# Patient Record
Sex: Female | Born: 1950
Health system: Southern US, Community
[De-identification: ages and names within clinical notes are randomized; demographics above are authoritative.]

## PROBLEM LIST (undated history)

## (undated) DIAGNOSIS — H269 Unspecified cataract: Secondary | ICD-10-CM

## (undated) DIAGNOSIS — I10 Essential (primary) hypertension: Secondary | ICD-10-CM

## (undated) DIAGNOSIS — K52839 Microscopic colitis, unspecified: Secondary | ICD-10-CM

## (undated) DIAGNOSIS — E785 Hyperlipidemia, unspecified: Secondary | ICD-10-CM

## (undated) DIAGNOSIS — C649 Malignant neoplasm of unspecified kidney, except renal pelvis: Secondary | ICD-10-CM

## (undated) DIAGNOSIS — K219 Gastro-esophageal reflux disease without esophagitis: Secondary | ICD-10-CM

## (undated) DIAGNOSIS — M7730 Calcaneal spur, unspecified foot: Secondary | ICD-10-CM

## (undated) DIAGNOSIS — M1990 Unspecified osteoarthritis, unspecified site: Secondary | ICD-10-CM

## (undated) DIAGNOSIS — M109 Gout, unspecified: Secondary | ICD-10-CM

## (undated) HISTORY — DX: Essential (primary) hypertension: I10

## (undated) HISTORY — DX: Hyperlipidemia, unspecified: E78.5

## (undated) HISTORY — PX: JOINT REPLACEMENT: SHX530

## (undated) HISTORY — PX: HERNIA REPAIR: SHX51

## (undated) HISTORY — PX: COSMETIC SURGERY: SHX468

## (undated) HISTORY — PX: OTHER SURGICAL HISTORY: SHX169

## (undated) HISTORY — DX: Unspecified osteoarthritis, unspecified site: M19.90

## (undated) HISTORY — DX: Malignant neoplasm of unspecified kidney, except renal pelvis: C64.9

## (undated) HISTORY — DX: Unspecified cataract: H26.9

## (undated) HISTORY — DX: Gastro-esophageal reflux disease without esophagitis: K21.9

## (undated) HISTORY — PX: BREAST SURGERY: SHX581

## (undated) HISTORY — DX: Microscopic colitis, unspecified: K52.839

## (undated) HISTORY — PX: APPENDECTOMY: SHX54

## (undated) HISTORY — PX: NEPHRECTOMY: SHX65

## (undated) HISTORY — DX: Gout, unspecified: M10.9

## (undated) HISTORY — PX: COLONOSCOPY: SHX174

## (undated) HISTORY — PX: UPPER GASTROINTESTINAL ENDOSCOPY: SHX188

## (undated) HISTORY — PX: EYE SURGERY: SHX253

## (undated) HISTORY — DX: Calcaneal spur, unspecified foot: M77.30

## (undated) HISTORY — PX: TUBAL LIGATION: SHX77

---
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
## 2001-07-07 ENCOUNTER — Inpatient Hospital Stay (HOSPITAL_COMMUNITY): Admission: AD | Admit: 2001-07-07 | Discharge: 2001-07-08 | Payer: Self-pay | Admitting: *Deleted

## 2008-09-14 DIAGNOSIS — C649 Malignant neoplasm of unspecified kidney, except renal pelvis: Secondary | ICD-10-CM

## 2008-09-14 HISTORY — DX: Malignant neoplasm of unspecified kidney, except renal pelvis: C64.9

## 2009-06-19 ENCOUNTER — Inpatient Hospital Stay (HOSPITAL_COMMUNITY): Admission: RE | Admit: 2009-06-19 | Discharge: 2009-06-21 | Payer: Self-pay | Admitting: Urology

## 2009-06-19 ENCOUNTER — Encounter: Payer: Self-pay | Admitting: Urology

## 2009-09-18 ENCOUNTER — Ambulatory Visit (HOSPITAL_COMMUNITY): Admission: RE | Admit: 2009-09-18 | Discharge: 2009-09-18 | Payer: Self-pay | Admitting: Urology

## 2009-12-18 ENCOUNTER — Ambulatory Visit (HOSPITAL_COMMUNITY): Admission: RE | Admit: 2009-12-18 | Discharge: 2009-12-18 | Payer: Self-pay | Admitting: Urology

## 2010-07-10 ENCOUNTER — Ambulatory Visit (HOSPITAL_COMMUNITY): Admission: RE | Admit: 2010-07-10 | Discharge: 2010-07-10 | Payer: Self-pay | Admitting: General Surgery

## 2010-11-26 LAB — DIFFERENTIAL
Basophils Absolute: 0.1 10*3/uL (ref 0.0–0.1)
Eosinophils Absolute: 0.1 10*3/uL (ref 0.0–0.7)
Eosinophils Relative: 2 % (ref 0–5)
Monocytes Absolute: 0.5 10*3/uL (ref 0.1–1.0)

## 2010-11-26 LAB — COMPREHENSIVE METABOLIC PANEL
ALT: 56 U/L — ABNORMAL HIGH (ref 0–35)
AST: 43 U/L — ABNORMAL HIGH (ref 0–37)
Albumin: 3.8 g/dL (ref 3.5–5.2)
Alkaline Phosphatase: 58 U/L (ref 39–117)
CO2: 29 mEq/L (ref 19–32)
Chloride: 104 mEq/L (ref 96–112)
GFR calc Af Amer: 54 mL/min — ABNORMAL LOW (ref >60)
GFR calc non Af Amer: 45 mL/min — ABNORMAL LOW (ref >60)
Potassium: 3.2 mEq/L — ABNORMAL LOW (ref 3.5–5.1)
Sodium: 138 mEq/L (ref 135–145)
Total Bilirubin: 0.9 mg/dL (ref 0.3–1.2)

## 2010-11-26 LAB — SURGICAL PCR SCREEN: Staphylococcus aureus: NEGATIVE

## 2010-11-26 LAB — CBC
Hemoglobin: 13.4 g/dL (ref 12.0–15.0)
MCH: 32.1 pg (ref 26.0–34.0)
Platelets: 259 10*3/uL (ref 150–400)
RBC: 4.17 MIL/uL (ref 3.87–5.11)
WBC: 6.3 10*3/uL (ref 4.0–10.5)

## 2010-12-18 LAB — BASIC METABOLIC PANEL
BUN: 10 mg/dL (ref 6–23)
Chloride: 104 mEq/L (ref 96–112)
Creatinine, Ser: 1.24 mg/dL — ABNORMAL HIGH (ref 0.4–1.2)
GFR calc Af Amer: 54 mL/min — ABNORMAL LOW (ref >60)
GFR calc non Af Amer: 44 mL/min — ABNORMAL LOW (ref >60)
Potassium: 4.1 mEq/L (ref 3.5–5.1)

## 2010-12-18 LAB — HEMOGLOBIN AND HEMATOCRIT, BLOOD
HCT: 33.5 % — ABNORMAL LOW (ref 36.0–46.0)
HCT: 35.3 % — ABNORMAL LOW (ref 36.0–46.0)
Hemoglobin: 11.7 g/dL — ABNORMAL LOW (ref 12.0–15.0)
Hemoglobin: 12.1 g/dL (ref 12.0–15.0)

## 2010-12-18 LAB — TYPE AND SCREEN: Antibody Screen: NEGATIVE

## 2010-12-19 LAB — CBC
Platelets: 251 10*3/uL (ref 150–400)
RDW: 13.9 % (ref 11.5–15.5)
WBC: 6 10*3/uL (ref 4.0–10.5)

## 2010-12-19 LAB — BASIC METABOLIC PANEL
BUN: 13 mg/dL (ref 6–23)
Calcium: 9.9 mg/dL (ref 8.4–10.5)
GFR calc non Af Amer: >60 mL/min (ref >60)
Glucose, Bld: 116 mg/dL — ABNORMAL HIGH (ref 70–99)
Potassium: 3.4 mEq/L — ABNORMAL LOW (ref 3.5–5.1)

## 2011-01-30 NOTE — Discharge Summary (Signed)
Kristie Lewis. [facility]  Patient:    Kristie Lewis, Kristie Lewis Visit Number: [id number] MRN: [id number]          Service Type: MED Location: [phone] Attending Physician:  Veneda Melter Dictated by:   Brita Romp, P.A. Admit Date:  07/07/2001 Discharge Date: 07/08/2001   CC:         M. Linna Darner, M.D., [place], [place]  [facility]   Discharge Summary  DISCHARGE DIAGNOSES: 1. Chest pain, status post cardiac catheterization. 2. Hyperlipidemia. 3. Labile hypertension. 4. History of trigeminy, treated with atenolol.  HISTORY OF PRESENT ILLNESS:  Kristie Lewis is a 60 year old female who was initially admitted by Jonelle Sidle, M.D., on July 06, 2001.  She subs had a stress test which was abnormal and she was referred to [facility]. [facility] for catheterization.  HOSPITAL COURSE:  On July 07, 2001, the patient was seen by Veneda Melter, M.D.  She had no chest pain or shortness of breath, as well as no nausea or vomiting.  In addition to the scheduled catheterization, he planned to recheck the patients lipids and replete her hypokalemia of 3.3.  The next day, the patient was taken to the catheterization laboratory by Veneda Melter, M.D.  Catheterization results:  1. Left main coronary artery:  Mild disease.  2. Left anterior descending:  Bifurcating D sub 1, small D sub 2, 10-20% lesion in the proximal to mid vessel.  3. Left circumflex:  Two x OM, 20% in the AV circumflex.  4. Right coronary artery:  Dominant; luminal irregularities.  5. Left ventricle:  Ejection fraction greater than 65%, no wall motion abnormalities or mitral regurgitation and lift.  Veneda Melter, M.D., felt the patients chest pain was noncoronary in origin and recommended further investigation for other causes of chest pain.  He noted that morning that her potassium was 3.1 and ordered additional repletion.  Following repletion, he felt that she was stable for discharge with  close follow-up by her primary care physician.  DISCHARGE MEDICATIONS: 1. Zocor 20 mg q.h.s. 2. Enteric-coated aspirin 325 mg q.d. 3. Atenolol as previously taken.  ACTIVITY:  The patient is to avoid driving, heavy lifting, or tub baths for 48 hours.  DIET:  She is to follow a low-fat, low-salt, low-cholesterol diet.  WOUND CARE:  She is to watch the catheterization site for any pain, bleeding, or swelling and to call the [place] office for any of these problems.  FOLLOW-UP:  She is to have a BMET drawn on Monday, July 11, 2001, with results going to M. Linna Darner, M.D.  She is to follow up with Dr. Linna Darner as needed or as scheduled.  LABORATORY VALUES:  Sodium 138, potassium pre repletion 3.1, chloride 102, CO2 28, BUN 11, creatinine 1.0, glucose 102. Dictated by:   Brita Romp, P.A. Attending Physician:  Veneda Melter DD:  07/08/01 TD:  07/11/01 Job: [id number]

## 2011-01-30 NOTE — Discharge Summary (Signed)
[place]. [facility]  Patient:    Kristie Lewis, Kristie Lewis Visit Number: [id number] MRN: [id number]          Service Type: MED Location: [phone] Attending Physician:  Veneda Melter Dictated by:   Brita Romp, P.A.C. Admit Date:  07/07/2001 Discharge Date: 07/08/2001                             Discharge Summary  ADDENDUM TO LAB RESULTS ON DISCHARGE SUMMARY #[id number]:  Fasting lipid panel was pending at time of discharge.  This will need to be retrieved from the [facility] system on or after July 09, 2001. Dictated by:   Brita Romp, P.A.C. Attending Physician:  Veneda Melter DD:  07/08/01 TD:  07/11/01 Job: [id number]

## 2011-01-30 NOTE — Cardiovascular Report (Signed)
[place]. [facility]  Patient:    Kristie Lewis, Kristie Lewis Visit Number: [id number] MRN: [id number]          Service Type: MED Location: [phone] Attending Physician:  Veneda Melter Dictated by:   Veneda Melter, M.D. Proc. Date: 07/08/01 Admit Date:  07/07/2001 Discharge Date: 07/08/2001   CC:         Lia Hopping, M.D., [facility]   Cardiac Catheterization  PROCEDURE: 1. Left heart catheterization 2. Left ventriculogram. 3. Selective coronary angiography. 4. Abdominal aortogram. 5. Perclose of right femoral artery.  DIAGNOSES: 1. Trivial coronary artery disease by angiogram. 2. Normal left ventricular systolic function.  CARDIOLOGIST:  Veneda Melter, M.D.  HISTORY:  Kristie Lewis is a 60 year old white female who presents with substernal chest discomfort.  The patient underwent stress test suggesting ischemia.  The patient had pain at rest and was admitted to the hospital.  She subsequently ruled out for acute myocardial infarction.  She was transferred from [facility] to [facility] for further cardiac assessment.  TECHNIQUE:  After informed consent was obtained, the patient was brought to the catheterization lab.  A 6-French sheath was placed in the right femoral artery.  Left heart catheterization and selective coronary angiography was then performed in the usual fashion using preformed 6-French Judkins catheters.  Left ventricular and abdominal aortogram were performed using power injection of contrast with pigtail catheter. At the termination of the case, a Perclose suture closure device was employed to the right femoral artery until adequate hemostasis was achieved.  The patient tolerated the procedure well and was transferred to the floor in stable condition.  FINDINGS: 1. Left main trunk: A medium caliber vessel with mild irregularities. 2. LAD.  This is a medium caliber vessel that provides a large    bifurcating first diagonal branch in the proximal  segment, a second    diagonal branch in the mid section.  There were mild irregularities of    10 to 20% in the proximal and mid LAD. 3. Left circumflex artery: This is a medium caliber vessel that provides a    first marginal branch at the proximal segment and a small second marginal    branch in the mid section.  There is mild disease of 20% in the A-V    circumflex. 4. Right coronary artery is dominant.  This is a medium caliber vessel that    provides a posterior descending artery and two posterior ventricular    branches in its terminal segment.   There are luminal irregularities in    the right coronary artery system.  LEFT VENTRICULOGRAPHY:  Normal end-systolic and end-diastolic dimensions. Overall left ventricular function well preserved, ejection fraction greater than 65%.  No mitral regurgitation.  LV pressure 100/0, aortic 100/60.  LV EDP equals 4.  ABDOMINAL AORTOGRAPHY: Abdominal aorta of normal caliber without significant atheromatous buildup.  The renal arteries are single and widely patent bilaterally.  The iliac arteries have no obstructive disease.  ASSESSMENT/PLAN:  Kristie Lewis is a 60 year old white female with noncritical coronary artery disease and well preserved left ventricular function. Continued medical therapy and aggressive risk factor modification will be pursued and other causes of chest pain investigated. Dictated by:   Veneda Melter, M.D. Attending Physician:  Veneda Melter DD:  07/08/01 TD:  07/10/01 Job: [id number]

## 2012-12-08 ENCOUNTER — Other Ambulatory Visit: Payer: Self-pay

## 2012-12-08 MED ORDER — POTASSIUM CHLORIDE CRYS ER 20 MEQ PO TBCR
20.0000 meq | EXTENDED_RELEASE_TABLET | Freq: Every day | ORAL | Status: DC
Start: 2012-12-08 — End: 2013-01-23

## 2012-12-09 ENCOUNTER — Telehealth: Payer: Self-pay | Admitting: Nurse Practitioner

## 2012-12-09 NOTE — Telephone Encounter (Signed)
[facility] was e-scribed to [facility] on 12/08/12. Pt aware

## 2013-01-02 ENCOUNTER — Other Ambulatory Visit: Payer: Self-pay

## 2013-01-02 MED ORDER — FUROSEMIDE 20 MG PO TABS: 20.0000 mg | ORAL_TABLET | Freq: Every day | ORAL | Status: DC

## 2013-01-23 ENCOUNTER — Ambulatory Visit (INDEPENDENT_AMBULATORY_CARE_PROVIDER_SITE_OTHER): Payer: BC Managed Care – PPO | Admitting: Nurse Practitioner

## 2013-01-23 ENCOUNTER — Encounter: Payer: Self-pay | Admitting: Nurse Practitioner

## 2013-01-23 VITALS — BP 116/60 | HR 56 | Temp 97.4°F | Ht 64.0 in | Wt 208.0 lb

## 2013-01-23 DIAGNOSIS — E785 Hyperlipidemia, unspecified: Secondary | ICD-10-CM | POA: Insufficient documentation

## 2013-01-23 DIAGNOSIS — E876 Hypokalemia: Secondary | ICD-10-CM | POA: Insufficient documentation

## 2013-01-23 DIAGNOSIS — K219 Gastro-esophageal reflux disease without esophagitis: Secondary | ICD-10-CM | POA: Insufficient documentation

## 2013-01-23 DIAGNOSIS — I10 Essential (primary) hypertension: Secondary | ICD-10-CM

## 2013-01-23 DIAGNOSIS — E1159 Type 2 diabetes mellitus with other circulatory complications: Secondary | ICD-10-CM | POA: Insufficient documentation

## 2013-01-23 MED ORDER — ATENOLOL 50 MG PO TABS
50.0000 mg | ORAL_TABLET | Freq: Every day | ORAL | Status: DC
Start: 2013-01-23 — End: 2013-07-28

## 2013-01-23 MED ORDER — POTASSIUM CHLORIDE CRYS ER 20 MEQ PO TBCR
20.0000 meq | EXTENDED_RELEASE_TABLET | Freq: Every day | ORAL | Status: DC
Start: 2013-01-23 — End: 2013-07-28

## 2013-01-23 MED ORDER — FUROSEMIDE 20 MG PO TABS: 20.0000 mg | ORAL_TABLET | Freq: Every day | ORAL | Status: DC

## 2013-01-23 MED ORDER — PANTOPRAZOLE SODIUM 40 MG PO TBEC: 40.0000 mg | DELAYED_RELEASE_TABLET | Freq: Every day | ORAL | Status: DC

## 2013-01-23 MED ORDER — PITAVASTATIN CALCIUM 2 MG PO TABS: 2.0000 mg | ORAL_TABLET | Freq: Every day | ORAL | Status: DC

## 2013-01-23 NOTE — Progress Notes (Signed)
Subjective:    Patient ID: Kristie Lewis, female    DOB: Mar 09, 1951, 62 y.o.   MRN: [id number]  Hypertension This is a chronic problem. The current episode started more than 1 year ago. The problem is unchanged. The problem is controlled. Pertinent negatives include no anxiety (has resolved , patient has retired.). There are no associated agents to hypertension. Risk factors for coronary artery disease include dyslipidemia, post-menopausal state and obesity. Past treatments include beta blockers and diuretics. The current treatment provides significant improvement. Compliance problems include diet and exercise.   Hyperlipidemia This is a chronic problem. The current episode started more than 1 year ago. The problem is controlled. Recent lipid tests were reviewed and are normal. Exacerbating diseases include obesity. Factors aggravating her hyperlipidemia include beta blockers. Pertinent negatives include no focal sensory loss, focal weakness, leg pain or myalgias. The current treatment provides significant improvement of lipids. There are no compliance problems.  Risk factors for coronary artery disease include post-menopausal, obesity and hypertension.  Gastrophageal Reflux She reports no choking, no coughing, no dysphagia, no heartburn or no tooth decay. This is a chronic problem. The current episode started more than 1 year ago. The problem occurs rarely. The problem has been waxing and waning. Nothing aggravates the symptoms. Pertinent negatives include no fatigue, muscle weakness or orthopnea. Risk factors include obesity. She has tried a PPI for the symptoms. The treatment provided no relief.  Hypokalemia Potassium daily- No c/o lower ext cramping Peripheral edema Lasix works well too keep swelling under control    Review of Systems  Constitutional: Negative for fatigue.  Respiratory: Negative for cough and choking.   Gastrointestinal: Negative for heartburn and dysphagia.   Musculoskeletal: Negative for myalgias and muscle weakness.  Neurological: Negative for focal weakness.  All other systems reviewed and are negative.       Objective:   Physical Exam  Constitutional: She is oriented to person, place, and time. She appears well-developed and well-nourished.  HENT:  Nose: Nose normal.  Mouth/Throat: Oropharynx is clear and moist.  Eyes: EOM are normal.  Neck: Trachea normal, normal range of motion and full passive range of motion without pain. Neck supple. No JVD present. Carotid bruit is not present. No thyromegaly present.  Cardiovascular: Normal rate, regular rhythm, normal heart sounds and intact distal pulses.  Exam reveals no gallop and no friction rub.   No murmur heard. Pulmonary/Chest: Effort normal and breath sounds normal.  Abdominal: Soft. Bowel sounds are normal. She exhibits no distension and no mass. There is no tenderness.  Musculoskeletal: Normal range of motion.  Lymphadenopathy:    She has no cervical adenopathy.  Neurological: She is alert and oriented to person, place, and time. She has normal reflexes.  Skin: Skin is warm and dry.  Psychiatric: She has a normal mood and affect. Her behavior is normal. Judgment and thought content normal.   BP 116/60  Pulse 56  Temp(Src) 97.4 F (36.3 C) (Oral)  Ht 5\' 4"  (1.626 m)  Wt 208 lb (94.348 kg)  BMI 35.69 kg/m2        Assessment & Plan:  1. Hypertension Low NA+ diet - furosemide (LASIX) 20 MG tablet; Take 1 tablet (20 mg total) by mouth daily.  Dispense: 90 tablet; Refill: 3 - atenolol (TENORMIN) 50 MG tablet; Take 1 tablet (50 mg total) by mouth daily.  Dispense: 90 tablet; Refill: 3  2. Hyperlipidemia Low fat diet an dexercise - Pitavastatin Calcium (LIVALO) 2 MG TABS; Take 1 tablet (  2 mg total) by mouth daily. Takes 1/2qd  Dispense: 90 tablet; Refill: 3  3. GERD (gastroesophageal reflux disease) Avoid spicy and fatty foods - pantoprazole (PROTONIX) 40 MG tablet; Take  1 tablet (40 mg total) by mouth daily.  Dispense: 90 tablet; Refill: 3  4. Hypokalemia  - potassium chloride SA (K-DUR,KLOR-CON) 20 MEQ tablet; Take 1 tablet (20 mEq total) by mouth daily.  Dispense: 90 tablet; Refill: 3  Labs reviewed at appointment  Mary-Margaret Daphine Deutscher, FNP

## 2013-01-23 NOTE — Patient Instructions (Signed)

## 2013-07-28 ENCOUNTER — Encounter: Payer: Self-pay | Admitting: Nurse Practitioner

## 2013-07-28 ENCOUNTER — Ambulatory Visit (INDEPENDENT_AMBULATORY_CARE_PROVIDER_SITE_OTHER): Payer: BC Managed Care – PPO | Admitting: Nurse Practitioner

## 2013-07-28 VITALS — BP 106/53 | HR 59 | Temp 98.1°F | Ht 64.0 in | Wt 216.0 lb

## 2013-07-28 DIAGNOSIS — E876 Hypokalemia: Secondary | ICD-10-CM

## 2013-07-28 DIAGNOSIS — K219 Gastro-esophageal reflux disease without esophagitis: Secondary | ICD-10-CM

## 2013-07-28 DIAGNOSIS — E785 Hyperlipidemia, unspecified: Secondary | ICD-10-CM

## 2013-07-28 DIAGNOSIS — I10 Essential (primary) hypertension: Secondary | ICD-10-CM

## 2013-07-28 LAB — POCT CBC
Lymph, poc: 2.3 (ref 0.6–3.4)
MCH, POC: 29.9 pg (ref 27–31.2)
MCHC: 33.2 g/dL (ref 31.8–35.4)
MPV: 7.6 fL (ref 0–99.8)
POC Granulocyte: 3.6 (ref 2–6.9)
POC LYMPH PERCENT: 36.7 %L (ref 10–50)
Platelet Count, POC: 208 10*3/uL (ref 142–424)
RDW, POC: 14 %
WBC: 6.4 10*3/uL (ref 4.6–10.2)

## 2013-07-28 MED ORDER — POTASSIUM CHLORIDE CRYS ER 20 MEQ PO TBCR: 20.0000 meq | EXTENDED_RELEASE_TABLET | Freq: Every day | ORAL | Status: DC

## 2013-07-28 MED ORDER — ATENOLOL 50 MG PO TABS: 50.0000 mg | ORAL_TABLET | Freq: Every day | ORAL | Status: DC

## 2013-07-28 MED ORDER — PANTOPRAZOLE SODIUM 40 MG PO TBEC: 40.0000 mg | DELAYED_RELEASE_TABLET | Freq: Every day | ORAL | Status: DC

## 2013-07-28 MED ORDER — PITAVASTATIN CALCIUM 2 MG PO TABS: 2.0000 mg | ORAL_TABLET | Freq: Every day | ORAL | Status: DC

## 2013-07-28 MED ORDER — FUROSEMIDE 20 MG PO TABS: 20.0000 mg | ORAL_TABLET | Freq: Every day | ORAL | Status: DC

## 2013-07-28 NOTE — Progress Notes (Signed)
Subjective:    Patient ID: Kristie Lewis, female    DOB: Mar 04, 1951, 62 y.o.   MRN: [id number]  Hypertension This is a chronic problem. The current episode started more than 1 year ago. The problem is unchanged. The problem is controlled. Pertinent negatives include no anxiety (has resolved , patient has retired.). There are no associated agents to hypertension. Risk factors for coronary artery disease include dyslipidemia, post-menopausal state and obesity. Past treatments include beta blockers and diuretics. The current treatment provides significant improvement. Compliance problems include diet and exercise.   Hyperlipidemia This is a chronic problem. The current episode started more than 1 year ago. The problem is controlled. Recent lipid tests were reviewed and are normal. Exacerbating diseases include obesity. Factors aggravating her hyperlipidemia include beta blockers. Pertinent negatives include no focal sensory loss, focal weakness, leg pain or myalgias. The current treatment provides significant improvement of lipids. There are no compliance problems.  Risk factors for coronary artery disease include post-menopausal, obesity and hypertension.  Gastrophageal Reflux She reports no choking, no coughing, no dysphagia, no heartburn or no tooth decay. This is a chronic problem. The current episode started more than 1 year ago. The problem occurs rarely. The problem has been waxing and waning. Nothing aggravates the symptoms. Pertinent negatives include no fatigue, muscle weakness or orthopnea. Risk factors include obesity. She has tried a PPI for the symptoms. The treatment provided no relief.  Hypokalemia Potassium daily- No c/o lower ext cramping Peripheral edema Lasix works well too keep swelling under control    Review of Systems  Constitutional: Negative for fatigue.  Respiratory: Negative for cough and choking.   Gastrointestinal: Negative for heartburn and dysphagia.   Musculoskeletal: Negative for muscle weakness and myalgias.  Neurological: Negative for focal weakness.  All other systems reviewed and are negative.       Objective:   Physical Exam  Constitutional: She is oriented to person, place, and time. She appears well-developed and well-nourished.  HENT:  Nose: Nose normal.  Mouth/Throat: Oropharynx is clear and moist.  Eyes: EOM are normal.  Neck: Trachea normal, normal range of motion and full passive range of motion without pain. Neck supple. No JVD present. Carotid bruit is not present. No thyromegaly present.  Cardiovascular: Normal rate, regular rhythm, normal heart sounds and intact distal pulses.  Exam reveals no gallop and no friction rub.   No murmur heard. Pulmonary/Chest: Effort normal and breath sounds normal.  Abdominal: Soft. Bowel sounds are normal. She exhibits no distension and no mass. There is no tenderness.  Musculoskeletal: Normal range of motion.  Lymphadenopathy:    She has no cervical adenopathy.  Neurological: She is alert and oriented to person, place, and time. She has normal reflexes.  Skin: Skin is warm and dry.  Psychiatric: She has a normal mood and affect. Her behavior is normal. Judgment and thought content normal.   BP 106/53  Pulse 59  Temp(Src) 98.1 F (36.7 C) (Oral)  Ht 5\' 4"  (1.626 m)  Wt 216 lb (97.977 kg)  BMI 37.06 kg/m2        Assessment & Plan:   1. Hypertension   2. Hyperlipidemia   3. GERD (gastroesophageal reflux disease)   4. Hypokalemia    Orders Placed This Encounter  Procedures  . CMP14+EGFR  . NMR, lipoprofile  . POCT CBC   Meds ordered this encounter  Medications  . atenolol (TENORMIN) 50 MG tablet    Sig: Take 1 tablet (50 mg total) by mouth  daily.    Dispense:  90 tablet    Refill:  3    Order Specific Question:  Supervising Provider    Answer:  Ernestina Penna [[id number]]  . pantoprazole (PROTONIX) 40 MG tablet    Sig: Take 1 tablet (40 mg total) by mouth daily.     Dispense:  90 tablet    Refill:  3    Order Specific Question:  Supervising Provider    Answer:  Ernestina Penna [[id number]]  . furosemide (LASIX) 20 MG tablet    Sig: Take 1 tablet (20 mg total) by mouth daily.    Dispense:  90 tablet    Refill:  3    Order Specific Question:  Supervising Provider    Answer:  Ernestina Penna [[id number]]  . potassium chloride SA (K-DUR,KLOR-CON) 20 MEQ tablet    Sig: Take 1 tablet (20 mEq total) by mouth daily.    Dispense:  90 tablet    Refill:  3    Order Specific Question:  Supervising Provider    Answer:  Ernestina Penna [[id number]]  . Pitavastatin Calcium (LIVALO) 2 MG TABS    Sig: Take 1 tablet (2 mg total) by mouth daily. Takes 1/2qd    Dispense:  90 tablet    Refill:  3    Order Specific Question:  Supervising Provider    Answer:  Ernestina Penna [[id number]]    Continue all meds Labs pending Diet and exercise encouraged Health maintenance reviewed Follow up in 3 months hemocult cards given  Mary-Margaret Daphine Deutscher, FNP

## 2013-07-28 NOTE — Patient Instructions (Signed)

## 2013-07-30 LAB — CMP14+EGFR
ALT: 36 IU/L — ABNORMAL HIGH (ref 0–32)
AST: 25 IU/L (ref 0–40)
Albumin/Globulin Ratio: 1.7 (ref 1.1–2.5)
CO2: 24 mmol/L (ref 18–29)
Calcium: 9.9 mg/dL (ref 8.6–10.2)
Chloride: 104 mmol/L (ref 97–108)
GFR calc non Af Amer: 48 mL/min/{1.73_m2} — ABNORMAL LOW (ref >59)
Glucose: 126 mg/dL — ABNORMAL HIGH (ref 65–99)
Potassium: 4.9 mmol/L (ref 3.5–5.2)
Sodium: 143 mmol/L (ref 134–144)
Total Protein: 6.7 g/dL (ref 6.0–8.5)

## 2013-07-30 LAB — NMR, LIPOPROFILE
HDL Cholesterol by NMR: 44 mg/dL (ref >=40)
LDL Particle Number: 2388 nmol/L — ABNORMAL HIGH (ref <1000)
LDL Size: 20.5 nm — ABNORMAL LOW (ref >20.5)
Small LDL Particle Number: 1066 nmol/L — ABNORMAL HIGH (ref <=527)

## 2013-08-02 LAB — POCT GLYCOSYLATED HEMOGLOBIN (HGB A1C): Hemoglobin A1C: 5.6

## 2013-08-02 NOTE — Addendum Note (Signed)
Addended by: Lisbeth Ply C on: 08/02/2013 09:05 AM   Modules accepted: Orders

## 2013-10-17 ENCOUNTER — Other Ambulatory Visit: Payer: BC Managed Care – PPO

## 2013-10-17 DIAGNOSIS — Z1212 Encounter for screening for malignant neoplasm of rectum: Secondary | ICD-10-CM

## 2013-10-18 LAB — FECAL OCCULT BLOOD, IMMUNOCHEMICAL: Fecal Occult Bld: NEGATIVE

## 2013-12-26 ENCOUNTER — Ambulatory Visit (HOSPITAL_COMMUNITY)
Admission: RE | Admit: 2013-12-26 | Discharge: 2013-12-26 | Disposition: A | Discharge status: Home / Self Care | Payer: BC Managed Care – PPO | Source: Ambulatory Visit | Attending: Urology | Admitting: Urology

## 2013-12-26 ENCOUNTER — Other Ambulatory Visit: Payer: Self-pay | Admitting: Urology

## 2013-12-26 DIAGNOSIS — C649 Malignant neoplasm of unspecified kidney, except renal pelvis: Secondary | ICD-10-CM | POA: Insufficient documentation

## 2013-12-26 DIAGNOSIS — I10 Essential (primary) hypertension: Secondary | ICD-10-CM | POA: Insufficient documentation

## 2013-12-26 DIAGNOSIS — K449 Diaphragmatic hernia without obstruction or gangrene: Secondary | ICD-10-CM | POA: Insufficient documentation

## 2014-01-02 ENCOUNTER — Ambulatory Visit (INDEPENDENT_AMBULATORY_CARE_PROVIDER_SITE_OTHER): Payer: BC Managed Care – PPO | Admitting: Urology

## 2014-01-02 DIAGNOSIS — C649 Malignant neoplasm of unspecified kidney, except renal pelvis: Secondary | ICD-10-CM

## 2014-03-16 ENCOUNTER — Other Ambulatory Visit: Payer: Self-pay | Admitting: Nurse Practitioner

## 2014-03-19 NOTE — Telephone Encounter (Signed)
Last seen 07/28/14  MMM

## 2014-03-19 NOTE — Telephone Encounter (Signed)
Patient NTBS for follow up and lab work  

## 2014-05-24 ENCOUNTER — Encounter: Payer: Self-pay | Admitting: Nurse Practitioner

## 2014-05-24 ENCOUNTER — Ambulatory Visit (INDEPENDENT_AMBULATORY_CARE_PROVIDER_SITE_OTHER): Payer: BC Managed Care – PPO | Admitting: Nurse Practitioner

## 2014-05-24 VITALS — BP 109/65 | HR 58 | Temp 98.5°F | Ht 64.0 in | Wt 208.6 lb

## 2014-05-24 DIAGNOSIS — I10 Essential (primary) hypertension: Secondary | ICD-10-CM

## 2014-05-24 DIAGNOSIS — Z713 Dietary counseling and surveillance: Secondary | ICD-10-CM

## 2014-05-24 DIAGNOSIS — E876 Hypokalemia: Secondary | ICD-10-CM

## 2014-05-24 DIAGNOSIS — E785 Hyperlipidemia, unspecified: Secondary | ICD-10-CM

## 2014-05-24 DIAGNOSIS — K219 Gastro-esophageal reflux disease without esophagitis: Secondary | ICD-10-CM

## 2014-05-24 DIAGNOSIS — Z6835 Body mass index (BMI) 35.0-35.9, adult: Secondary | ICD-10-CM

## 2014-05-24 MED ORDER — POTASSIUM CHLORIDE CRYS ER 20 MEQ PO TBCR: 20.0000 meq | EXTENDED_RELEASE_TABLET | Freq: Every day | ORAL | Status: DC

## 2014-05-24 MED ORDER — ATENOLOL 50 MG PO TABS: 50.0000 mg | ORAL_TABLET | Freq: Every day | ORAL | Status: DC

## 2014-05-24 MED ORDER — FUROSEMIDE 20 MG PO TABS
20.0000 mg | ORAL_TABLET | Freq: Every day | ORAL | Status: DC
Start: 2014-05-24 — End: 2015-04-19

## 2014-05-24 MED ORDER — PANTOPRAZOLE SODIUM 40 MG PO TBEC: 40.0000 mg | DELAYED_RELEASE_TABLET | Freq: Every day | ORAL | Status: DC

## 2014-05-24 NOTE — Progress Notes (Signed)
Subjective:    Patient ID: Kristie Lewis, female    DOB: [birth year], 63 y.o.   MRN: [id number]   HPI Patient here for a follow up for chronic medical problems.  No issues at this current time.  No longer is taking Livalo.  Hypertension This is a chronic problem. The current episode started more than 1 year ago. The problem is unchanged. The problem is controlled. Pertinent negatives include no anxiety (has resolved , patient has retired.). There are no associated agents to hypertension. Risk factors for coronary artery disease include dyslipidemia, post-menopausal state and obesity. Past treatments include beta blockers and diuretics. The current treatment provides significant improvement. Compliance problems include diet and exercise.   Hyperlipidemia This is a chronic problem. The current episode started more than 1 year ago. The problem is controlled. Recent lipid tests were reviewed and are normal. Exacerbating diseases include obesity. Factors aggravating her hyperlipidemia include beta blockers. Pertinent negatives include no focal sensory loss, focal weakness, leg pain or myalgias. The current treatment provides significant improvement of lipids. There are no compliance problems.  Risk factors for coronary artery disease include post-menopausal, obesity and hypertension.  Gastrophageal Reflux She reports no choking, no coughing, no dysphagia, no heartburn or no tooth decay. This is a chronic problem. The current episode started more than 1 year ago. The problem occurs rarely. The problem has been waxing and waning. Nothing aggravates the symptoms. Pertinent negatives include no fatigue, muscle weakness or orthopnea. Risk factors include obesity. She has tried a PPI for the symptoms. The treatment provided no relief.  Hypokalemia Potassium daily- No c/o lower ext cramping Peripheral edema Lasix works well too keep swelling under control    Review of Systems  Constitutional: Negative for  fatigue.  Respiratory: Negative for cough and choking.   Gastrointestinal: Negative for heartburn and dysphagia.  Musculoskeletal: Negative for muscle weakness and myalgias.  Skin: Negative.   Neurological: Negative for focal weakness.  Psychiatric/Behavioral: Negative.   All other systems reviewed and are negative.      Objective:   Physical Exam  Constitutional: She is oriented to person, place, and time. She appears well-developed and well-nourished.  HENT:  Nose: Nose normal.  Mouth/Throat: Oropharynx is clear and moist.  Eyes: EOM are normal.  Neck: Trachea normal, normal range of motion and full passive range of motion without pain. Neck supple. No JVD present. Carotid bruit is not present. No thyromegaly present.  Cardiovascular: Normal rate, regular rhythm, normal heart sounds and intact distal pulses.  Exam reveals no gallop and no friction rub.   No murmur heard. Pulmonary/Chest: Effort normal and breath sounds normal.  Abdominal: Soft. Bowel sounds are normal. She exhibits no distension and no mass. There is no tenderness.  Musculoskeletal: Normal range of motion. She exhibits edema (1+ pitting bil lower ext).  Lymphadenopathy:    She has no cervical adenopathy.  Neurological: She is alert and oriented to person, place, and time. She has normal reflexes.  Skin: Skin is warm and dry.  Psychiatric: She has a normal mood and affect. Her behavior is normal. Judgment and thought content normal.   BP 109/65  Pulse 58  Temp(Src) 98.5 F (36.9 C) (Oral)  Ht '5\' 4"'  (1.626 m)  Wt 208 lb 9.6 oz (94.62 kg)  BMI 35.79 kg/m2        Assessment & Plan:  . 1. Hypokalemia   2. Essential hypertension   3. Hyperlipidemia   4. Gastroesophageal reflux disease without esophagitis  5. BMI 35.0-35.9,adult   6. Weight loss counseling, encounter for    Orders Placed This Encounter  Procedures  . CMP14+EGFR  . NMR, lipoprofile   Meds ordered this encounter  Medications  .  atenolol (TENORMIN) 50 MG tablet    Sig: Take 1 tablet (50 mg total) by mouth daily.    Dispense:  90 tablet    Refill:  3    Order Specific Question:  Supervising Provider    Answer:  Chipper Herb [[id number]]  . furosemide (LASIX) 20 MG tablet    Sig: Take 1 tablet (20 mg total) by mouth daily.    Dispense:  90 tablet    Refill:  3    Order Specific Question:  Supervising Provider    Answer:  Chipper Herb [[id number]]  . pantoprazole (PROTONIX) 40 MG tablet    Sig: Take 1 tablet (40 mg total) by mouth daily.    Dispense:  90 tablet    Refill:  3    Order Specific Question:  Supervising Provider    Answer:  Chipper Herb [[id number]]  . potassium chloride SA (K-DUR,KLOR-CON) 20 MEQ tablet    Sig: Take 1 tablet (20 mEq total) by mouth daily.    Dispense:  90 tablet    Refill:  3    Order Specific Question:  Supervising Provider    Answer:  Chipper Herb [[id number]]   Patient refuses to take a STATIN of any kind Discussed weight management for patient with BMI> 25 Labs pending Health maintenance reviewed Diet and exercise encouraged Continue all meds Follow up  In 3 months   [place], FNP

## 2014-05-24 NOTE — Patient Instructions (Signed)

## 2014-05-25 LAB — CMP14+EGFR
ALBUMIN: 4.3 g/dL (ref 3.6–4.8)
ALK PHOS: 78 IU/L (ref 39–117)
ALT: 45 IU/L — ABNORMAL HIGH (ref 0–32)
AST: 36 IU/L (ref 0–40)
Albumin/Globulin Ratio: 1.6 (ref 1.1–2.5)
BILIRUBIN TOTAL: 0.8 mg/dL (ref 0.0–1.2)
BUN / CREAT RATIO: 14 (ref 11–26)
BUN: 17 mg/dL (ref 8–27)
CO2: 24 mmol/L (ref 18–29)
Calcium: 10.1 mg/dL (ref 8.7–10.3)
Chloride: 99 mmol/L (ref 97–108)
Creatinine, Ser: 1.22 mg/dL — ABNORMAL HIGH (ref 0.57–1.00)
GFR, EST AFRICAN AMERICAN: 54 mL/min/{1.73_m2} — AB (ref >59)
GFR, EST NON AFRICAN AMERICAN: 47 mL/min/{1.73_m2} — AB (ref >59)
Globulin, Total: 2.7 g/dL (ref 1.5–4.5)
Glucose: 116 mg/dL — ABNORMAL HIGH (ref 65–99)
Potassium: 4.8 mmol/L (ref 3.5–5.2)
Sodium: 138 mmol/L (ref 134–144)
TOTAL PROTEIN: 7 g/dL (ref 6.0–8.5)

## 2014-05-25 LAB — NMR, LIPOPROFILE
CHOLESTEROL: 339 mg/dL — AB (ref 100–199)
HDL Cholesterol by NMR: 42 mg/dL (ref >39)
HDL Particle Number: 28.1 umol/L — ABNORMAL LOW (ref >=30.5)
LDL Particle Number: 3139 nmol/L — ABNORMAL HIGH (ref <1000)
LDL SIZE: 20.2 nm (ref >20.5)
LDLC SERPL CALC-MCNC: 262 mg/dL — ABNORMAL HIGH (ref 0–99)
LP-IR Score: 74 — ABNORMAL HIGH (ref <=45)
SMALL LDL PARTICLE NUMBER: 1785 nmol/L — AB (ref <=527)
Triglycerides by NMR: 177 mg/dL — ABNORMAL HIGH (ref 0–149)

## 2014-08-06 ENCOUNTER — Other Ambulatory Visit: Payer: BC Managed Care – PPO

## 2014-08-06 NOTE — Progress Notes (Signed)
Lab only 

## 2014-08-13 LAB — FECAL OCCULT BLOOD, IMMUNOCHEMICAL: Fecal Occult Bld: NEGATIVE

## 2015-01-02 ENCOUNTER — Ambulatory Visit (HOSPITAL_COMMUNITY)
Admission: RE | Admit: 2015-01-02 | Discharge: 2015-01-02 | Disposition: A | Discharge status: Home / Self Care | Payer: BLUE CROSS/BLUE SHIELD | Source: Ambulatory Visit | Attending: Urology | Admitting: Urology

## 2015-01-02 ENCOUNTER — Other Ambulatory Visit: Payer: Self-pay | Admitting: Urology

## 2015-01-02 DIAGNOSIS — I10 Essential (primary) hypertension: Secondary | ICD-10-CM | POA: Insufficient documentation

## 2015-01-02 DIAGNOSIS — Z85528 Personal history of other malignant neoplasm of kidney: Secondary | ICD-10-CM | POA: Insufficient documentation

## 2015-01-02 DIAGNOSIS — C649 Malignant neoplasm of unspecified kidney, except renal pelvis: Secondary | ICD-10-CM

## 2015-01-08 ENCOUNTER — Ambulatory Visit (INDEPENDENT_AMBULATORY_CARE_PROVIDER_SITE_OTHER): Payer: BLUE CROSS/BLUE SHIELD | Admitting: Urology

## 2015-01-08 DIAGNOSIS — C649 Malignant neoplasm of unspecified kidney, except renal pelvis: Secondary | ICD-10-CM

## 2015-02-06 ENCOUNTER — Encounter: Payer: Self-pay | Admitting: Nurse Practitioner

## 2015-04-19 ENCOUNTER — Ambulatory Visit (INDEPENDENT_AMBULATORY_CARE_PROVIDER_SITE_OTHER): Payer: BLUE CROSS/BLUE SHIELD | Admitting: Family Medicine

## 2015-04-19 ENCOUNTER — Encounter: Payer: Self-pay | Admitting: Family Medicine

## 2015-04-19 VITALS — BP 108/66 | HR 54 | Temp 97.0°F | Ht 64.0 in | Wt 205.4 lb

## 2015-04-19 DIAGNOSIS — E785 Hyperlipidemia, unspecified: Secondary | ICD-10-CM

## 2015-04-19 DIAGNOSIS — E876 Hypokalemia: Secondary | ICD-10-CM | POA: Diagnosis not present

## 2015-04-19 DIAGNOSIS — K219 Gastro-esophageal reflux disease without esophagitis: Secondary | ICD-10-CM

## 2015-04-19 DIAGNOSIS — I10 Essential (primary) hypertension: Secondary | ICD-10-CM | POA: Diagnosis not present

## 2015-04-19 LAB — POCT CBC
Granulocyte percent: 51.9 %G (ref 37–80)
HCT, POC: 43.6 % (ref 37.7–47.9)
Hemoglobin: 13.8 g/dL (ref 12.2–16.2)
Lymph, poc: 2.1 (ref 0.6–3.4)
MCH, POC: 28.7 pg (ref 27–31.2)
MCHC: 31.6 g/dL — AB (ref 31.8–35.4)
MCV: 90.6 fL (ref 80–97)
MPV: 7 fL (ref 0–99.8)
POC Granulocyte: 2.9 (ref 2–6.9)
POC LYMPH PERCENT: 38.7 %L (ref 10–50)
Platelet Count, POC: 242 10*3/uL (ref 142–424)
RBC: 4.81 M/uL (ref 4.04–5.48)
RDW, POC: 13.6 %
WBC: 5.5 10*3/uL (ref 4.6–10.2)

## 2015-04-19 MED ORDER — PANTOPRAZOLE SODIUM 40 MG PO TBEC: 40.0000 mg | DELAYED_RELEASE_TABLET | Freq: Every day | ORAL | Status: DC

## 2015-04-19 MED ORDER — FUROSEMIDE 20 MG PO TABS: 20.0000 mg | ORAL_TABLET | Freq: Every day | ORAL | Status: DC

## 2015-04-19 MED ORDER — ATENOLOL 25 MG PO TABS: 25.0000 mg | ORAL_TABLET | Freq: Every day | ORAL | Status: DC

## 2015-04-19 MED ORDER — POTASSIUM CHLORIDE CRYS ER 20 MEQ PO TBCR: 20.0000 meq | EXTENDED_RELEASE_TABLET | Freq: Every day | ORAL | Status: DC

## 2015-04-19 MED ORDER — ATENOLOL 25 MG PO TABS: 50.0000 mg | ORAL_TABLET | Freq: Every day | ORAL | Status: DC

## 2015-04-19 MED ORDER — ATENOLOL 50 MG PO TABS: 50.0000 mg | ORAL_TABLET | Freq: Every day | ORAL | Status: DC

## 2015-04-19 NOTE — Assessment & Plan Note (Signed)
Does not watch diet Does not want to take any statins, she has tried and failed Crestor, Lipitor, simvastatin, and pravastatin. She states that she has myalgias. I also offered your therapies which she declines, she does have family members which use these.

## 2015-04-19 NOTE — Progress Notes (Signed)
Patient ID: Kristie Lewis, female   DOB: 10/03/1950, 64 y.o.   MRN: [id number]   HPI  Patient presents today for follow-up chronic medical conditions  Hypertension Checks blood pressure at home occasionally she states that it has reached the 160s She states occasionally she gets into the diastolic 81X and feels weak She takes noted everyday She does not watch her diet or exercise  Hyperlipidemia Not watching her diet, has tried several statins in the past and refuses to try more. She states that she's tried and had myalgias from Crestor, Lipitor, simvastatin, and pravastatin GERD-well-controlled on PPI, good compliance  Swelling-has been on Lasix for quite some time for this, we discussed the risk of nephrotoxicity or dehydration considering that she's had a nephrectomy due to renal cell carcinoma previously. I believe she previously had hypokalemia due to HCTZ and she would like to continue Lasix.  PMH: Smoking status noted ROS: Per HPI  Objective: BP 108/66 mmHg  Pulse 54  Temp(Src) 97 F (36.1 C) (Oral)  Ht _0  (1.626 m)  Wt 205 lb 6.4 oz (93.169 kg)  BMI 35.24 kg/m2 Gen: NAD, alert, cooperative with exam HEENT: NCAT,TMs WNL BL, nares clear, oropharynx clear  CV: RRR, good S1/S2, no murmur Resp: CTABL, no wheezes, non-labored Abd: SNTND, BS present, no guarding or organomegaly, Umbilical scar well healed  Ext: No edema, warm Neuro: Alert and oriented, No gross deficits, Normal gait Skin: No rash, umbilical scar as above  Assessment and plan:  Hypokalemia Labs today, likely resolved as last value was 4.8  Hypertension Well-controlled, decrease atenolol to 25 mg with some symptomatic hypotension and pulse of 54 today Continue Lasix, did discuss risk of nephrotoxicity with dehydration and her single kidney.  Hyperlipidemia Does not watch diet Does not want to take any statins, she has tried and failed Crestor, Lipitor, simvastatin, and pravastatin. She states that  she has myalgias. I also offered your therapies which she declines, she does have family members which use these.  GERD (gastroesophageal reflux disease) Well-controlled on PPI-  continue   Orders Placed This Encounter  Procedures  . Lipid Panel  . CMP14+EGFR  . TSH  . POCT CBC    Meds ordered this encounter  Medications  . atenolol (TENORMIN) 50 MG tablet    Sig: Take 1 tablet (50 mg total) by mouth daily.    Dispense:  90 tablet    Refill:  3  . furosemide (LASIX) 20 MG tablet    Sig: Take 1 tablet (20 mg total) by mouth daily.    Dispense:  90 tablet    Refill:  3  . pantoprazole (PROTONIX) 40 MG tablet    Sig: Take 1 tablet (40 mg total) by mouth daily.    Dispense:  90 tablet    Refill:  3  . potassium chloride SA (K-DUR,KLOR-CON) 20 MEQ tablet    Sig: Take 1 tablet (20 mEq total) by mouth daily.    Dispense:  90 tablet    Refill:  3

## 2015-04-19 NOTE — Assessment & Plan Note (Signed)
Well-controlled on PPI-  continue

## 2015-04-19 NOTE — Assessment & Plan Note (Signed)
Well-controlled, decrease atenolol to 25 mg with some symptomatic hypotension and pulse of 54 today Continue Lasix, did discuss risk of nephrotoxicity with dehydration and her single kidney.

## 2015-04-19 NOTE — Assessment & Plan Note (Signed)
Labs today, likely resolved as last value was 4.8

## 2015-04-19 NOTE — Patient Instructions (Signed)
Great to meet you!  Come back in 6 months (to recheck your blood pressure)

## 2015-04-20 LAB — CMP14+EGFR
ALT: 44 IU/L — ABNORMAL HIGH (ref 0–32)
AST: 32 IU/L (ref 0–40)
Albumin/Globulin Ratio: 1.4 (ref 1.1–2.5)
Albumin: 4.2 g/dL (ref 3.6–4.8)
Alkaline Phosphatase: 67 IU/L (ref 39–117)
BUN/Creatinine Ratio: 15 (ref 11–26)
BUN: 17 mg/dL (ref 8–27)
Bilirubin Total: 0.8 mg/dL (ref 0.0–1.2)
CO2: 20 mmol/L (ref 18–29)
Calcium: 10 mg/dL (ref 8.7–10.3)
Chloride: 102 mmol/L (ref 97–108)
Creatinine, Ser: 1.17 mg/dL — ABNORMAL HIGH (ref 0.57–1.00)
GFR calc Af Amer: 57 mL/min/1.73 — ABNORMAL LOW
GFR calc non Af Amer: 49 mL/min/1.73 — ABNORMAL LOW
Globulin, Total: 3 g/dL (ref 1.5–4.5)
Glucose: 124 mg/dL — ABNORMAL HIGH (ref 65–99)
Potassium: 4.8 mmol/L (ref 3.5–5.2)
Sodium: 142 mmol/L (ref 134–144)
Total Protein: 7.2 g/dL (ref 6.0–8.5)

## 2015-04-20 LAB — LIPID PANEL
CHOL/HDL RATIO: 9.1 ratio — AB (ref 0.0–4.4)
Cholesterol, Total: 365 mg/dL — ABNORMAL HIGH (ref 100–199)
HDL: 40 mg/dL (ref >39)
LDL Calculated: 278 mg/dL — ABNORMAL HIGH (ref 0–99)
Triglycerides: 237 mg/dL — ABNORMAL HIGH (ref 0–149)
VLDL CHOLESTEROL CAL: 47 mg/dL — AB (ref 5–40)

## 2015-04-20 LAB — TSH: TSH: 2.55 u[IU]/mL (ref 0.450–4.500)

## 2015-05-01 ENCOUNTER — Encounter: Payer: Self-pay | Admitting: *Deleted

## 2015-05-27 ENCOUNTER — Ambulatory Visit: Payer: BC Managed Care – PPO | Admitting: Nurse Practitioner

## 2015-08-12 ENCOUNTER — Encounter: Payer: Self-pay | Admitting: Family Medicine

## 2015-08-12 ENCOUNTER — Encounter (INDEPENDENT_AMBULATORY_CARE_PROVIDER_SITE_OTHER): Payer: Self-pay

## 2015-08-12 ENCOUNTER — Ambulatory Visit (INDEPENDENT_AMBULATORY_CARE_PROVIDER_SITE_OTHER): Payer: BLUE CROSS/BLUE SHIELD | Admitting: Family Medicine

## 2015-08-12 VITALS — BP 108/72 | HR 58 | Temp 97.0°F | Ht 64.0 in | Wt 208.8 lb

## 2015-08-12 DIAGNOSIS — S76011A Strain of muscle, fascia and tendon of right hip, initial encounter: Secondary | ICD-10-CM

## 2015-08-12 DIAGNOSIS — S76019A Strain of muscle, fascia and tendon of unspecified hip, initial encounter: Secondary | ICD-10-CM | POA: Insufficient documentation

## 2015-08-12 NOTE — Progress Notes (Signed)
   HPI  Patient presents today here for right groin pain.  Patient explains that 3 weeks ago after she started doing yoga. She describes her yoga exercise as yoga from a chair and not very intense. She's been more active since her husband had a heart attack. She describes right groin pain with walking and severe right groin pain with trying to move her right leg during sleep. She's been using ibuprofen sparingly with some improvement. She has sharp right groin pain radiating to her right low back. She has no erythema, bulge, or warmth at the area.  PMH: Smoking status noted ROS: Per HPI  Objective: BP 108/72 mmHg  Pulse 58  Temp(Src) 97 F (36.1 C) (Oral)  Ht 5\' 4"  (1.626 m)  Wt 208 lb 12.8 oz (94.711 kg)  BMI 35.82 kg/m2 Gen: NAD, alert, cooperative with exam HEENT: NCAT Ext: No edema, warm Neuro: Alert and oriented, No gross deficits Msk: Tenderness to palpation over the right sided abductor insertion on the pelvis, my nurse was present for this exam. She also has tenderness with abduction of the hip, straight leg raise Normal gait  Assessment and plan:  # Groin strain, right-sided hip abductor strain Discussed stretching and cervical therapy Encouraged her to keep moving to avoid exercises that hurt her right groin for a few weeks. Tylenol for pain given previous reduced GFR.    Laroy Apple, MD [facility] 08/12/2015, 10:52 AM

## 2015-08-12 NOTE — Patient Instructions (Signed)
Great to see you!  Look through the handout I gave you and do try the stretches as soon as you can tolerate them, respect the pain and don't push too hard if you are hurting.

## 2015-09-05 IMAGING — DX DG CHEST 2V
2 series · 2 of 2 positions shown · non-contrast
Comparison: 12/26/2013.

CLINICAL DATA: History of renal cell carcinoma. Hypertension.
Annual evaluation.

EXAM:
CHEST  2 VIEW

[chest pa]
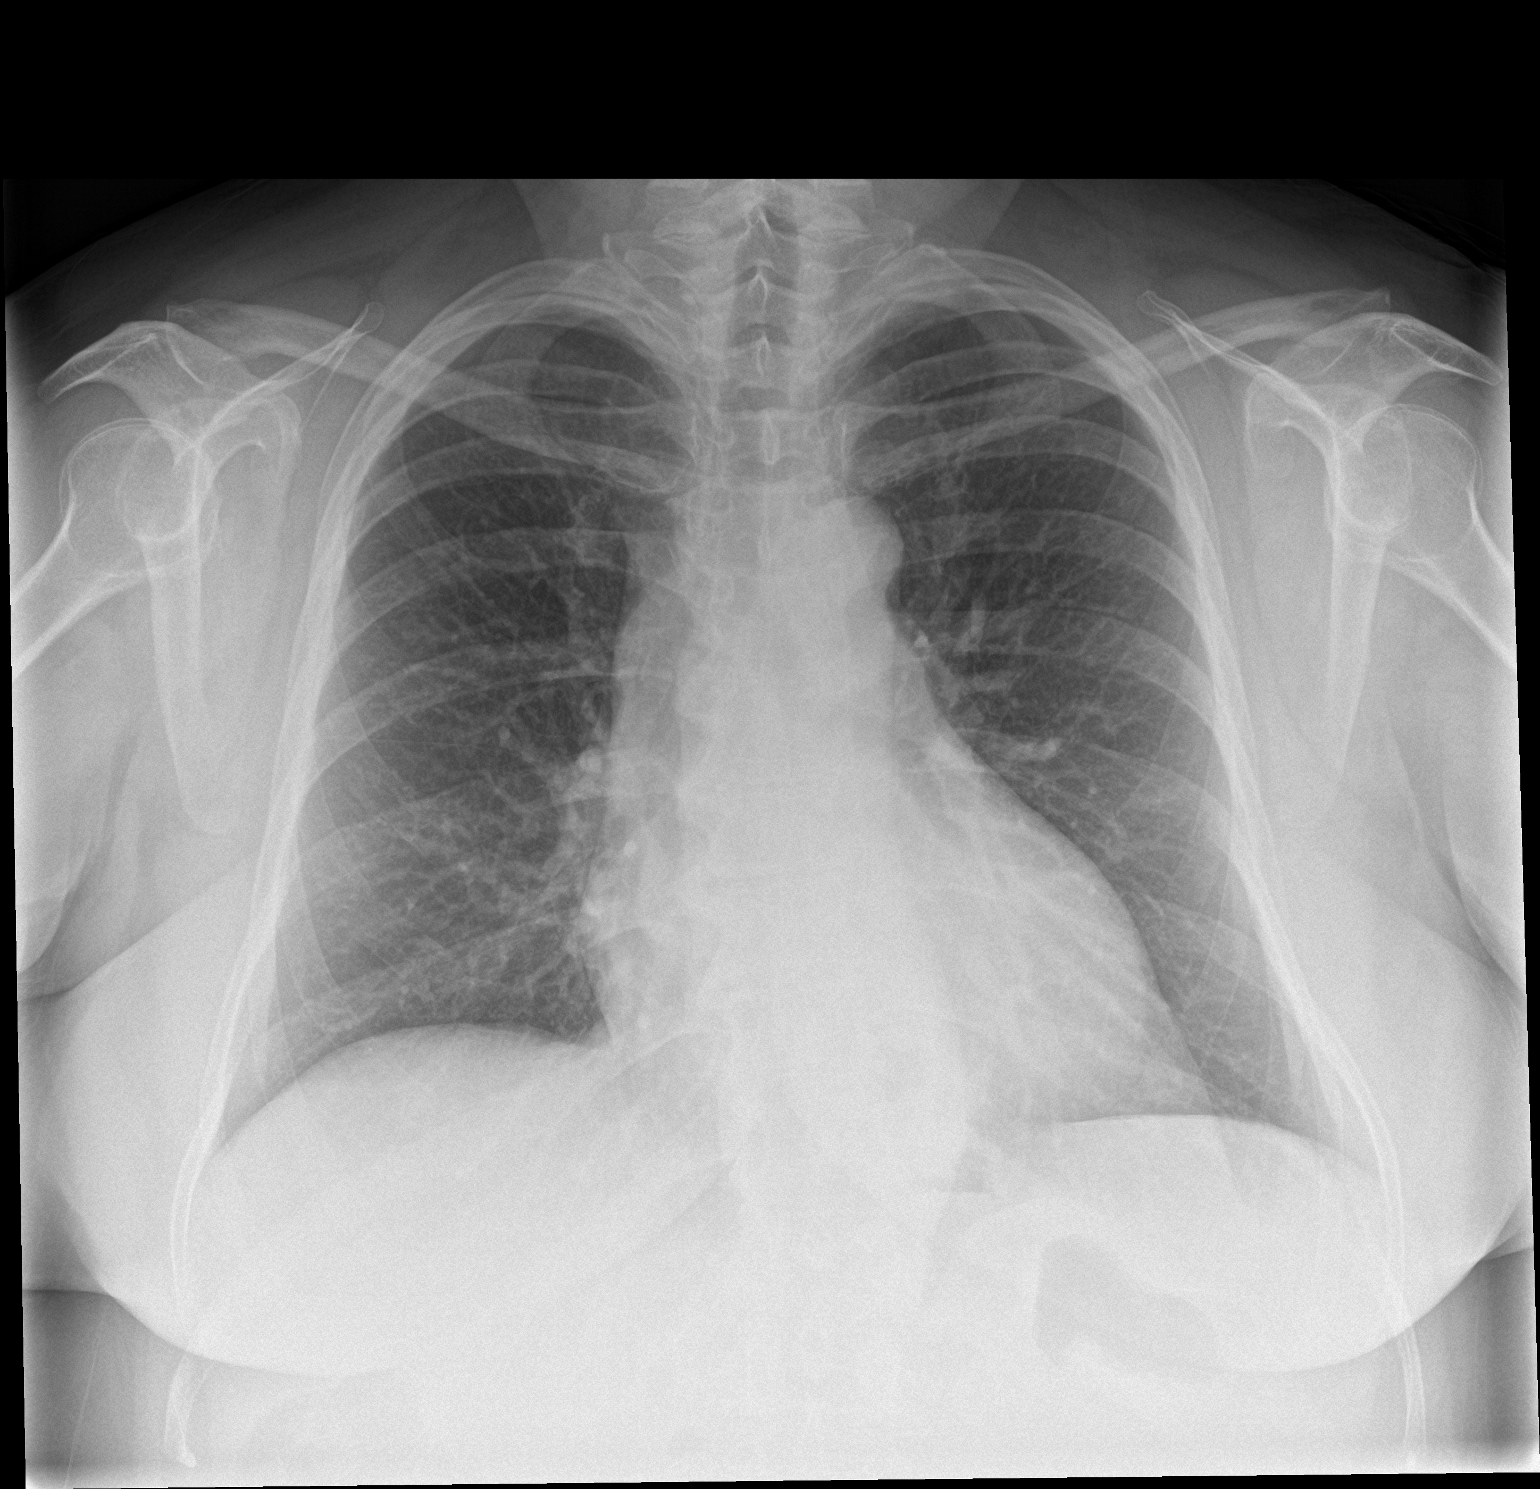

[chest lat]
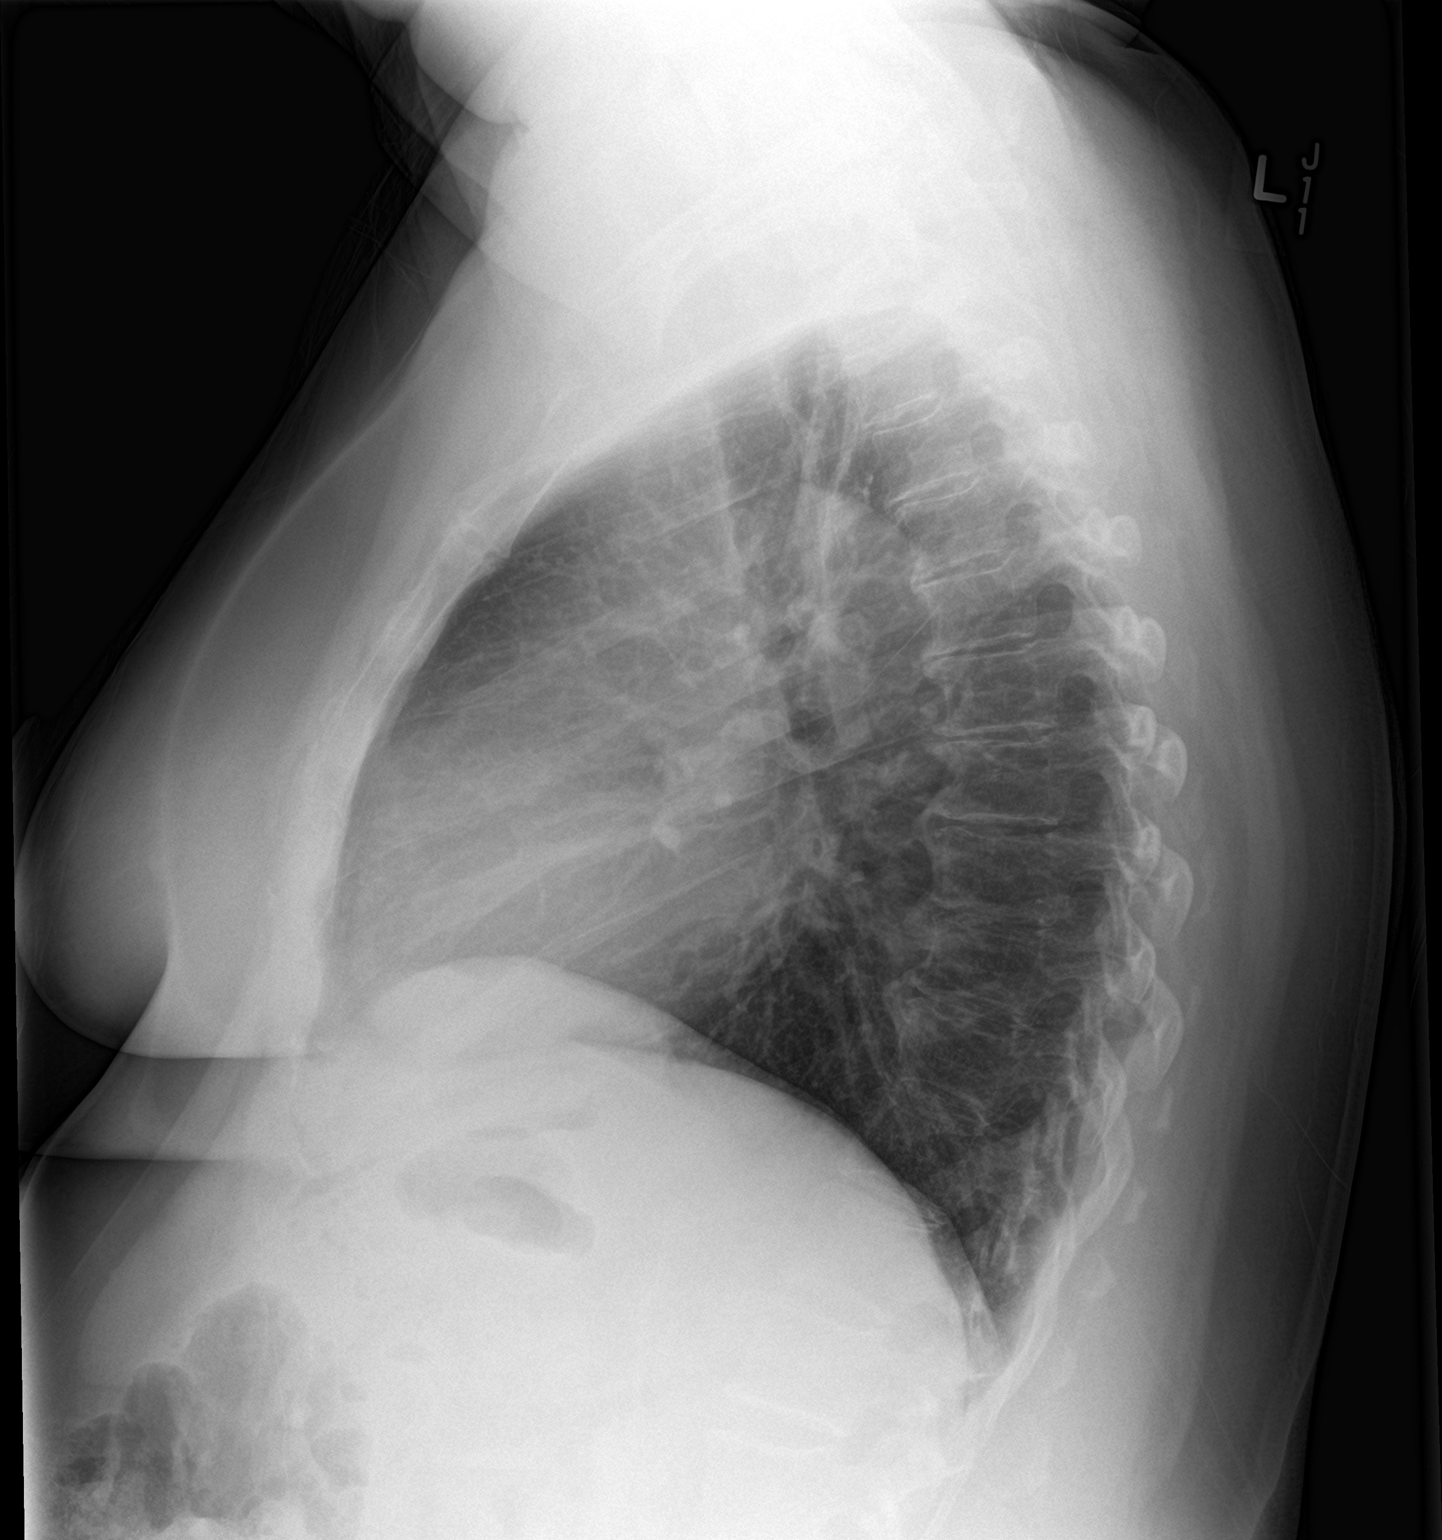

[2 of 2 positions shown; findings below may reference images not displayed]

FINDINGS: Mediastinum and hilar structures normal. Cardiomegaly with normal
pulmonary vascularity. No focal infiltrate. No pleural effusion or
pneumothorax. Diffuse thoracic spine osteopenia degenerative change.
No acute bony abnormality.
IMPRESSION: 1. Stable cardiomegaly.  No CHF.
2. No acute abnormality identified. No focal pulmonary infiltrate or
pulmonary lesions identified.

## 2015-10-21 ENCOUNTER — Ambulatory Visit (INDEPENDENT_AMBULATORY_CARE_PROVIDER_SITE_OTHER): Payer: BLUE CROSS/BLUE SHIELD | Admitting: Family Medicine

## 2015-10-21 ENCOUNTER — Encounter: Payer: Self-pay | Admitting: Family Medicine

## 2015-10-21 VITALS — BP 115/65 | HR 84 | Temp 97.2°F | Ht 64.0 in | Wt 206.8 lb

## 2015-10-21 DIAGNOSIS — M791 Myalgia: Secondary | ICD-10-CM | POA: Diagnosis not present

## 2015-10-21 DIAGNOSIS — I10 Essential (primary) hypertension: Secondary | ICD-10-CM | POA: Diagnosis not present

## 2015-10-21 DIAGNOSIS — G8929 Other chronic pain: Secondary | ICD-10-CM | POA: Insufficient documentation

## 2015-10-21 DIAGNOSIS — M7918 Myalgia, other site: Secondary | ICD-10-CM

## 2015-10-21 DIAGNOSIS — B0229 Other postherpetic nervous system involvement: Secondary | ICD-10-CM | POA: Diagnosis not present

## 2015-10-21 DIAGNOSIS — E785 Hyperlipidemia, unspecified: Secondary | ICD-10-CM | POA: Diagnosis not present

## 2015-10-21 MED ORDER — PREGABALIN 75 MG PO CAPS: 75.0000 mg | ORAL_CAPSULE | Freq: Two times a day (BID) | ORAL | Status: DC

## 2015-10-21 NOTE — Patient Instructions (Signed)
Great to see you!  Try lyrica, Start with 1 pill at night for 2-3 nights then 1 pill twice daily  Come back in 1 month  Try to walk 20 minutes 4 times a week Consider zetia

## 2015-10-21 NOTE — Progress Notes (Signed)
   HPI  Patient presents today for six-month follow-up and to discuss pain.  Pain Patient explains that she has chronic musculoskeletal pain. She complains of bilateral leg pain, shoulder pain, arm pain, and low back pain. She is concerned that she has fibromyalgia. She's hesitant to take any pain medications. She does not have much improvement with Tylenol. She has several tender spots which are tender to palpation- for example on her bilateral shoulders.  Hypertension No chest pain, dyspnea, palpitations, leg edema. Not checking blood pressure. Good medication compliance.  Hyperlipidemia His tried several statins with myalgias She's also been on severe previously stopped this due to ineffectiveness. She has no history of CVA or MI, or PAD.  She denies any depression, anxiety, or suicidal thoughts.    PMH: Smoking status noted ROS: Per HPI  Objective: BP 115/65 mmHg  Pulse 84  Temp(Src) 97.2 F (36.2 C) (Oral)  Ht 5\' 4"  (1.626 m)  Wt 206 lb 12.8 oz (93.804 kg)  BMI 35.48 kg/m2 Gen: NAD, alert, cooperative with exam HEENT: NCAT CV: RRR, good S1/S2, no murmur Resp: CTABL, no wheezes, non-labored Ext: No edema, warm Neuro: Alert and oriented, No gross deficits  Assessment and plan:  # Chronic musculoskeletal pain, likely fibromyalgia Treat with Lyrica. Also has postherpetic neuralgia which I think she will benefit from Lyrica with. Considered Cymbalta initially, however her insurance prefers Lyrica. Follow-up one month.  # Hypertension Well-controlled No change in medications. Labs at Visit in one month  # Hyperlipidemia Severe range like hyperlipidemia, statin intolerant, no ASCVD to indicate need for PCSK-9 orders. Trial of Zetia, discussed with her, she will consider   Meds ordered this encounter  Medications  . famotidine (PEPCID) 20 MG tablet    Sig: Take 20 mg by mouth 2 (two) times daily.  . pregabalin (LYRICA) 75 MG capsule    Sig: Take 1  capsule (75 mg total) by mouth 2 (two) times daily.    Dispense:  60 capsule    Refill:  [place], MD [facility] 10/21/2015, 1:07 PM

## 2015-11-01 ENCOUNTER — Telehealth: Payer: Self-pay | Admitting: Family Medicine

## 2015-11-01 ENCOUNTER — Telehealth: Payer: Self-pay

## 2015-11-01 MED ORDER — DULOXETINE HCL 30 MG PO CPEP: ORAL_CAPSULE | ORAL | Status: DC

## 2015-11-01 NOTE — Addendum Note (Signed)
Addended by: Timmothy Euler on: 11/01/2015 02:02 PM   Modules accepted: Orders, Medications

## 2015-11-01 NOTE — Telephone Encounter (Signed)
Pt is going to start the Cymbalta and understands the side effects. Just fyi

## 2015-11-01 NOTE — Telephone Encounter (Signed)
Cymbalta with the other medication that we discussed in the visit.  She can discontinue Lyrica, trial of Cymbalta There is an antidepressant that has been shown to help with chronic pain,we start one half dose to help tolerability. The most common side effect is GI upset, but is unlikely and usually very well tolerated. However research has shown some increase in suicidal thoughts that these occur the patient should discontinue immediately and seek medical help.  Laroy Apple, MD [facility] 11/01/2015, 1:53 PM

## 2015-11-01 NOTE — Telephone Encounter (Signed)
Returned patient's phone call. Patient states that she has had more swelling since starting Lyrica.  Her face and both legs are now swelling.  Patient would like to stop Lyrica and try something different.  [facility] in [place]

## 2015-11-01 NOTE — Telephone Encounter (Signed)
Spoke with pt and she is aware. Pt states that you had told her previous at her apt that her insurance would not pay for the Cymbalta that's why yall went with the lyrica. Please advise.

## 2015-11-19 ENCOUNTER — Ambulatory Visit: Payer: BLUE CROSS/BLUE SHIELD | Admitting: Family Medicine

## 2015-12-03 ENCOUNTER — Ambulatory Visit (INDEPENDENT_AMBULATORY_CARE_PROVIDER_SITE_OTHER): Payer: BLUE CROSS/BLUE SHIELD | Admitting: Family Medicine

## 2015-12-03 ENCOUNTER — Encounter: Payer: Self-pay | Admitting: Family Medicine

## 2015-12-03 VITALS — BP 121/67 | HR 73 | Temp 97.1°F | Ht 64.0 in | Wt 202.8 lb

## 2015-12-03 DIAGNOSIS — E785 Hyperlipidemia, unspecified: Secondary | ICD-10-CM | POA: Diagnosis not present

## 2015-12-03 DIAGNOSIS — M7918 Myalgia, other site: Principal | ICD-10-CM

## 2015-12-03 DIAGNOSIS — M791 Myalgia: Secondary | ICD-10-CM | POA: Diagnosis not present

## 2015-12-03 DIAGNOSIS — G8929 Other chronic pain: Secondary | ICD-10-CM

## 2015-12-03 MED ORDER — DULOXETINE HCL 60 MG PO CPEP: 60.0000 mg | ORAL_CAPSULE | Freq: Every day | ORAL | Status: DC

## 2015-12-03 NOTE — Patient Instructions (Signed)
Great to see you!  Lets see you back in 3 months and we can discuss cholesterol  I am glad the medicine is helping

## 2015-12-03 NOTE — Progress Notes (Signed)
   HPI  Patient presents today for chronic pain follow-up.  Patient states that she feels much better on the Cymbalta. She denies any anxiety, depression, or psychotic thoughts. She has slight GI upset but states that benefited medicine is much more than GI upset. She had a bad reaction to Lyrica with flushing  Hyperlipidemia Patient has tried several statins including livalo, crestor, and lipitor She has tried welchol and zetia Had bad reactions or intolerance to all of them No Hx of CAD  PMH: Smoking status noted ROS: Per HPI  Objective: BP 121/67 mmHg  Pulse 73  Temp(Src) 97.1 F (36.2 C) (Oral)  Ht 5\' 4"  (1.626 m)  Wt 202 lb 12.8 oz (91.989 kg)  BMI 34.79 kg/m2 Gen: NAD, alert, cooperative with exam HEENT: NCAT CV: RRR, good S1/S2, no murmur Resp: CTABL, no wheezes, non-labored Ext: No edema, warm Neuro: Alert and oriented, No gross deficits  Assessment and plan:  # Musculoskeletal pain, likely fibromyalgia Responding well to Cymbalta, she's tolerating 60 mg a day Refilled Follow-up in 3-4 months to discuss cholesterol  # Hyperlipidemia Very elevated, not tolerating any hyperlipidemia medications Will discuss with clinical pharmacist to see if she could qualify for PCSK-9 inhibitors, repatha    Meds ordered this encounter  Medications  . DULoxetine (CYMBALTA) 60 MG capsule    Sig: Take 1 capsule (60 mg total) by mouth daily.    Dispense:  90 capsule    Refill:  [place], MD [facility] 12/03/2015, 9:11 AM

## 2015-12-05 ENCOUNTER — Telehealth: Payer: Self-pay | Admitting: *Deleted

## 2015-12-05 NOTE — Telephone Encounter (Signed)
-----   Message from Timmothy Euler, MD sent at 12/05/2015 10:47 AM EDT ----- Nursing pool - Can we call her and ask her to make an appointment with Tammy to discuss ideas for her cholesterol treatment?  Thanks! Sam  Thanks for the help Tammy! ----- Message -----    From: Cherre Robins, PHARMD    Sent: 12/03/2015   9:28 AM      To: Timmothy Euler, MD  Her LDL is high enough to do an evalution with the [place] Lipid Criteria to see if she has high likelihood of familial hypercholesterolemia.  You can find criteria on line or refer her and I can discuss PSK9 options and see if she qualifies.  There is a pretty involved PA process.   ----- Message -----    From: Timmothy Euler, MD    Sent: 12/03/2015   9:16 AM      To: Cherre Robins, PHARMD  This pt is statin intolerant, including livalo, and has not had any ASCVD she knows about, any chance she could qualify for repatha? She is transitioning to medicare in 1-2 months Thanks! Sam,

## 2015-12-05 NOTE — Telephone Encounter (Signed)
Pt aware of recommendations, not at a place at the moment to be able to schedule appt, will call back to do so

## 2016-01-06 ENCOUNTER — Telehealth: Payer: Self-pay | Admitting: Family Medicine

## 2016-01-07 ENCOUNTER — Telehealth: Payer: Self-pay | Admitting: Family Medicine

## 2016-01-07 NOTE — Telephone Encounter (Signed)
Patient feels that after Cymbalta has settled in she has decreased far vision with blurred vision far sight  She thinks that it might be that Cymbalta is helping her, however if she has decreased vision because of symptoms Cymbalta she would like to stop. She states that she would rather her not be able to see.  I  understand, there is 1-3% chance of blurred vision with Cymbalta.  Call with any concerns  Laroy Apple, MD [facility] 01/07/2016, 5:35 PM

## 2016-01-07 NOTE — Telephone Encounter (Signed)
Call to discuss, no answer. Left voicemail that I will try again or she can call me.  Laroy Apple, MD [facility] 01/07/2016, 12:27 PM

## 2016-01-27 DIAGNOSIS — M7541 Impingement syndrome of right shoulder: Secondary | ICD-10-CM | POA: Diagnosis not present

## 2016-01-27 DIAGNOSIS — M5432 Sciatica, left side: Secondary | ICD-10-CM | POA: Diagnosis not present

## 2016-01-27 DIAGNOSIS — M542 Cervicalgia: Secondary | ICD-10-CM | POA: Diagnosis not present

## 2016-01-27 DIAGNOSIS — M25562 Pain in left knee: Secondary | ICD-10-CM | POA: Diagnosis not present

## 2016-02-06 ENCOUNTER — Other Ambulatory Visit: Payer: Self-pay | Admitting: Family Medicine

## 2016-03-05 ENCOUNTER — Encounter: Payer: Self-pay | Admitting: Family Medicine

## 2016-03-05 ENCOUNTER — Ambulatory Visit (INDEPENDENT_AMBULATORY_CARE_PROVIDER_SITE_OTHER): Payer: Medicare Other | Admitting: Family Medicine

## 2016-03-05 VITALS — BP 100/62 | HR 82 | Temp 97.8°F | Ht 64.0 in | Wt 203.4 lb

## 2016-03-05 DIAGNOSIS — E2839 Other primary ovarian failure: Secondary | ICD-10-CM

## 2016-03-05 DIAGNOSIS — G8929 Other chronic pain: Secondary | ICD-10-CM | POA: Diagnosis not present

## 2016-03-05 DIAGNOSIS — H538 Other visual disturbances: Secondary | ICD-10-CM | POA: Diagnosis not present

## 2016-03-05 DIAGNOSIS — M791 Myalgia: Secondary | ICD-10-CM | POA: Diagnosis not present

## 2016-03-05 DIAGNOSIS — M7918 Myalgia, other site: Secondary | ICD-10-CM

## 2016-03-05 DIAGNOSIS — E785 Hyperlipidemia, unspecified: Secondary | ICD-10-CM

## 2016-03-05 LAB — BAYER DCA HB A1C WAIVED: HB A1C: 6.1 % (ref <7.0)

## 2016-03-05 MED ORDER — DULOXETINE HCL 30 MG PO CPEP: 30.0000 mg | ORAL_CAPSULE | Freq: Every day | ORAL | Status: DC

## 2016-03-05 MED ORDER — DULOXETINE HCL 60 MG PO CPEP: 60.0000 mg | ORAL_CAPSULE | Freq: Every day | ORAL | Status: DC

## 2016-03-05 NOTE — Progress Notes (Signed)
Patient aware.

## 2016-03-05 NOTE — Patient Instructions (Signed)
Great to see you!  We will call with the A1C within 1 week  Try Red rice yeast once daily  Come back in September fasting  Start cymbalta 30 mg daily for 2 weeks then 60 mg daily.

## 2016-03-05 NOTE — Progress Notes (Signed)
   HPI  Patient presents today here for follow-up.  Patient has history of chronic musculoskeletal joint pain in several joints including knees, low back. She previously had very good improvement with Cymbalta, however she developed blurred vision and was suspicious of Cymbalta causing this. She's now been off of this for about 2 months with continued blurred vision. She states that she would like to restart Cymbalta because a very good effect. She denies any depression or suicidal thoughts.  Blurred vision Intermittent blurred vision, she's a retired Therapist, sports, she's concerned about diabetes  Hyperlipidemia She has a long history of hyperlipidemia, no history of heart attack or stroke She's tried several statins including simvastatin, pravastatin, Crestor, Lipitor She's also tried [employer] and Zetia, she has not tolerated any of these. She's not really watching her diet   PMH: Smoking status noted ROS: Per HPI  Objective: BP 100/62 mmHg  Pulse 82  Temp(Src) 97.8 F (36.6 C) (Oral)  Ht 5\' 4"  (1.626 m)  Wt 203 lb 6.4 oz (92.262 kg)  BMI 34.90 kg/m2 Gen: NAD, alert, cooperative with exam HEENT: NCAT CV: RRR, good S1/S2, no murmur Resp: CTABL, no wheezes, non-labored Ext: No edema, warm Neuro: Alert and oriented, No gross deficits  Assessment and plan:  # Chronic musculoskeletal pain Restart Cymbalta, 30 mg 2 weeks, then increase to 60 mg Tolerated well previously except for concern about blurred vision  # Blurred vision I agree she should be evaluated for diabetes, A1c pending  # Hyperlipidemia Very elevated She's tried several medications which she did not tolerate. Recommended red rice yeast She is not candidate forPCSK-9 inhibitors b/c she has no Hx of ASCVD Repeat fasting in 3 months    Orders Placed This Encounter  Procedures  . Bayer DCA Hb A1c Waived    Meds ordered this encounter  Medications  . DISCONTD: DULoxetine (CYMBALTA) 30 MG capsule    Sig: Take 1  capsule (30 mg total) by mouth daily.    Dispense:  14 capsule    Refill:  0  . DULoxetine (CYMBALTA) 60 MG capsule    Sig: Take 1 capsule (60 mg total) by mouth daily.    Dispense:  90 capsule    Refill:  3    Please fill after 2 week induction  With 30 mg, pt has some 60 mg pills left so will fill later    Laroy Apple, MD [facility] 03/05/2016, 8:45 AM

## 2016-04-22 ENCOUNTER — Ambulatory Visit (INDEPENDENT_AMBULATORY_CARE_PROVIDER_SITE_OTHER): Payer: Medicare Other

## 2016-04-22 DIAGNOSIS — E2839 Other primary ovarian failure: Secondary | ICD-10-CM

## 2016-04-22 DIAGNOSIS — Z780 Asymptomatic menopausal state: Secondary | ICD-10-CM | POA: Diagnosis not present

## 2016-04-28 ENCOUNTER — Ambulatory Visit (INDEPENDENT_AMBULATORY_CARE_PROVIDER_SITE_OTHER): Payer: Medicare Other | Admitting: Family Medicine

## 2016-04-28 ENCOUNTER — Encounter: Payer: Self-pay | Admitting: Family Medicine

## 2016-04-28 ENCOUNTER — Encounter: Payer: Self-pay | Admitting: Gastroenterology

## 2016-04-28 VITALS — BP 109/62 | HR 56 | Temp 97.5°F | Ht 64.0 in | Wt 200.4 lb

## 2016-04-28 DIAGNOSIS — R197 Diarrhea, unspecified: Secondary | ICD-10-CM

## 2016-04-28 DIAGNOSIS — R1011 Right upper quadrant pain: Secondary | ICD-10-CM

## 2016-04-28 DIAGNOSIS — G8929 Other chronic pain: Secondary | ICD-10-CM | POA: Diagnosis not present

## 2016-04-28 DIAGNOSIS — M791 Myalgia: Secondary | ICD-10-CM | POA: Diagnosis not present

## 2016-04-28 DIAGNOSIS — Z0000 Encounter for general adult medical examination without abnormal findings: Secondary | ICD-10-CM | POA: Insufficient documentation

## 2016-04-28 DIAGNOSIS — M7918 Myalgia, other site: Secondary | ICD-10-CM

## 2016-04-28 NOTE — Progress Notes (Signed)
   HPI  Patient presents today here for right upper quadrant abdominal pain and diarrhea.  Patient has had very mild pain in predominant diarrhea.  The diarrhea is been persistent 7-8 episodes daily for one month. Over the last week or so her stools have changed to small caliber and soft, these of reduced in number down to about 5-6 episodes per day. She has mild association air in frequently with eating. She has normal food and fluid tolerance. She denies any blood in her stool.  She's been more than 10 years since her last colonoscopy.  She started Cymbalta about 6 weeks ago, she previously tolerated this very well.  She has good improvement in pain with Cymbalta. She has no nausea, fever, chills, or recent antibiotic use PMH: Smoking status noted ROS: Per HPI  Objective: BP 109/62   Pulse (!) 56   Temp 97.5 F (36.4 C) (Oral)   Ht '5\' 4"'$  (1.626 m)   Wt 200 lb 6 oz (90.9 kg)   BMI 34.39 kg/m  Gen: NAD, alert, cooperative with exam HEENT: NCAT CV: RRR, good S1/S2, no murmur Resp: CTABL, no wheezes, non-labored Abd: Soft, right upper quadrant tenderness to palpation as well as less severe right lower quadrant tenderness to palpation, positive bowel sounds, no rebound, positive Murphy sign Ext: No edema, warm Neuro: Alert and oriented, No gross deficits  Assessment and plan:  # Diarrhea, change in stool caliber 4-6 week duration, now changing to smaller but very soft stools rather than pure diarrhea. Abdominal exam significant for right upper quadrant tenderness Ultrasound, labs Refer to GI, she is due for colonoscopy anyway. No clear signs of cholelithiasis  # Healthcare maintenance Referring to GI, reviewed recent DEXA scan which was normal.  Chronic musculoskeletal pain Doing better with Cymbalta, she's been on Cymbalta now about 6 weeks, this is similar temporal relation to her diarrhea. Previously she was treated with Cymbalta for a few months and tolerated  easily without any GI symptoms. We discussed cutting back on dose versus stopping the medicine to see if she has improvement, however she gets enough benefit that she would like to continue.    Orders Placed This Encounter  Procedures  . US Abdomen Complete    Standing Status:   Future    Standing Expiration Date:   06/28/2017    Order Specific Question:   Reason for Exam (SYMPTOM  OR DIAGNOSIS REQUIRED)    Answer:   RUQ pain, + murphy sign    Order Specific Question:   Preferred imaging location?    Answer:   [facility]  . Lipase  . CMP14+EGFR  . CBC with Differential    No orders of the defined types were placed in this encounter.   Laroy Apple, MD [facility] 04/28/2016, 9:15 AM

## 2016-04-28 NOTE — Patient Instructions (Signed)
Great to see you!  We will call with labs within 1 week  We will work on a referral and an ultrasound and let you know when and how they are scheduled.

## 2016-04-29 ENCOUNTER — Ambulatory Visit (HOSPITAL_COMMUNITY)
Admission: RE | Admit: 2016-04-29 | Discharge: 2016-04-29 | Disposition: A | Discharge status: Home / Self Care | Payer: Medicare Other | Source: Ambulatory Visit | Attending: Family Medicine | Admitting: Family Medicine

## 2016-04-29 ENCOUNTER — Telehealth: Payer: Self-pay | Admitting: Family Medicine

## 2016-04-29 DIAGNOSIS — K760 Fatty (change of) liver, not elsewhere classified: Secondary | ICD-10-CM | POA: Insufficient documentation

## 2016-04-29 DIAGNOSIS — Z905 Acquired absence of kidney: Secondary | ICD-10-CM | POA: Diagnosis not present

## 2016-04-29 DIAGNOSIS — K838 Other specified diseases of biliary tract: Secondary | ICD-10-CM | POA: Insufficient documentation

## 2016-04-29 DIAGNOSIS — R1011 Right upper quadrant pain: Secondary | ICD-10-CM | POA: Insufficient documentation

## 2016-04-29 LAB — LIPASE: LIPASE: 38 U/L (ref 0–59)

## 2016-04-29 LAB — CBC WITH DIFFERENTIAL/PLATELET
BASOS ABS: 0 10*3/uL (ref 0.0–0.2)
Basos: 1 %
EOS (ABSOLUTE): 0.2 10*3/uL (ref 0.0–0.4)
Eos: 3 %
Hematocrit: 41.9 % (ref 34.0–46.6)
Hemoglobin: 13.4 g/dL (ref 11.1–15.9)
IMMATURE GRANS (ABS): 0 10*3/uL (ref 0.0–0.1)
IMMATURE GRANULOCYTES: 0 %
LYMPHS: 39 %
Lymphocytes Absolute: 2.5 10*3/uL (ref 0.7–3.1)
MCH: 30.6 pg (ref 26.6–33.0)
MCHC: 32 g/dL (ref 31.5–35.7)
MCV: 96 fL (ref 79–97)
Monocytes Absolute: 0.5 10*3/uL (ref 0.1–0.9)
Monocytes: 7 %
NEUTROS PCT: 50 %
Neutrophils Absolute: 3.2 10*3/uL (ref 1.4–7.0)
PLATELETS: 219 10*3/uL (ref 150–379)
RBC: 4.38 x10E6/uL (ref 3.77–5.28)
RDW: 14 % (ref 12.3–15.4)
WBC: 6.3 10*3/uL (ref 3.4–10.8)

## 2016-04-29 LAB — CMP14+EGFR
A/G RATIO: 1.6 (ref 1.2–2.2)
ALT: 36 IU/L — AB (ref 0–32)
AST: 27 IU/L (ref 0–40)
Albumin: 4.2 g/dL (ref 3.6–4.8)
Alkaline Phosphatase: 75 IU/L (ref 39–117)
BILIRUBIN TOTAL: 0.4 mg/dL (ref 0.0–1.2)
BUN/Creatinine Ratio: 9 — ABNORMAL LOW (ref 12–28)
BUN: 9 mg/dL (ref 8–27)
CHLORIDE: 100 mmol/L (ref 96–106)
CO2: 25 mmol/L (ref 18–29)
Calcium: 9.1 mg/dL (ref 8.7–10.3)
Creatinine, Ser: 0.99 mg/dL (ref 0.57–1.00)
GFR calc Af Amer: 69 mL/min/{1.73_m2} (ref >59)
GFR calc non Af Amer: 60 mL/min/{1.73_m2} (ref >59)
GLUCOSE: 111 mg/dL — AB (ref 65–99)
Globulin, Total: 2.7 g/dL (ref 1.5–4.5)
POTASSIUM: 4.1 mmol/L (ref 3.5–5.2)
Sodium: 141 mmol/L (ref 134–144)
Total Protein: 6.9 g/dL (ref 6.0–8.5)

## 2016-04-29 NOTE — Telephone Encounter (Signed)
I have called and discussed her labs and ultrasound findings.  She does not have overt abdominal pain after eating. She does have pain with palpation. We will investigate further using [place], MD [facility] 04/29/2016, 5:25 PM

## 2016-04-29 NOTE — Telephone Encounter (Signed)
Called and left VM.   Labs are ok, need to discuss [place] with biliary sludge. Consider HIDA scan.   Will attempt again.   Laroy Apple, MD [facility] 04/29/2016, 1:21 PM

## 2016-05-04 ENCOUNTER — Encounter: Payer: Self-pay | Admitting: Family Medicine

## 2016-05-06 ENCOUNTER — Encounter (HOSPITAL_COMMUNITY): Payer: Self-pay

## 2016-05-06 ENCOUNTER — Encounter (HOSPITAL_COMMUNITY)
Admission: RE | Admit: 2016-05-06 | Discharge: 2016-05-06 | Disposition: A | Discharge status: Home / Self Care | Payer: Medicare Other | Source: Ambulatory Visit | Attending: Family Medicine | Admitting: Family Medicine

## 2016-05-06 DIAGNOSIS — R109 Unspecified abdominal pain: Secondary | ICD-10-CM | POA: Diagnosis not present

## 2016-05-06 DIAGNOSIS — K838 Other specified diseases of biliary tract: Secondary | ICD-10-CM | POA: Diagnosis not present

## 2016-05-06 MED ORDER — TECHNETIUM TC 99M MEBROFENIN IV KIT
5.0000 | PACK | Freq: Once | INTRAVENOUS | Status: AC | PRN
  Administered 2016-05-06: 5 via INTRAVENOUS

## 2016-05-14 ENCOUNTER — Other Ambulatory Visit: Payer: Self-pay | Admitting: Family Medicine

## 2016-06-03 ENCOUNTER — Encounter: Payer: Self-pay | Admitting: Gastroenterology

## 2016-06-03 ENCOUNTER — Ambulatory Visit (INDEPENDENT_AMBULATORY_CARE_PROVIDER_SITE_OTHER): Payer: Medicare Other | Admitting: Gastroenterology

## 2016-06-03 VITALS — BP 120/72 | HR 68 | Ht 64.0 in | Wt 201.2 lb

## 2016-06-03 DIAGNOSIS — R109 Unspecified abdominal pain: Secondary | ICD-10-CM

## 2016-06-03 DIAGNOSIS — G8929 Other chronic pain: Secondary | ICD-10-CM | POA: Diagnosis not present

## 2016-06-03 DIAGNOSIS — R1314 Dysphagia, pharyngoesophageal phase: Secondary | ICD-10-CM | POA: Diagnosis not present

## 2016-06-03 DIAGNOSIS — R1011 Right upper quadrant pain: Secondary | ICD-10-CM | POA: Diagnosis not present

## 2016-06-03 DIAGNOSIS — R197 Diarrhea, unspecified: Secondary | ICD-10-CM | POA: Diagnosis not present

## 2016-06-03 MED ORDER — NA SULFATE-K SULFATE-MG SULF 17.5-3.13-1.6 GM/177ML PO SOLN: 1.0000 | Freq: Once | ORAL | 0 refills | Status: AC

## 2016-06-03 NOTE — Progress Notes (Addendum)
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 She is accompanied by her husband. She relates the onset of diarrhea in July with 6-7 loose, watery, urgent bowel movements per day. Over the past 2 months her diarrhea has gradually improved to 2-3 semi-formed bowel movements per day without any specific therapy. She has chronic right flank pain and underwent an imaging evaluation as listed below. Blood work in August revealed a normal CBC and lipase. CMP was unremarkable except for minimally elevated ALT at 36 and minimally elevated glucose at 111. She previously underwent colonoscopy by Dr. Rowe Pavy at [facility] about 10 years ago and does not recall any findings. She relates a prior endoscopy by Dr. Rowe Pavy about 10 years ago with dilation of an esopahgeal stricture. She states she has had a few episodes of solid food dysphagia over the past 2 months. Facial episodes of heartburn taking pantoprazole daily when necessary. She notes about a 5 pound weight loss. Denies abdominal pain, constipation, change in stool caliber, melena, hematochezia, nausea, vomiting, chest pain.   Abd US IMPRESSION: Status post right nephrectomy. Fatty liver. Gallbladder sludge. No acute abnormality noted.  HIDA with EF IMPRESSION: Normal exam. Calculated gallbladder ejection fraction is 59%. (Normal gallbladder ejection fraction with Ensure is greater than 33%.)   Review of Systems: Pertinent positive and negative review of systems were noted in the above HPI section. All other review of systems were otherwise negative.  Current Medications, Allergies, Past Medical History, Past Surgical History, Family History and Social History were reviewed in [employer] record.  Physical Exam: General: Well developed, well nourished, no acute distress Head: Normocephalic and  atraumatic Eyes:  sclerae anicteric, EOMI Ears: Normal auditory acuity Mouth: No deformity or lesions Neck: Supple, no masses or thyromegaly Lungs: Clear throughout to auscultation Chest: Markedly tenderness over lower lateral right ribs Heart: Regular rate and rhythm; no murmurs, rubs or bruits Abdomen: Soft, non tender and non distended. No masses, hepatosplenomegaly or hernias noted. Normal Bowel sounds Rectal: Deferred to colonoscopy Musculoskeletal: Symmetrical with no gross deformities  Skin: No lesions on visible extremities Pulses:  Normal pulses noted Extremities: No clubbing, cyanosis, edema or deformities noted Neurological: Alert oriented x 4, grossly nonfocal Cervical Nodes:  No significant cervical adenopathy Inguinal Nodes: No significant inguinal adenopathy Psychological:  Alert and cooperative. Normal mood and affect  Assessment and Recommendations:  1. Diarrhea and weight loss, gradually improved without a clear etiology. Rule out infectious process, microscopic colitis. Obtain GI pathogen panel. Schedule colonoscopy. The risks (including bleeding, perforation, infection, missed lesions, medication reactions and possible hospitalization or surgery if complications occur), benefits, and alternatives to colonoscopy with possible biopsy and possible polypectomy were discussed with the patient and they consent to proceed.   2. Dysphagia and GERD. Resume Protonix 40 mg daily and follow standard antireflux measures. Schedule EGD with possible dilation. The risks (including bleeding, perforation, infection, missed lesions, medication reactions and possible hospitalization or surgery if complications occur), benefits, and alternatives to endoscopy with possible biopsy and possible dilation were discussed with the patient and they consent to proceed.   3. Hepatic steatosis likely cause of minimally elevated ALT. Long-term low fat, carb modified diet with a weight loss program  supervised by her PCP. Recommended that her PCP monitors LFTs every 6-12 months.  4. Chronic right flank pain related to lower right lateral ribs.  Follow up with PCP.      cc: Timmothy Euler, MD [address] [place], [place] [address]

## 2016-06-03 NOTE — Patient Instructions (Signed)
You have been scheduled for an endoscopy and colonoscopy. Please follow the written instructions given to you at your visit today. Please pick up your prep supplies at the pharmacy within the next 1-3 days. If you use inhalers (even only as needed), please bring them with you on the day of your procedure. Your physician has requested that you go to www.[email or web address].[email or web address] and enter the access code given to you at your visit today. This web site gives a general overview about your procedure. However, you should still follow specific instructions given to you by our office regarding your preparation for the procedure.  Your physician has requested that you go to the basement for lab work before leaving today. 

## 2016-06-05 ENCOUNTER — Ambulatory Visit (AMBULATORY_SURGERY_CENTER): Payer: Medicare Other | Admitting: Gastroenterology

## 2016-06-05 ENCOUNTER — Encounter: Payer: Self-pay | Admitting: Gastroenterology

## 2016-06-05 VITALS — BP 125/65 | HR 77 | Temp 98.6°F | Resp 19 | Ht 64.0 in | Wt 201.0 lb

## 2016-06-05 DIAGNOSIS — R1319 Other dysphagia: Secondary | ICD-10-CM

## 2016-06-05 DIAGNOSIS — R197 Diarrhea, unspecified: Secondary | ICD-10-CM | POA: Diagnosis not present

## 2016-06-05 DIAGNOSIS — R1310 Dysphagia, unspecified: Secondary | ICD-10-CM

## 2016-06-05 DIAGNOSIS — R1314 Dysphagia, pharyngoesophageal phase: Secondary | ICD-10-CM

## 2016-06-05 MED ORDER — SODIUM CHLORIDE 0.9 % IV SOLN: 500.0000 mL | INTRAVENOUS | Status: DC

## 2016-06-05 NOTE — Progress Notes (Signed)
Dental advisory given to patient 

## 2016-06-05 NOTE — Progress Notes (Signed)
No problems noted in the recovery room. maw 

## 2016-06-05 NOTE — Op Note (Signed)
[place] Patient Name: Kristie Lewis Procedure Date: 06/05/2016 10:42 AM MRN: [id number] Endoscopist: Ladene Artist , MD Age: 65 Referring MD:  Date of Birth: [birth year] Gender: Female Account #: [id number] Procedure:                Colonoscopy Indications:              Clinically significant diarrhea of unexplained                            origin Medicines:                Monitored Anesthesia Care Procedure:                Pre-Anesthesia Assessment:                           - Prior to the procedure, a History and Physical                            was performed, and patient medications and                            allergies were reviewed. The patient's tolerance of                            previous anesthesia was also reviewed. The risks                            and benefits of the procedure and the sedation                            options and risks were discussed with the patient.                            All questions were answered, and informed consent                            was obtained. Prior Anticoagulants: The patient has                            taken no previous anticoagulant or antiplatelet                            agents. ASA Grade Assessment: II - A patient with                            mild systemic disease. After reviewing the risks                            and benefits, the patient was deemed in                            satisfactory condition to undergo the procedure.  After obtaining informed consent, the colonoscope                            was passed under direct vision. Throughout the                            procedure, the patient's blood pressure, pulse, and                            oxygen saturations were monitored continuously. The                            Model PCF-H190L [id number]) scope was introduced                            through the anus and advanced to the the cecum,                        identified by appendiceal orifice and ileocecal                            valve. The ileocecal valve, appendiceal orifice,                            and rectum were photographed. The quality of the                            bowel preparation was good. The colonoscopy was                            performed without difficulty. The patient tolerated                            the procedure well. Scope In: 11:07:28 AM Scope Out: 11:21:41 AM Scope Withdrawal Time: 0 hours 10 minutes 37 seconds  Total Procedure Duration: 0 hours 14 minutes 13 seconds  Findings:                 Many small-mouthed diverticula were found in the                            sigmoid colon. There was no evidence of                            diverticular bleeding.                           The exam was otherwise without abnormality on                            direct and retroflexion views. Random biopsies                            taken throughout the colon. Several attempts were  made to enter the TI and they were not successful. Complications:            No immediate complications. Estimated blood loss:                            None. Estimated Blood Loss:     Estimated blood loss: none. Impression:               - Moderate diverticulosis in the sigmoid colon.                            There was no evidence of diverticular bleeding.                           - The examination was otherwise normal on direct                            and retroflexion views.                           - No specimens collected. Recommendation:           - Repeat colonoscopy in 10 years for screening                            purposes.                           - Patient has a contact number available for                            emergencies. The signs and symptoms of potential                            delayed complications were discussed with the                             patient. Return to normal activities tomorrow.                            Written discharge instructions were provided to the                            patient.                           - Resume previous diet.                           - Continue present medications.                           - Await pathology results. Ladene Artist, MD 06/05/2016 11:25:44 AM This report has been signed electronically.

## 2016-06-05 NOTE — Op Note (Signed)
Kristie Lewis: Kristie Lewis Procedure Date: 06/05/2016 11:23 AM MRN: [id number] Endoscopist: Ladene Artist , MD Age: 65 Referring MD:  Date of Birth: 1951/07/31 Gender: Female Account #: [id number] Procedure:                Upper GI endoscopy Indications:              Dysphagia Medicines:                Monitored Anesthesia Care Procedure:                Pre-Anesthesia Assessment:                           - Prior to the procedure, a History and Physical                            was performed, and patient medications and                            allergies were reviewed. The patient's tolerance of                            previous anesthesia was also reviewed. The risks                            and benefits of the procedure and the sedation                            options and risks were discussed with the patient.                            All questions were answered, and informed consent                            was obtained. Prior Anticoagulants: The patient has                            taken no previous anticoagulant or antiplatelet                            agents. ASA Grade Assessment: II - A patient with                            mild systemic disease. After reviewing the risks                            and benefits, the patient was deemed in                            satisfactory condition to undergo the procedure.                           After obtaining informed consent, the endoscope was  passed under direct vision. Throughout the                            procedure, the patient's blood pressure, pulse, and                            oxygen saturations were monitored continuously. The                            Model GIF-HQ190 [id number]) scope was introduced                            through the mouth, and advanced to the second part                            of duodenum. The upper GI endoscopy was                            accomplished without difficulty. The patient                            tolerated the procedure well. Scope In: Scope Out: Findings:                 One moderate benign-appearing, intrinsic stenosis                            was found 39 cm from the incisors. This measured                            1.1 cm (inner diameter) and was traversed. A                            guidewire was placed and the scope was withdrawn.                            Dilation was performed with a Savary dilator with                            mild resistance at 12 mm, 79mm and 14 mm. Estimated                            blood loss: none.                           The exam of the esophagus was otherwise normal.                           A medium-sized hiatal hernia was present.                           The exam of the stomach was otherwise normal.                           The  duodenal bulb and second portion of the                            duodenum were normal. Complications:            No immediate complications. Estimated Blood Loss:     Estimated blood loss: none. Impression:               - Benign-appearing esophageal stenosis. Dilated.                           - Medium-sized hiatal hernia.                           - Normal duodenal bulb and second portion of the                            duodenum.                           - No specimens collected. Recommendation:           - Patient has a contact number available for                            emergencies. The signs and symptoms of potential                            delayed complications were discussed with the                            patient. Return to normal activities tomorrow.                            Written discharge instructions were provided to the                            patient.                           - Clear liquid diet for 2 hours, then advance as                            tolerated to soft diet  today and resume prior diet                            tomorrow.                           - Continue present medications including Protonix                            40 mg qam long term.                           - Return to my office in 1 month. Ladene Artist, MD 06/05/2016 11:36:47 AM This report has been signed electronically.

## 2016-06-05 NOTE — Progress Notes (Signed)
Called to room to assist during endoscopic procedure.  Patient ID and intended procedure confirmed with present staff. Received instructions for my participation in the procedure from the performing physician.  

## 2016-06-05 NOTE — Progress Notes (Signed)
A/ox3 pleased with MAC, report to Annette RN 

## 2016-06-05 NOTE — Patient Instructions (Addendum)
Handouts were given to your care partner on diverticulosis, a high fiber diet with liberal fluid intake, hiatal hernia and Esophageal stricture with a dilatation diet to follow the rest of the day. You may resume your current medications today.  Continue taking Protonix 40 mg daily each am long term. Await biopsy results. Return for office visit with Dr. Fuller Plan in one month. Please call if any questions or concerns.   YOU HAD AN ENDOSCOPIC PROCEDURE TODAY AT [facility]:   Refer to the procedure report that was given to you for any specific questions about what was found during the examination.  If the procedure report does not answer your questions, please call your gastroenterologist to clarify.  If you requested that your care partner not be given the details of your procedure findings, then the procedure report has been included in a sealed envelope for you to review at your convenience later.  YOU SHOULD EXPECT: Some feelings of bloating in the abdomen. Passage of more gas than usual.  Walking can help get rid of the air that was put into your GI tract during the procedure and reduce the bloating. If you had a lower endoscopy (such as a colonoscopy or flexible sigmoidoscopy) you may notice spotting of blood in your stool or on the toilet paper. If you underwent a bowel prep for your procedure, you may not have a normal bowel movement for a few days.  Please Note:  You might notice some irritation and congestion in your nose or some drainage.  This is from the oxygen used during your procedure.  There is no need for concern and it should clear up in a day or so.  SYMPTOMS TO REPORT IMMEDIATELY:   Following lower endoscopy (colonoscopy or flexible sigmoidoscopy):  Excessive amounts of blood in the stool  Significant tenderness or worsening of abdominal pains  Swelling of the abdomen that is new, acute  Fever of 100F or higher   Following upper endoscopy (EGD)  Vomiting of  blood or coffee ground material  New chest pain or pain under the shoulder blades  Painful or persistently difficult swallowing  New shortness of breath  Fever of 100F or higher  Black, tarry-looking stools  For urgent or emergent issues, a gastroenterologist can be reached at any hour by calling [phone].   DIET:  Please follow the dilatation diet the rest of today.  Handout was provided. Drink plenty of fluids but you should avoid alcoholic beverages for 24 hours.  ACTIVITY:  You should plan to take it easy for the rest of today and you should NOT DRIVE or use heavy machinery until tomorrow (because of the sedation medicines used during the test).    FOLLOW UP: Our staff will call the number listed on your records the next business day following your procedure to check on you and address any questions or concerns that you may have regarding the information given to you following your procedure. If we do not reach you, we will leave a message.  However, if you are feeling well and you are not experiencing any problems, there is no need to return our call.  We will assume that you have returned to your regular daily activities without incident.  If any biopsies were taken you will be contacted by phone or by letter within the next 1-3 weeks.  Please call us at [phone] if you have not heard about the biopsies in 3 weeks.    SIGNATURES/CONFIDENTIALITY:  You and/or your care partner have signed paperwork which will be entered into your electronic medical record.  These signatures attest to the fact that that the information above on your After Visit Summary has been reviewed and is understood.  Full responsibility of the confidentiality of this discharge information lies with you and/or your care-partner.

## 2016-06-08 ENCOUNTER — Ambulatory Visit: Payer: Medicare Other | Admitting: Family Medicine

## 2016-06-08 ENCOUNTER — Telehealth: Payer: Self-pay | Admitting: *Deleted

## 2016-06-08 NOTE — Telephone Encounter (Signed)
  Follow up Call-  Call back number 06/05/2016  Post procedure Call Back phone  # [phone]  Permission to leave phone message Yes  Some recent data might be hidden     Patient questions:  Do you have a fever, pain , or abdominal swelling? No. Pain Score  0 *  Have you tolerated food without any problems? Yes.    Have you been able to return to your normal activities? Yes.    Do you have any questions about your discharge instructions: Diet   No. Medications  No. Follow up visit  No.  Do you have questions or concerns about your Care? No.  Actions: * If pain score is 4 or above: No action needed, pain <4.

## 2016-06-09 ENCOUNTER — Telehealth: Payer: Self-pay | Admitting: Family Medicine

## 2016-06-09 MED ORDER — PANTOPRAZOLE SODIUM 40 MG PO TBEC: 40.0000 mg | DELAYED_RELEASE_TABLET | Freq: Every day | ORAL | 5 refills | Status: DC

## 2016-06-09 NOTE — Telephone Encounter (Signed)
Rx Sent  Laroy Apple, MD [facility] 06/09/2016, 10:11 AM

## 2016-06-16 ENCOUNTER — Other Ambulatory Visit: Payer: Self-pay

## 2016-06-16 MED ORDER — BUDESONIDE 3 MG PO CPEP: 9.0000 mg | ORAL_CAPSULE | Freq: Every day | ORAL | 3 refills | Status: DC

## 2016-06-22 ENCOUNTER — Telehealth: Payer: Self-pay | Admitting: Gastroenterology

## 2016-06-23 ENCOUNTER — Telehealth: Payer: Self-pay | Admitting: Gastroenterology

## 2016-06-23 NOTE — Telephone Encounter (Signed)
Patient states Entocort is too expensive and wants to know if there is something else we can send in it's place. Please advise.

## 2016-06-23 NOTE — Telephone Encounter (Signed)
Informed patient that another option is for [place] to send budesonide 10 mg compound to [facility] that is 100 dollars for a month supply. Patient states she really wants to try to get the budesonide 9 mg that is 300 dollars. I informed patient to call me back if she changes her mind. Patient verbalized understanding.

## 2016-06-23 NOTE — Telephone Encounter (Signed)
Informed patient that it's not what we prefer to patient's on for microscopic colitis. Prednisone does have more side effects than budesonide. Offered to send to Dr. Fuller Plan to ask if she can be switched to prednisone instead. Patient states, no thanks. Patient states she will pick up the budesonide 9mg  so she does not have to drive to [facility] for the compounded budesonide either.

## 2016-06-23 NOTE — Telephone Encounter (Signed)
Budesonide 10 mg qd compounded. Check with [facility] on where to order

## 2016-06-29 ENCOUNTER — Telehealth: Payer: Self-pay | Admitting: Gastroenterology

## 2016-06-30 NOTE — Telephone Encounter (Signed)
Patient reports that she has been on entocort for about 2/1/2 weeks now.  She is no longer having diarrhea at all.  Is experiencing some constipation.  She is asking if she can decrease her dose?

## 2016-06-30 NOTE — Telephone Encounter (Signed)
Left message for patient to call back  

## 2016-06-30 NOTE — Telephone Encounter (Signed)
Patient notified of the recomendations

## 2016-06-30 NOTE — Telephone Encounter (Signed)
To Dr. Stark 

## 2016-06-30 NOTE — Telephone Encounter (Signed)
Decrease budesonide to 6 mg daily

## 2016-07-09 ENCOUNTER — Ambulatory Visit: Payer: Medicare Other | Admitting: Gastroenterology

## 2016-08-12 ENCOUNTER — Ambulatory Visit (INDEPENDENT_AMBULATORY_CARE_PROVIDER_SITE_OTHER): Payer: Medicare Other | Admitting: Family Medicine

## 2016-08-12 VITALS — BP 109/65 | HR 61 | Temp 97.1°F | Ht 64.0 in | Wt 206.0 lb

## 2016-08-12 DIAGNOSIS — I10 Essential (primary) hypertension: Secondary | ICD-10-CM | POA: Diagnosis not present

## 2016-08-12 DIAGNOSIS — M10071 Idiopathic gout, right ankle and foot: Secondary | ICD-10-CM | POA: Diagnosis not present

## 2016-08-12 MED ORDER — COLCHICINE 0.6 MG PO TABS
ORAL_TABLET | ORAL | 1 refills | Status: DC
Start: 2016-08-12 — End: 2017-07-02

## 2016-08-12 NOTE — Progress Notes (Signed)
Subjective:  Patient ID: Kristie Lewis, female    DOB: April 23, 1951  Age: 65 y.o. MRN: [id number]  CC: Toe on right foot red and swollen   HPI Kristie Lewis presents for 6/10 pain. 10/10 at onset 5 days ago.Hx of borderline hyperuricemia Patient states that she ate a little bit of [place] and chicken but not very much on Thanksgiving. It has been difficult to walk since 2 days after. It's a bit better over the last 2 days . Pain has been been less severe. There has been no injury. No trauma. Onset was sudden. She has a somewhat elevated creatinine because of only having 1 kidney.  History Kristie Lewis has a past medical history of Arthritis; Cataract; GERD (gastroesophageal reflux disease); Hyperlipidemia; Hypertension; Hypokalemia; Microscopic colitis; and Renal cancer ([place]) (2010).   She has a past surgical history that includes Cesarean section; Breast surgery; Tubal ligation; right nephrectomy; Hernia repair; Colonoscopy; and Upper gastrointestinal endoscopy.   Her family history includes Diabetes in her father; Heart disease in her father; Hyperlipidemia in her mother; Hypertension in her father; Stroke in her father and mother.She reports that she has never smoked. She has never used smokeless tobacco. She reports that she does not drink alcohol or use drugs.    ROS Review of Systems  Constitutional: Negative for activity change, appetite change and fever.  HENT: Negative for congestion, rhinorrhea and sore throat.   Eyes: Negative for visual disturbance.  Respiratory: Negative for cough and shortness of breath.   Cardiovascular: Negative for chest pain and palpitations.  Gastrointestinal: Negative for abdominal pain, diarrhea and nausea.  Genitourinary: Negative for dysuria.  Musculoskeletal: Negative for arthralgias and myalgias.    Objective:  BP 109/65   Pulse 61   Temp 97.1 F (36.2 C) (Oral)   Ht '5\' 4"'  (1.626 m)   Wt 206 lb (93.4 kg)   BMI 35.36 kg/m   BP Readings from  Last 3 Encounters:  08/12/16 109/65  06/05/16 125/65  06/03/16 120/72    Wt Readings from Last 3 Encounters:  08/12/16 206 lb (93.4 kg)  06/05/16 201 lb (91.2 kg)  06/03/16 201 lb 3.2 oz (91.3 kg)     Physical Exam  Constitutional: She is oriented to person, place, and time. She appears well-developed and well-nourished. No distress.  HENT:  Head: Normocephalic and atraumatic.  Eyes: Conjunctivae are normal. Pupils are equal, round, and reactive to light.  Neck: Normal range of motion. Neck supple. No thyromegaly present.  Pulmonary/Chest: Effort normal. No respiratory distress.  Abdominal: Soft.  Musculoskeletal: Normal range of motion.  Lymphadenopathy:    She has no cervical adenopathy.  Neurological: She is alert and oriented to person, place, and time.  Skin: Skin is warm and dry.  Right second toe has well demarcated erythema from the PIP to the DIP region dorsally extending medially into the plantar surface as well. It is moderately edematous. It is very tender. No other lesions noted on either foot.  Psychiatric: She has a normal mood and affect. Her behavior is normal. Judgment and thought content normal.     Lab Results  Component Value Date   WBC 6.3 04/28/2016   HGB 13.8 04/19/2015   HCT 41.9 04/28/2016   PLT 219 04/28/2016   GLUCOSE 111 (H) 04/28/2016   CHOL 365 (H) 04/19/2015   TRIG 237 (H) 04/19/2015   HDL 40 04/19/2015   LDLCALC 278 (H) 04/19/2015   ALT 36 (H) 04/28/2016   AST 27 04/28/2016  NA 141 04/28/2016   K 4.1 04/28/2016   CL 100 04/28/2016   CREATININE 0.99 04/28/2016   BUN 9 04/28/2016   CO2 25 04/28/2016   TSH 2.550 04/19/2015   HGBA1C 5.6% 08/02/2013    Nm Hepato W/eject Fract  Result Date: 05/06/2016 CLINICAL DATA:  Pain. EXAM: NUCLEAR MEDICINE HEPATOBILIARY IMAGING WITH GALLBLADDER EF TECHNIQUE: Sequential images of the abdomen were obtained out to 60 minutes following intravenous administration of radiopharmaceutical. After oral  ingestion of Ensure, gallbladder ejection fraction was determined. At 60 min, normal ejection fraction is greater than 33%. RADIOPHARMACEUTICALS:  5.0 mCi Tc-69m Choletec IV COMPARISON:  Ultrasound 04/29/2016. FINDINGS: Prompt uptake and biliary excretion of activity by the liver is seen. Gallbladder activity is visualized, consistent with patency of cystic duct. Biliary activity passes into small bowel, consistent with patent common bile duct. Calculated gallbladder ejection fraction is 59%. (Normal gallbladder ejection fraction with Ensure is greater than 33%.) IMPRESSION: Normal exam. Electronically Signed   By: TMarcello Moores Register   On: 05/06/2016 10:55    Assessment & Plan:   JPattonwas seen today for toe on right foot red and swollen.  Diagnoses and all orders for this visit:  Essential hypertension  Acute idiopathic gout involving toe of right foot -     CMP14+EGFR -     Uric acid  Other orders -     colchicine 0.6 MG tablet; Take 2 at onset of attack. Take one every 2 hours, up to 6 total.then twice daily until symptoms clear    I discussed the use of allopurinol/ULoric. Due to her elevated creatinine Will defer that to her primary, Dr. BWendi Snipes She will see him in follow-up in 2 weeks.  I have discontinued [name]'s pantoprazole. I am also having her start on colchicine. Additionally, I am having her maintain her B Complex-C (SUPER B COMPLEX PO), aspirin EC, Misc Natural Products (OSTEO BI-FLEX ADV DOUBLE ST PO), Krill Oil, co-enzyme Q-10, L-Lysine, DULoxetine, furosemide, atenolol, potassium chloride SA, Red Yeast Rice, and budesonide. We will stop administering sodium chloride.  Meds ordered this encounter  Medications  . budesonide (ENTOCORT EC) 3 MG 24 hr capsule    Sig: Take by mouth daily.  . colchicine 0.6 MG tablet    Sig: Take 2 at onset of attack. Take one every 2 hours, up to 6 total.then twice daily until symptoms clear    Dispense:  30 tablet    Refill:  1      Follow-up: Return in about 2 weeks (around 08/26/2016) for gout - with Dr. BWendi Snipes  WClaretta Fraise M.D.

## 2016-08-13 ENCOUNTER — Other Ambulatory Visit: Payer: Self-pay | Admitting: Family Medicine

## 2016-08-13 LAB — CMP14+EGFR
ALBUMIN: 4.3 g/dL (ref 3.6–4.8)
ALT: 34 IU/L — ABNORMAL HIGH (ref 0–32)
AST: 24 IU/L (ref 0–40)
Albumin/Globulin Ratio: 1.4 (ref 1.2–2.2)
Alkaline Phosphatase: 77 IU/L (ref 39–117)
BUN/Creatinine Ratio: 14 (ref 12–28)
BUN: 16 mg/dL (ref 8–27)
Bilirubin Total: 0.5 mg/dL (ref 0.0–1.2)
CALCIUM: 9.6 mg/dL (ref 8.7–10.3)
CO2: 23 mmol/L (ref 18–29)
CREATININE: 1.13 mg/dL — AB (ref 0.57–1.00)
Chloride: 98 mmol/L (ref 96–106)
GFR calc Af Amer: 59 mL/min/{1.73_m2} — ABNORMAL LOW (ref >59)
GFR, EST NON AFRICAN AMERICAN: 51 mL/min/{1.73_m2} — AB (ref >59)
GLOBULIN, TOTAL: 3 g/dL (ref 1.5–4.5)
Glucose: 88 mg/dL (ref 65–99)
Potassium: 4.2 mmol/L (ref 3.5–5.2)
SODIUM: 141 mmol/L (ref 134–144)
Total Protein: 7.3 g/dL (ref 6.0–8.5)

## 2016-08-13 LAB — URIC ACID: Uric Acid: 8 mg/dL — ABNORMAL HIGH (ref 2.5–7.1)

## 2016-08-24 ENCOUNTER — Encounter: Payer: Self-pay | Admitting: Gastroenterology

## 2016-08-24 ENCOUNTER — Ambulatory Visit (INDEPENDENT_AMBULATORY_CARE_PROVIDER_SITE_OTHER): Payer: Medicare Other | Admitting: Gastroenterology

## 2016-08-24 VITALS — BP 118/72 | HR 72 | Ht 62.5 in | Wt 202.0 lb

## 2016-08-24 DIAGNOSIS — K52839 Microscopic colitis, unspecified: Secondary | ICD-10-CM

## 2016-08-24 NOTE — Patient Instructions (Signed)
Continue your budesonide every other day x 1 week then discontinue.   Call back if you have recurrent symptoms.   Thank you for choosing me and [facility].  Pricilla Riffle. Dagoberto Ligas., MD., Marval Regal

## 2016-08-24 NOTE — Progress Notes (Signed)
    History of Present Illness: This is a 65 year old female returning for follow-up of microscopic colitis. She is accompanied by her husband. She states within a week after beginning budesonide 9 mg a day her diarrhea resolved and she has been having 1-2 formed bowel movements per day. She decreased her budesonide dose to 3 mg a day on her on about 6-7 weeks ago.  Current Medications, Allergies, Past Medical History, Past Surgical History, Family History and Social History were reviewed in [employer] record.  Physical Exam: General: Well developed, well nourished, no acute distress Head: Normocephalic and atraumatic Eyes:  sclerae anicteric, EOMI Ears: Normal auditory acuity Mouth: No deformity or lesions Lungs: Clear throughout to auscultation Heart: Regular rate and rhythm; no murmurs, rubs or bruits Abdomen: Soft, non tender and non distended. No masses, hepatosplenomegaly or hernias noted. Normal Bowel sounds Musculoskeletal: Symmetrical with no gross deformities  Pulses:  Normal pulses noted Extremities: No clubbing, cyanosis, edema or deformities noted Neurological: Alert oriented x 4, grossly nonfocal Psychological:  Alert and cooperative. Normal mood and affect  Assessment and Recommendations:  1. Microscopic colitis. Change budesonide 3 mg every other day for 1 week and then discontinue. She is advised to contact us if her diarrhea returns to resume budesonide likely at 3 or 6 mg daily. GI follow-up as needed.

## 2016-08-25 ENCOUNTER — Other Ambulatory Visit: Payer: Self-pay | Admitting: Family Medicine

## 2016-08-27 ENCOUNTER — Encounter: Payer: Self-pay | Admitting: Family Medicine

## 2016-08-27 ENCOUNTER — Ambulatory Visit (INDEPENDENT_AMBULATORY_CARE_PROVIDER_SITE_OTHER): Payer: Medicare Other | Admitting: Family Medicine

## 2016-08-27 VITALS — BP 124/75 | HR 83 | Temp 96.8°F | Ht 62.5 in | Wt 204.4 lb

## 2016-08-27 DIAGNOSIS — M109 Gout, unspecified: Secondary | ICD-10-CM | POA: Diagnosis not present

## 2016-08-27 NOTE — Patient Instructions (Signed)
Great to see you!  Consider stopping lasix and wearing compression stockings as needed  Use colchicine if needed  Consider uloric, you would need daily colchicine for 4-8 weeks while starting.

## 2016-08-27 NOTE — Progress Notes (Signed)
   HPI  Patient presents today here to follow-up for gout.  Patient states that her symptoms have improved almost completely. She has a small white spot developing over the lump on her right second toe. She states colchicine kicked in quite quickly.  She takes Lasix daily for leg swelling, she will consider stopping this and using compression stockings as needed.  Patient states that she has reviewed the gout diet and is considering some changes.  She does not want to start preventative medications yet, she did have one episode over the summer which she mistakenly thought was a bug bite.  PMH: Smoking status noted ROS: Per HPI  Objective: BP 124/75   Pulse 83   Temp (!) 96.8 F (36 C) (Oral)   Ht 5' 2.5" (1.588 m)   Wt 204 lb 6.4 oz (92.7 kg)   BMI 36.79 kg/m  Gen: NAD, alert, cooperative with exam HEENT: NCAT CV: RRR, good S1/S2, no murmur Resp: CTABL, no wheezes, non-labored Ext: No edema, warm Neuro: Alert and oriented, No gross deficits  MSK Tophus on R second toe, small white lesion, approx 1 mm in diameter  Assessment and plan:  # Gout, acute gout Flare resolved Discussed with patient that Lasix likely exacerbates the symptoms, recommended stopping lasix and trying compression stockings Offered starting Uloric today, recommended taking daily colchicine for 4-8 weeks while starting Patient will consider this as well,.  For now she will keep colchicine on hand. Discussed possibilty of erosive arthropathy with recurrent flares.  White lesion may represent uric acid, pt to monitor closely  # HCM Discussed PNA vaccines and she declines.    Laroy Apple, MD [facility] 08/27/2016, 2:16 PM

## 2016-11-06 ENCOUNTER — Telehealth: Payer: Self-pay | Admitting: Gastroenterology

## 2016-11-06 NOTE — Telephone Encounter (Signed)
Patient reports that diarrhea has resumed.  She wanted you to know that she started back on her budesonide 1 every other day and this is helping.

## 2016-11-08 NOTE — Telephone Encounter (Signed)
OK 

## 2016-11-10 DIAGNOSIS — Z1231 Encounter for screening mammogram for malignant neoplasm of breast: Secondary | ICD-10-CM | POA: Diagnosis not present

## 2016-12-28 ENCOUNTER — Other Ambulatory Visit: Payer: Self-pay | Admitting: Family Medicine

## 2017-01-04 ENCOUNTER — Encounter: Payer: Self-pay | Admitting: Family Medicine

## 2017-01-04 ENCOUNTER — Ambulatory Visit (INDEPENDENT_AMBULATORY_CARE_PROVIDER_SITE_OTHER): Payer: Medicare Other

## 2017-01-04 ENCOUNTER — Ambulatory Visit (INDEPENDENT_AMBULATORY_CARE_PROVIDER_SITE_OTHER): Payer: Medicare Other | Admitting: Family Medicine

## 2017-01-04 VITALS — BP 117/75 | HR 56 | Temp 98.2°F | Ht 62.5 in | Wt 205.4 lb

## 2017-01-04 DIAGNOSIS — R05 Cough: Secondary | ICD-10-CM

## 2017-01-04 DIAGNOSIS — G8929 Other chronic pain: Secondary | ICD-10-CM | POA: Diagnosis not present

## 2017-01-04 DIAGNOSIS — I10 Essential (primary) hypertension: Secondary | ICD-10-CM

## 2017-01-04 DIAGNOSIS — R059 Cough, unspecified: Secondary | ICD-10-CM

## 2017-01-04 DIAGNOSIS — E785 Hyperlipidemia, unspecified: Secondary | ICD-10-CM | POA: Diagnosis not present

## 2017-01-04 DIAGNOSIS — M791 Myalgia: Secondary | ICD-10-CM | POA: Diagnosis not present

## 2017-01-04 DIAGNOSIS — M7918 Myalgia, other site: Secondary | ICD-10-CM

## 2017-01-04 MED ORDER — GABAPENTIN 300 MG PO CAPS: 300.0000 mg | ORAL_CAPSULE | Freq: Every day | ORAL | 2 refills | Status: DC

## 2017-01-04 NOTE — Patient Instructions (Signed)
Great to see yoU!  We will call with your labs within 1 week.   Start gabapentin 1 pill once daily at night  Come back in 2 months to see how the medicine is working.

## 2017-01-04 NOTE — Progress Notes (Signed)
   HPI  Patient presents today here to discuss chronic medical conditions.  Cough One week of cough with frequent throat clearing. No fever, chills, sweats, or shortness of breath.  Hypertension Good medication compliance.  Chronic musculoskeletal pain, likely fibromyalgia underlying. Patient also with some irritability lately. Cymbalta seem to help quite a bit in the beginning, it's more questionable now. Patient having nighttime pain and some difficulty sleeping, however her tiredness most of the time.   PMH: Smoking status noted ROS: Per HPI  Objective: BP 117/75   Pulse (!) 56   Temp 98.2 F (36.8 C) (Oral)   Ht 5' 2.5" (1.588 m)   Wt 205 lb 6.4 oz (93.2 kg)   BMI 36.97 kg/m  Gen: NAD, alert, cooperative with exam HEENT: NCAT, oropharynx clear, nares clear, TMs normal bilaterally CV: RRR, good S1/S2, no murmur Resp: CTABL, no wheezes, non-labored Abdomen: Soft, nontender, nondistended, positive bowel sounds Ext: No edema, warm Neuro: Alert and oriented, No gross deficits  Assessment and plan:  # Cough Most likely postnasal drip, patient requesting chest x-ray Plain film pending. Recommended plain Claritin once daily  # Hypertension Well-controlled on current medications, no changes. Continue atenolol, Lasix  # Chronic musculoskeletal pain With nighttime pain and some difficulty sleeping, add gabapentin, continue Cymbalta  # Hyperlipidemia Extremely high LDL previously, patient states that she has tried "all statins" No ASCVD. Patient states that she has also tried Lopid, cholestyramine, and ezetimibe Encouraged lifestyle modification    Orders Placed This Encounter  Procedures  . DG Chest 2 View    Standing Status:   Future    Number of Occurrences:   1    Standing Expiration Date:   03/06/2018    Order Specific Question:   Reason for Exam (SYMPTOM  OR DIAGNOSIS REQUIRED)    Answer:   cough    Order Specific Question:   Preferred imaging  location?    Answer:   Internal    Order Specific Question:   Radiology Contrast Protocol - do NOT remove file path    Answer:   \\[email or web address].pdf  . CMP14+EGFR  . CBC with Differential/Platelet  . Lipid panel  . TSH    Meds ordered this encounter  Medications  . Famotidine (PEPCID PO)    Sig: Take by mouth.  . UNABLE TO FIND    Sig: Always eye drops  . gabapentin (NEURONTIN) 300 MG capsule    Sig: Take 1 capsule (300 mg total) by mouth at bedtime.    Dispense:  30 capsule    Refill:  2    Sam Bradshaw, MD [facility] 01/04/2017, 11:50 AM     

## 2017-01-05 LAB — TSH: TSH: 2.23 u[IU]/mL (ref 0.450–4.500)

## 2017-01-05 LAB — LIPID PANEL
CHOL/HDL RATIO: 7.9 ratio — AB (ref 0.0–4.4)
Cholesterol, Total: 307 mg/dL — ABNORMAL HIGH (ref 100–199)
HDL: 39 mg/dL — AB (ref >39)
LDL CALC: 220 mg/dL — AB (ref 0–99)
Triglycerides: 240 mg/dL — ABNORMAL HIGH (ref 0–149)
VLDL CHOLESTEROL CAL: 48 mg/dL — AB (ref 5–40)

## 2017-01-05 LAB — CMP14+EGFR
A/G RATIO: 1.2 (ref 1.2–2.2)
ALBUMIN: 4.1 g/dL (ref 3.6–4.8)
ALT: 45 IU/L — AB (ref 0–32)
AST: 37 IU/L (ref 0–40)
Alkaline Phosphatase: 73 IU/L (ref 39–117)
BILIRUBIN TOTAL: 0.5 mg/dL (ref 0.0–1.2)
BUN / CREAT RATIO: 12 (ref 12–28)
BUN: 13 mg/dL (ref 8–27)
CO2: 24 mmol/L (ref 18–29)
CREATININE: 1.12 mg/dL — AB (ref 0.57–1.00)
Calcium: 10 mg/dL (ref 8.7–10.3)
Chloride: 101 mmol/L (ref 96–106)
GFR calc Af Amer: 59 mL/min/{1.73_m2} — ABNORMAL LOW (ref >59)
GFR, EST NON AFRICAN AMERICAN: 51 mL/min/{1.73_m2} — AB (ref >59)
GLOBULIN, TOTAL: 3.3 g/dL (ref 1.5–4.5)
Glucose: 114 mg/dL — ABNORMAL HIGH (ref 65–99)
Potassium: 5.1 mmol/L (ref 3.5–5.2)
SODIUM: 140 mmol/L (ref 134–144)
TOTAL PROTEIN: 7.4 g/dL (ref 6.0–8.5)

## 2017-01-05 LAB — CBC WITH DIFFERENTIAL/PLATELET
BASOS: 1 %
Basophils Absolute: 0.1 10*3/uL (ref 0.0–0.2)
EOS (ABSOLUTE): 0.1 10*3/uL (ref 0.0–0.4)
EOS: 2 %
HEMATOCRIT: 40.3 % (ref 34.0–46.6)
HEMOGLOBIN: 13 g/dL (ref 11.1–15.9)
Immature Grans (Abs): 0 10*3/uL (ref 0.0–0.1)
Immature Granulocytes: 0 %
Lymphocytes Absolute: 2.5 10*3/uL (ref 0.7–3.1)
Lymphs: 39 %
MCH: 29.8 pg (ref 26.6–33.0)
MCHC: 32.3 g/dL (ref 31.5–35.7)
MCV: 92 fL (ref 79–97)
MONOCYTES: 8 %
MONOS ABS: 0.5 10*3/uL (ref 0.1–0.9)
NEUTROS PCT: 50 %
Neutrophils Absolute: 3.3 10*3/uL (ref 1.4–7.0)
Platelets: 278 10*3/uL (ref 150–379)
RBC: 4.36 x10E6/uL (ref 3.77–5.28)
RDW: 14.2 % (ref 12.3–15.4)
WBC: 6.4 10*3/uL (ref 3.4–10.8)

## 2017-01-21 ENCOUNTER — Other Ambulatory Visit: Payer: Self-pay | Admitting: Family Medicine

## 2017-02-24 ENCOUNTER — Other Ambulatory Visit: Payer: Self-pay | Admitting: Family Medicine

## 2017-03-09 ENCOUNTER — Telehealth: Payer: Self-pay

## 2017-03-09 ENCOUNTER — Ambulatory Visit (INDEPENDENT_AMBULATORY_CARE_PROVIDER_SITE_OTHER): Payer: Medicare Other | Admitting: Family Medicine

## 2017-03-09 ENCOUNTER — Encounter: Payer: Self-pay | Admitting: Family Medicine

## 2017-03-09 VITALS — BP 119/82 | HR 74 | Temp 97.1°F | Ht 62.5 in | Wt 203.0 lb

## 2017-03-09 DIAGNOSIS — M7918 Myalgia, other site: Secondary | ICD-10-CM

## 2017-03-09 DIAGNOSIS — I10 Essential (primary) hypertension: Secondary | ICD-10-CM | POA: Diagnosis not present

## 2017-03-09 DIAGNOSIS — G8929 Other chronic pain: Secondary | ICD-10-CM | POA: Diagnosis not present

## 2017-03-09 DIAGNOSIS — M25562 Pain in left knee: Secondary | ICD-10-CM | POA: Diagnosis not present

## 2017-03-09 DIAGNOSIS — M791 Myalgia: Secondary | ICD-10-CM

## 2017-03-09 DIAGNOSIS — M25552 Pain in left hip: Secondary | ICD-10-CM

## 2017-03-09 DIAGNOSIS — E781 Pure hyperglyceridemia: Secondary | ICD-10-CM | POA: Diagnosis not present

## 2017-03-09 DIAGNOSIS — M25561 Pain in right knee: Secondary | ICD-10-CM

## 2017-03-09 MED ORDER — OMEGA-3-ACID ETHYL ESTERS 1 G PO CAPS: 2.0000 g | ORAL_CAPSULE | Freq: Two times a day (BID) | ORAL | 3 refills | Status: DC

## 2017-03-09 MED ORDER — FUROSEMIDE 20 MG PO TABS: ORAL_TABLET | ORAL | 1 refills | Status: DC

## 2017-03-09 MED ORDER — ICOSAPENT ETHYL 1 G PO CAPS: 2.0000 g | ORAL_CAPSULE | Freq: Two times a day (BID) | ORAL | 3 refills | Status: DC

## 2017-03-09 MED ORDER — ATENOLOL 25 MG PO TABS: ORAL_TABLET | ORAL | 1 refills | Status: DC

## 2017-03-09 MED ORDER — POTASSIUM CHLORIDE CRYS ER 20 MEQ PO TBCR: EXTENDED_RELEASE_TABLET | ORAL | 3 refills | Status: DC

## 2017-03-09 MED ORDER — DULOXETINE HCL 60 MG PO CPEP: ORAL_CAPSULE | ORAL | 3 refills | Status: DC

## 2017-03-09 NOTE — Telephone Encounter (Signed)
Pharmacy called and stated that the vascepa was $400. Wanted to know if we could switch patient to Lovaza 1g 2 cap BID #360- it is cheaper. Please advise

## 2017-03-09 NOTE — Telephone Encounter (Signed)
Patient aware.

## 2017-03-09 NOTE — Telephone Encounter (Signed)
Absolutely can change. The formulary in EMR indicates the opposite coverage.   Laroy Apple, MD [facility] 03/09/2017, 12:14 PM

## 2017-03-09 NOTE — Progress Notes (Signed)
   HPI  Patient presents today here for follow-up chronic medical conditions and to discuss knee and hip pain.  Hypertension Good medication compliance, no chest pain. No headaches, dyspnea Leg swelling unchanged.  Knee pain Would like to go physical therapy, states that she has lateral left knee pain in medial left knee pain with locking and popping symptoms. 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 Patient states she also has left hip pain, she has started swinging her left leg out in order to walk normally.  Hyperlipidemia Intolerability to many medications She will try vascepa  PMH: Smoking status noted ROS: Per HPI  Objective: BP 119/82   Pulse 74   Temp 97.1 F (36.2 C) (Oral)   Ht 5' 2.5" (1.588 m)   Wt 203 lb (92.1 kg)   BMI 36.54 kg/m  Gen: NAD, alert, cooperative with exam HEENT: NCAT CV: RRR, good S1/S2, no murmur Resp: CTABL, no wheezes, non-labored Ext: No edema, warm Neuro: Alert and oriented, No gross deficits  MSK: L knee without erythema, effusion, bruising, or gross deformity  + medial jjoint line tenderness.  ligamentously intact to Lachman's and with varus and valgus stress.  Negative McMurray's test   Assessment and plan:  # Hypertension Well-controlled No changes to atenolol plus Lasix  # Knee pain, bilateral, chronic Left greater than right knee pain, likely meniscal injury which was discussed. Recommended orthopedic referral, she is not interested at this time. Physical therapy,  # Hip pain Bilateral hip pain, left greater than right Patient does have left groin pain with internal rotation and adduction of the left hip Physical therapy, offered x-ray, she declines  # Hypertriglyceridemia Starting vascepa  Chronic musculoskeletal pain Patient using gabapentin as needed for nighttime pain, it is helping.    Orders Placed This Encounter  Procedures  . Ambulatory referral to Physical Therapy    Referral  Priority:   Routine    Referral Type:   Physical Medicine    Referral Reason:   Specialty Services Required    Requested Specialty:   Physical Therapy    Number of Visits Requested:   1    Meds ordered this encounter  Medications  . aspirin 325 MG tablet    Sig: Take 325 mg by mouth daily.  Marland Kitchen atenolol (TENORMIN) 25 MG tablet    Sig: TAKE ONE (1) TABLET EACH DAY    Dispense:  90 tablet    Refill:  1  . DULoxetine (CYMBALTA) 60 MG capsule    Sig: TAKE ONE (1) CAPSULE EACH DAY    Dispense:  90 capsule    Refill:  3  . furosemide (LASIX) 20 MG tablet    Sig: TAKE ONE (1) TABLET EACH DAY    Dispense:  90 tablet    Refill:  1  . potassium chloride SA (K-DUR,KLOR-CON) 20 MEQ tablet    Sig: TAKE ONE (1) TABLET EACH DAY    Dispense:  90 tablet    Refill:  3  . Icosapent Ethyl (VASCEPA) 1 g CAPS    Sig: Take 2 g by mouth 2 (two) times daily.    Dispense:  360 capsule    Refill:  Coral Gables, MD [place] 03/09/2017, 9:16 AM

## 2017-03-16 ENCOUNTER — Ambulatory Visit: Payer: Medicare Other | Attending: Family Medicine | Admitting: Physical Therapy

## 2017-03-16 DIAGNOSIS — M25562 Pain in left knee: Secondary | ICD-10-CM | POA: Insufficient documentation

## 2017-03-16 DIAGNOSIS — M25652 Stiffness of left hip, not elsewhere classified: Secondary | ICD-10-CM | POA: Diagnosis not present

## 2017-03-16 DIAGNOSIS — M25662 Stiffness of left knee, not elsewhere classified: Secondary | ICD-10-CM | POA: Diagnosis not present

## 2017-03-16 DIAGNOSIS — G8929 Other chronic pain: Secondary | ICD-10-CM | POA: Insufficient documentation

## 2017-03-16 DIAGNOSIS — M25661 Stiffness of right knee, not elsewhere classified: Secondary | ICD-10-CM | POA: Insufficient documentation

## 2017-03-16 DIAGNOSIS — M25561 Pain in right knee: Secondary | ICD-10-CM | POA: Insufficient documentation

## 2017-03-16 NOTE — Therapy (Signed)
[facility] [place], [place], [address] Phone: [phone]   Fax:  [phone]  Physical Therapy Evaluation  Patient Details  Name: Kristie Lewis MRN: [id number] Date of Birth: Aug 19, 1951 Referring Provider: Kenn File MD  Encounter Date: 03/16/2017      PT End of Session - 03/16/17 1130    Visit Number 1   Number of Visits 16   Date for PT Re-Evaluation 05/15/17   PT Start Time 0900   PT Stop Time 0952   PT Time Calculation (min) 52 min   Activity Tolerance Patient tolerated treatment well   Behavior During Therapy [facility] for tasks assessed/performed      Past Medical History:  Diagnosis Date  . Arthritis   . Cataract   . GERD (gastroesophageal reflux disease)   . Gout   . Hyperlipidemia   . Hypertension   . Hypokalemia   . Microscopic colitis   . Renal cancer ([place]) 2010    Past Surgical History:  Procedure Laterality Date  . BREAST SURGERY     right breast bx  . CESAREAN SECTION     3 c-sections  . COLONOSCOPY    . HERNIA REPAIR     umbilical   . right nephrectomy    . TUBAL LIGATION    . UPPER GASTROINTESTINAL ENDOSCOPY      There were no vitals filed for this visit.       Subjective Assessment - 03/16/17 1006    Subjective The patient presents to OPPT with c/o bilateral knee pain. Her right knee has bothered her for 8-10 years and the left for 3-4 years.  She has had injuries to both knees.  her resting pain-level is 4/10 today but can increase to high levels with walking.  She performs chair Yoga and states that it does not increase her pain.  Rest decreases her pain.   Pertinent History Bilateral knee injuries and left hip pain.   Limitations Walking   How long can you walk comfortably? Short community distances.   Patient Stated Goals Get out of pain an avoid surgery.   Currently in Pain? Yes   Pain Score 4    Pain Location Knee   Pain Orientation Right;Left   Pain Descriptors /  Indicators Aching   Pain Type Chronic pain   Pain Onset More than a month ago   Pain Frequency Constant   Aggravating Factors  See above.   Pain Relieving Factors See above.            [facility] PT Assessment - 03/16/17 0001      Assessment   Medical Diagnosis Chronic bilateral knee pain.   Referring Provider Kenn File MD   Onset Date/Surgical Date --  Many years.     Precautions   Precautions --  PAIN-FREE BIL QUAD EXERCISES.     Restrictions   Weight Bearing Restrictions No     Balance Screen   Has the patient fallen in the past 6 months No   Has the patient had a decrease in activity level because of a fear of falling?  No   Is the patient reluctant to leave their home because of a fear of falling?  No     Home Environment   Living Environment Private residence     Prior Function   Level of Independence Independent     Observation/Other Assessments   Observations While seated right patella sits laterally and left superiorly laterally.  Posture/Postural Control   Posture Comments See "observation" section.     ROM / Strength   AROM / PROM / Strength AROM;Strength     AROM   Overall AROM Comments Right active knee extension to -15 degrees with flexion to 90 degree.  Left knee extension= -5 degrees with flexion to 70 degrees and left hip flexion limited to 90 degrees.     Strength   Overall Strength Comments Bilateral hip abduction= 4/5.  Less right quadriceps activiation compared to left.       Palpation   Patella mobility Moderate decrease in patellar mobility on right in all directions.   Palpation comment Patient c/o pain "in" knees.     Ambulation/Gait   Gait Pattern Antalgic            Objective measurements completed on examination: See above findings.          [facility] Adult PT Treatment/Exercise - 03/16/17 0001      Modalities   Modalities Electrical Stimulation;Moist Heat     Moist Heat Therapy   Number Minutes Moist Heat 20  Minutes   Moist Heat Location --  Right knee.     Acupuncturist Location --  Right knee.   Electrical Stimulation Action IFC   Electrical Stimulation Parameters 80-150 Hz x 20 minutes.   Electrical Stimulation Goals Pain                  PT Short Term Goals - 03/16/17 1140      PT SHORT TERM GOAL #1   Title Independent with an initial HEP.   Time 3   Period Weeks   Status New     PT SHORT TERM GOAL #2   Title Restore full active bilateral knee extension.   Time 3   Period Weeks   Status New           PT Long Term Goals - 03/16/17 1140      PT LONG TERM GOAL #1   Title Independent with an advanced HEP.   Time 8   Period Weeks   Status New     PT LONG TERM GOAL #2   Title Active bilateral knee flexion to 115 degrees+ so the patient can perform functional tasks and do so with pain not > 2-3/10.   Time 8   Period Weeks   Status New     PT LONG TERM GOAL #3   Title Bilateral hip strength to a solid 5/5 to increase stability for functional tasks.   Time 8   Period Weeks   Status New     PT LONG TERM GOAL #4   Title Walk a community distance with pain not > 3/10.   Time 8   Period Weeks   Status New     PT LONG TERM GOAL #5   Title increase left hip flexion to 110 degrees.   Time 8   Period Weeks   Status New                Plan - 03/16/17 1131    Clinical Impression Statement The patient presents with bilateral knee and left hip pain.  She has a significant loss of range of motion that limits her ability to walk for prolonged periods of time.  Her pain has been worseneing over many years and she states her right knee is worst than her right.  She hopes to avoid surgery.  Deficits are limited  her functional mobility.  She also has a loss of right patellar mobility.  Patient will benefit from skilled physical therapy to addres deficits.   History and Personal Factors relevant to plan of care: Long h/o  bilateral knee pain.   Clinical Presentation Evolving   Clinical Presentation due to: Worseneing multiple joint pain.   Clinical Decision Making Moderate   Rehab Potential Good   PT Frequency 2x / week   PT Duration 8 weeks   PT Treatment/Interventions ADLs/Self Care Home Management;Cryotherapy;Electrical Stimulation;Moist Heat;Ultrasound;Gait training;Stair training;Functional mobility training;Patient/family education;Therapeutic exercise;Neuromuscular re-education;Therapeutic activities;Manual techniques;Passive range of motion;Vasopneumatic Device;Dry needling   PT Next Visit Plan Right patellar mobility; Nustep for ROM; left SKTC; PROM bilateral knees.  Hip abduction and "clamshell" exercise.  PAIN-FREE bilateral quad strengthening.  Progress to stationary bike.  IFC.      Patient will benefit from skilled therapeutic intervention in order to improve the following deficits and impairments:  Pain, Decreased activity tolerance, Hypomobility, Decreased strength, Decreased range of motion, Abnormal gait  Visit Diagnosis: Chronic pain of right knee - Plan: PT plan of care cert/re-cert  Chronic pain of left knee - Plan: PT plan of care cert/re-cert  Stiffness of right knee, not elsewhere classified - Plan: PT plan of care cert/re-cert  Stiffness of left knee, not elsewhere classified - Plan: PT plan of care cert/re-cert  Stiffness of left hip, not elsewhere classified - Plan: PT plan of care cert/re-cert      G-Codes - 50/03/70 1013    Functional Assessment Tool Used (Outpatient Only) FOTO....67% limitation.   Functional Limitation Mobility: Walking and moving around   Mobility: Walking and Moving Around Current Status [phone]) At least 60 percent but less than 80 percent impaired, limited or restricted   Mobility: Walking and Moving Around Goal Status [phone]) At least 20 percent but less than 40 percent impaired, limited or restricted       Problem List Patient Active Problem List    Diagnosis Date Noted  . Biliary sludge 04/29/2016  . Healthcare maintenance 04/28/2016  . Estrogen deficiency 03/05/2016  . Neuralgia, post-herpetic 10/21/2015  . Chronic musculoskeletal pain 10/21/2015  . Hypertension 01/23/2013  . Hyperlipidemia 01/23/2013  . GERD (gastroesophageal reflux disease) 01/23/2013  . Hypokalemia 01/23/2013    Carlos Quackenbush, [place] MPT 03/16/2017, 12:09 PM  [facility] [address] [place], [place], [address] Phone: [phone]   Fax:  [phone]  Name: Kristie Lewis MRN: [id number] Date of Birth: 1950/12/22

## 2017-03-23 ENCOUNTER — Ambulatory Visit: Payer: Medicare Other | Admitting: Physical Therapy

## 2017-03-23 ENCOUNTER — Encounter: Payer: Self-pay | Admitting: Physical Therapy

## 2017-03-23 DIAGNOSIS — M25562 Pain in left knee: Secondary | ICD-10-CM

## 2017-03-23 DIAGNOSIS — M25561 Pain in right knee: Principal | ICD-10-CM

## 2017-03-23 DIAGNOSIS — M25662 Stiffness of left knee, not elsewhere classified: Secondary | ICD-10-CM

## 2017-03-23 DIAGNOSIS — G8929 Other chronic pain: Secondary | ICD-10-CM

## 2017-03-23 DIAGNOSIS — M25661 Stiffness of right knee, not elsewhere classified: Secondary | ICD-10-CM

## 2017-03-23 DIAGNOSIS — M25652 Stiffness of left hip, not elsewhere classified: Secondary | ICD-10-CM

## 2017-03-23 NOTE — Therapy (Signed)
[facility] [place], [place], [address] Phone: [phone]   Fax:  [phone]  Physical Therapy Treatment  Patient Details  Name: Kristie Lewis MRN: [id number] Date of Birth: Mar 12, 1951 Referring Provider: Kenn File MD  Encounter Date: 03/23/2017      PT End of Session - 03/23/17 0916    Visit Number 2   Number of Visits 16   Date for PT Re-Evaluation 05/15/17   PT Start Time 0900   PT Stop Time 0952   PT Time Calculation (min) 52 min   Activity Tolerance Patient tolerated treatment well   Behavior During Therapy [facility] for tasks assessed/performed      Past Medical History:  Diagnosis Date  . Arthritis   . Cataract   . GERD (gastroesophageal reflux disease)   . Gout   . Hyperlipidemia   . Hypertension   . Hypokalemia   . Microscopic colitis   . Renal cancer ([place]) 2010    Past Surgical History:  Procedure Laterality Date  . BREAST SURGERY     right breast bx  . CESAREAN SECTION     3 c-sections  . COLONOSCOPY    . HERNIA REPAIR     umbilical   . right nephrectomy    . TUBAL LIGATION    . UPPER GASTROINTESTINAL ENDOSCOPY      There were no vitals filed for this visit.      Subjective Assessment - 03/23/17 0908    Subjective Pt arriving to therapy today reporting 7/10 pain in left knee and 4/10 pain in right knee.    Pertinent History Bilateral knee injuries and left hip pain.   How long can you sit comfortably? Pt reporting getting up after sitting is hard   Currently in Pain? Yes   Pain Score 7    Pain Location Knee   Pain Orientation Left   Pain Descriptors / Indicators Aching   Pain Type Chronic pain   Pain Onset More than a month ago   Pain Frequency Constant   Aggravating Factors  bending, walking, stair climbing   Pain Relieving Factors sitting   Multiple Pain Sites Yes   Pain Score 4   Pain Location Knee   Pain Orientation Right   Pain Descriptors / Indicators Aching   Pain Type Chronic pain   Pain Onset More than a month ago   Pain Frequency Constant   Aggravating Factors  straighting   Pain Relieving Factors resting                         [facility] Adult PT Treatment/Exercise - 03/23/17 0001      Ambulation/Gait   Gait Pattern Antalgic     Exercises   Exercises Knee/Hip     Knee/Hip Exercises: Aerobic   Nustep L2 x 10 minutes     Knee/Hip Exercises: Supine   Quad Sets Strengthening;Both;10 reps   Short Arc Quad Sets AROM;Strengthening;10 reps   Bridges Both;10 reps;Limitations   Bridges Limitations holding 3 seconds each   Straight Leg Raises Strengthening;5 reps     Modalities   Modalities Electrical Stimulation;Moist Heat     Moist Heat Therapy   Number Minutes Moist Heat 15 Minutes   Moist Heat Location Knee  bilateral     Electrical Stimulation   Electrical Stimulation Location bilateral knees   Electrical Stimulation Action IFC   Electrical Stimulation Parameters 80-150 Hz x 15 minutes, intensity to pt's tolerance  Electrical Stimulation Goals Pain     Manual Therapy   Manual Therapy Joint mobilization;Soft tissue mobilization   Joint Mobilization patella mobilization bilateral knees   Soft tissue mobilization IT band, posterior hamstring tendons, medial and lateral knee musculature                PT Education - 03/23/17 0914    Education provided Yes   Education Details Discussed using pt's recembent bike at home for 10-15 minutes with no/low resistance.    Person(s) Educated Patient   Methods Explanation   Comprehension Verbalized understanding          PT Short Term Goals - 03/16/17 1140      PT SHORT TERM GOAL #1   Title Independent with an initial HEP.   Time 3   Period Weeks   Status New     PT SHORT TERM GOAL #2   Title Restore full active bilateral knee extension.   Time 3   Period Weeks   Status New           PT Long Term Goals - 03/23/17 1221      PT LONG TERM GOAL  #1   Title Independent with an advanced HEP.   Time 8   Period Weeks   Status On-going     PT LONG TERM GOAL #2   Title Active bilateral knee flexion to 115 degrees+ so the patient can perform functional tasks and do so with pain not > 2-3/10.   Time 8   Period Weeks   Status New     PT LONG TERM GOAL #3   Title Bilateral hip strength to a solid 5/5 to increase stability for functional tasks.   Time 8   Period Weeks   Status New     PT LONG TERM GOAL #4   Title Walk a community distance with pain not > 3/10.   Time 8   Period Weeks   Status New     PT LONG TERM GOAL #5   Title increase left hip flexion to 110 degrees.   Time 8   Period Weeks   Status New               Plan - 03/23/17 1217    Clinical Impression Statement Patient arriving to therapy reporting bilateral knee pain with difficulty walking. Pt tolerated exercises well. STW performed over posterior hamstring tendons and IT band as well as medial and lateral knee. Pt ended treatment with E-stim and ice. Pt reporting no pain at end of session.    Rehab Potential Good   PT Frequency 2x / week   PT Duration 8 weeks   PT Treatment/Interventions ADLs/Self Care Home Management;Cryotherapy;Electrical Stimulation;Moist Heat;Ultrasound;Gait training;Stair training;Functional mobility training;Patient/family education;Therapeutic exercise;Neuromuscular re-education;Therapeutic activities;Manual techniques;Passive range of motion;Vasopneumatic Device;Dry needling   PT Next Visit Plan Right patellar mobility; Nustep for ROM; left SKTC; PROM bilateral knees.  Hip abduction and "clamshell" exercise.  PAIN-FREE bilateral quad strengthening.  Progress to stationary bike.  IFC, IT band STW.   Consulted and Agree with Plan of Care Patient      Patient will benefit from skilled therapeutic intervention in order to improve the following deficits and impairments:  Pain, Decreased activity tolerance, Hypomobility, Decreased  strength, Decreased range of motion, Abnormal gait  Visit Diagnosis: Chronic pain of right knee  Chronic pain of left knee  Stiffness of right knee, not elsewhere classified  Stiffness of left knee, not elsewhere classified  Stiffness of  left hip, not elsewhere classified     Problem List Patient Active Problem List   Diagnosis Date Noted  . Biliary sludge 04/29/2016  . Healthcare maintenance 04/28/2016  . Estrogen deficiency 03/05/2016  . Neuralgia, post-herpetic 10/21/2015  . Chronic musculoskeletal pain 10/21/2015  . Hypertension 01/23/2013  . Hyperlipidemia 01/23/2013  . GERD (gastroesophageal reflux disease) 01/23/2013  . Hypokalemia 01/23/2013    Oretha Caprice, MPT 03/23/2017, 12:26 PM  [facility] [place], [place], [address] Phone: [phone]   Fax:  [phone]  Name: Kristie Lewis MRN: [id number] Date of Birth: 1951-07-18

## 2017-03-25 ENCOUNTER — Ambulatory Visit: Payer: Medicare Other | Admitting: Physical Therapy

## 2017-03-25 ENCOUNTER — Encounter: Payer: Self-pay | Admitting: Physical Therapy

## 2017-03-25 ENCOUNTER — Other Ambulatory Visit: Payer: Self-pay | Admitting: Family Medicine

## 2017-03-25 DIAGNOSIS — M25652 Stiffness of left hip, not elsewhere classified: Secondary | ICD-10-CM | POA: Diagnosis not present

## 2017-03-25 DIAGNOSIS — G8929 Other chronic pain: Secondary | ICD-10-CM | POA: Diagnosis not present

## 2017-03-25 DIAGNOSIS — M25662 Stiffness of left knee, not elsewhere classified: Secondary | ICD-10-CM | POA: Diagnosis not present

## 2017-03-25 DIAGNOSIS — M25562 Pain in left knee: Secondary | ICD-10-CM

## 2017-03-25 DIAGNOSIS — M25561 Pain in right knee: Principal | ICD-10-CM

## 2017-03-25 DIAGNOSIS — M25661 Stiffness of right knee, not elsewhere classified: Secondary | ICD-10-CM | POA: Diagnosis not present

## 2017-03-25 NOTE — Patient Instructions (Addendum)
  Knee Extension (Sitting)   Place __0-3__ pound weight on left/right ankle and straighten knee fully, lower slowly. Repeat _10___ times per set. Do __2-3__ sets per session. Do __2-3__ sessions per day.  Strengthening: Hip Abduction (Side-Lying)  Strengthening: Straight Leg Raise (Phase 1)  Repeat _10___ times per set. Do __2__ sets per session. Do __2__ sessions per day.  Pelvic Tilt: Posterior - Legs Bent (Supine)   Bridging   Slowly raise buttocks from floor, keeping stomach tight. Repeat _10___ times per set. Do __2__ sets per session. Do __2__ sessions per day.   Straight Leg Raise   Tighten stomach and slowly raise locked right leg __4__ inches from floor. Repeat __10-30__ times per set. Do __2__ sets per session. Do __2__ sessions per day.  stretch 30 sec x5-10 2-3 x daily    Strengthening: Terminal Knee Extension (Supine)    With right knee over bolster, straighten knee's by tightening muscles on top of thigh. Keep bottom of knee on bolster. Repeat __10__ times per set. Do _2___ sets per session. Do __2__ sessions per day.   Strengthening: Hip Abductor - Resisted    With band looped around both legs above knees, push thighs apart. Repeat __10__ times per set. Do __2-3__ sets per session. Do _2___ sessions per day.

## 2017-03-25 NOTE — Therapy (Signed)
[facility] [place], [place], [address] Phone: [phone]   Fax:  [phone]  Physical Therapy Treatment  Patient Details  Name: Kristie Lewis MRN: [id number] Date of Birth: 07-12-1951 Referring Provider: Kenn File MD  Encounter Date: 03/25/2017      PT End of Session - 03/25/17 0948    Visit Number 3   Number of Visits 16   Date for PT Re-Evaluation 05/15/17   PT Start Time 0900   PT Stop Time 0958   PT Time Calculation (min) 58 min   Activity Tolerance Patient tolerated treatment well   Behavior During Therapy [facility] for tasks assessed/performed      Past Medical History:  Diagnosis Date  . Arthritis   . Cataract   . GERD (gastroesophageal reflux disease)   . Gout   . Hyperlipidemia   . Hypertension   . Hypokalemia   . Microscopic colitis   . Renal cancer ([place]) 2010    Past Surgical History:  Procedure Laterality Date  . BREAST SURGERY     right breast bx  . CESAREAN SECTION     3 c-sections  . COLONOSCOPY    . HERNIA REPAIR     umbilical   . right nephrectomy    . TUBAL LIGATION    . UPPER GASTROINTESTINAL ENDOSCOPY      There were no vitals filed for this visit.      Subjective Assessment - 03/25/17 0901    Subjective Patient reported no pain after last treatment, had a slight pain yesterday evening in left knee medial side and felt it may "give way"   Pertinent History Bilateral knee injuries and left hip pain.   Limitations Walking   How long can you sit comfortably? Pt reporting getting up after sitting is hard   How long can you walk comfortably? Short community distances.   Patient Stated Goals Get out of pain an avoid surgery.   Currently in Pain? Yes   Pain Score --  right 4/10, left 6/10   Pain Location Knee   Pain Orientation Left;Right   Pain Descriptors / Indicators Aching   Pain Onset More than a month ago   Pain Frequency Constant   Aggravating Factors  increased  activity   Pain Relieving Factors rest                         [facility] Adult PT Treatment/Exercise - 03/25/17 0001      Knee/Hip Exercises: Aerobic   Nustep L4 x77mn     Knee/Hip Exercises: Seated   Long Arc Quad Strengthening;Both;3 sets;10 reps     Knee/Hip Exercises: Supine   Short Arc Quad Sets Strengthening;AROM;Both;3 sets;10 reps   Bridges Strengthening;Both;10 reps   Straight Leg Raises Strengthening;Both;1 set;10 reps   Other Supine Knee/Hip Exercises clam with red t-band x30     Knee/Hip Exercises: Sidelying   Hip ABduction Strengthening;Both;20 reps   Clams bil x20 each     Moist Heat Therapy   Number Minutes Moist Heat 15 Minutes   Moist Heat Location Knee  bil     Electrical Stimulation   Electrical Stimulation Location bilateral knees   Electrical Stimulation Action IFC   Electrical Stimulation Parameters 80-'150hz'  x150m   Electrical Stimulation Goals Pain     Manual Therapy   Manual Therapy Soft tissue mobilization;Passive ROM   Joint Mobilization patella mobilization bilateral knees   Passive ROM gentle STW and PROM to  bil knee, gentle flex/ext stretching with demo for self ROM                PT Education - 03/25/17 0915    Education provided Yes   Education Details HEP   Person(s) Educated Patient   Methods Explanation;Demonstration;Handout   Comprehension Verbalized understanding;Returned demonstration          PT Short Term Goals - 03/25/17 0923      PT SHORT TERM GOAL #1   Title Independent with an initial HEP.   Time 3   Period Weeks   Status Achieved     PT SHORT TERM GOAL #2   Title Restore full active bilateral knee extension.   Time 3   Period Weeks   Status On-going           PT Long Term Goals - 03/25/17 6301      PT LONG TERM GOAL #1   Title Independent with an advanced HEP.   Time 8   Period Weeks   Status On-going     PT LONG TERM GOAL #2   Title Active bilateral knee flexion to 115  degrees+ so the patient can perform functional tasks and do so with pain not > 2-3/10.   Time 8   Period Weeks   Status On-going     PT LONG TERM GOAL #3   Title Bilateral hip strength to a solid 5/5 to increase stability for functional tasks.   Time 8   Period Weeks   Status On-going     PT LONG TERM GOAL #4   Title Walk a community distance with pain not > 3/10.   Time 8   Period Weeks   Status On-going     PT LONG TERM GOAL #5   Title increase left hip flexion to 110 degrees.   Time 8   Period Weeks   Status On-going               Plan - 03/25/17 0949    Clinical Impression Statement Patient tolerated treatment well today. Patient has some ongoing pain in bil knees. Patient has reponded well thus far and was issued HEP for pain free strengthening. Instucted patient on self ROM and gentle pain free strengthening. Patient will be out of town and will return to therapy in a week or two. Patient met STG #1 and other golas ongoing due to pain, strength and ROM deficts.    Rehab Potential Good   PT Frequency 2x / week   PT Duration 8 weeks   PT Treatment/Interventions ADLs/Self Care Home Management;Cryotherapy;Electrical Stimulation;Moist Heat;Ultrasound;Gait training;Stair training;Functional mobility training;Patient/family education;Therapeutic exercise;Neuromuscular re-education;Therapeutic activities;Manual techniques;Passive range of motion;Vasopneumatic Device;Dry needling   PT Next Visit Plan Right patellar mobility; Nustep for ROM; left SKTC; PROM bilateral knees.  Hip abduction and "clamshell" exercise.  PAIN-FREE bilateral quad strengthening.  Progress to stationary bike.  IFC, IT band STW.   Consulted and Agree with Plan of Care Patient      Patient will benefit from skilled therapeutic intervention in order to improve the following deficits and impairments:  Pain, Decreased activity tolerance, Hypomobility, Decreased strength, Decreased range of motion, Abnormal  gait  Visit Diagnosis: Chronic pain of right knee  Chronic pain of left knee  Stiffness of right knee, not elsewhere classified  Stiffness of left hip, not elsewhere classified  Stiffness of left knee, not elsewhere classified     Problem List Patient Active Problem List   Diagnosis Date Noted  .  Biliary sludge 04/29/2016  . Healthcare maintenance 04/28/2016  . Estrogen deficiency 03/05/2016  . Neuralgia, post-herpetic 10/21/2015  . Chronic musculoskeletal pain 10/21/2015  . Hypertension 01/23/2013  . Hyperlipidemia 01/23/2013  . GERD (gastroesophageal reflux disease) 01/23/2013  . Hypokalemia 01/23/2013    Noor Vidales P, PTA 03/25/2017, 10:03 AM  [facility] [place], [place], [address] Phone: [phone]   Fax:  [phone]  Name: Kristie Lewis MRN: [id number] Date of Birth: Nov 21, 1950

## 2017-04-13 ENCOUNTER — Ambulatory Visit: Payer: Medicare Other | Admitting: Physical Therapy

## 2017-04-13 DIAGNOSIS — M25562 Pain in left knee: Secondary | ICD-10-CM | POA: Diagnosis not present

## 2017-04-13 DIAGNOSIS — M25561 Pain in right knee: Secondary | ICD-10-CM | POA: Diagnosis not present

## 2017-04-13 DIAGNOSIS — M25661 Stiffness of right knee, not elsewhere classified: Secondary | ICD-10-CM

## 2017-04-13 DIAGNOSIS — G8929 Other chronic pain: Secondary | ICD-10-CM

## 2017-04-13 DIAGNOSIS — M25662 Stiffness of left knee, not elsewhere classified: Secondary | ICD-10-CM

## 2017-04-13 DIAGNOSIS — M25652 Stiffness of left hip, not elsewhere classified: Secondary | ICD-10-CM

## 2017-04-13 NOTE — Therapy (Signed)
[facility] [place], [place], [address] Phone: [phone]   Fax:  [phone]  Physical Therapy Treatment  Patient Details  Name: KESTREL MIS MRN: [id number] Date of Birth: [birth year] Referring Provider: Kenn File MD  Encounter Date: 04/13/2017      PT End of Session - 04/13/17 1432    Visit Number 4   Number of Visits 16   Date for PT Re-Evaluation 05/15/17   PT Start Time 1430   PT Stop Time 1529   PT Time Calculation (min) 59 min   Activity Tolerance Patient tolerated treatment well   Behavior During Therapy [facility] for tasks assessed/performed      Past Medical History:  Diagnosis Date  . Arthritis   . Cataract   . GERD (gastroesophageal reflux disease)   . Gout   . Hyperlipidemia   . Hypertension   . Hypokalemia   . Microscopic colitis   . Renal cancer ([place]) 2010    Past Surgical History:  Procedure Laterality Date  . BREAST SURGERY     right breast bx  . CESAREAN SECTION     3 c-sections  . COLONOSCOPY    . HERNIA REPAIR     umbilical   . right nephrectomy    . TUBAL LIGATION    . UPPER GASTROINTESTINAL ENDOSCOPY      There were no vitals filed for this visit.      Subjective Assessment - 04/13/17 1433    Subjective Patient reports pain in B knees.   Pertinent History Bilateral knee injuries and left hip pain.   Limitations Walking   How long can you sit comfortably? Pt reporting getting up after sitting is hard   How long can you walk comfortably? Short community distances.   Patient Stated Goals Get out of pain and avoid surgery.   Currently in Pain? Yes   Pain Score 8    Pain Location Knee   Pain Orientation Right;Left   Pain Descriptors / Indicators Aching   Pain Type Chronic pain   Pain Onset More than a month ago   Pain Frequency Constant   Aggravating Factors  increased activity   Pain Relieving Factors rest            [facility] PT Assessment - 04/13/17 0001       Strength   Overall Strength Comments R hip flex 4+/5, L 5/5; knee ext R 5/5, L 5-/5                     [facility] Adult PT Treatment/Exercise - 04/13/17 0001      Self-Care   Self-Care Other Self-Care Comments   Other Self-Care Comments  educated pt on use of rolling pin to quads and ITB in sitting as well as MFR with tennis ball     Knee/Hip Exercises: Stretches   Active Hamstring Stretch Left;1 rep;30 seconds   Other Knee/Hip Stretches ITB stretch with strap x 30 sec; butterfly stretch in supine     Knee/Hip Exercises: Aerobic   Nustep L5x10     Modalities   Modalities Electrical Stimulation     Electrical Stimulation   Electrical Stimulation Location B knee   Electrical Stimulation Action premod   Electrical Stimulation Parameters 80-150 Hz x 15 min   Electrical Stimulation Goals Pain     Manual Therapy   Manual Therapy Joint mobilization;Soft tissue mobilization;Myofascial release   Joint Mobilization patella mobilization bilateral knees   Soft tissue mobilization  to B quadriceps   Myofascial Release to B ITB          Trigger Point Dry Needling - 04/13/17 1527    Consent Given? Yes   Education Handout Provided Yes   Muscles Treated Lower Body Quadriceps  B   Quadriceps Response Twitch response elicited;Palpable increased muscle length  Bil              PT Education - 04/13/17 1524    Education provided Yes   Education Details DN education and aftercare; HEP; self care - mfr with rolling pin   Person(s) Educated Patient   Methods Explanation;Demonstration;Tactile cues;Verbal cues;Handout   Comprehension Verbalized understanding;Returned demonstration          PT Short Term Goals - 04/13/17 1534      PT SHORT TERM GOAL #1   Title Independent with an initial HEP.   Time 3   Period Weeks   Status Achieved     PT SHORT TERM GOAL #2   Title Restore full active bilateral knee extension.   Time 3   Period Weeks   Status On-going            PT Long Term Goals - 04/13/17 1533      PT LONG TERM GOAL #1   Title Independent with an advanced HEP.   Time 8   Period Weeks   Status On-going     PT LONG TERM GOAL #3   Title Bilateral hip strength to a solid 5/5 to increase stability for functional tasks.   Time 8   Period Weeks   Status On-going     PT LONG TERM GOAL #4   Title Walk a community distance with pain not > 3/10.   Time 8   Period Weeks   Status On-going               Plan - 04/13/17 1527    Clinical Impression Statement Patient presented today with 8/10 pain in B knees after 2 week break from PT. Patient continues to have marked limitations in ROM and pain limiting ADLS. She has significant TPs throughout her quads, hip adductors, and HS which are likely contributing to her knee pain. Patient consented to Dry Needling after being educated. She responded well to initial treatment of DN and will benefit from more.    PT Frequency 2x / week   PT Duration 8 weeks   PT Treatment/Interventions ADLs/Self Care Home Management;Cryotherapy;Electrical Stimulation;Moist Heat;Ultrasound;Gait training;Stair training;Functional mobility training;Patient/family education;Therapeutic exercise;Neuromuscular re-education;Therapeutic activities;Manual techniques;Passive range of motion;Vasopneumatic Device;Dry needling   PT Next Visit Plan Focus on STW to B hip adductors, quads, ITB and HS, then ROM. Right patellar mobility; Nustep for ROM; left SKTC; PROM bilateral knees.  PAIN-FREE bilateral quad strengthening.  Progress to stationary bike.  IFC, IT band STW.   PT Home Exercise Plan ITB/HS and ADD stretch with strap; butterfly stretch      Patient will benefit from skilled therapeutic intervention in order to improve the following deficits and impairments:  Pain, Decreased activity tolerance, Hypomobility, Decreased strength, Decreased range of motion, Abnormal gait  Visit Diagnosis: Chronic pain of right  knee  Chronic pain of left knee  Stiffness of right knee, not elsewhere classified  Stiffness of left hip, not elsewhere classified  Stiffness of left knee, not elsewhere classified     Problem List Patient Active Problem List   Diagnosis Date Noted  . Biliary sludge 04/29/2016  . Healthcare maintenance 04/28/2016  . Estrogen  deficiency 03/05/2016  . Neuralgia, post-herpetic 10/21/2015  . Chronic musculoskeletal pain 10/21/2015  . Hypertension 01/23/2013  . Hyperlipidemia 01/23/2013  . GERD (gastroesophageal reflux disease) 01/23/2013  . Hypokalemia 01/23/2013    Madelyn Flavors PT 04/13/2017, 3:41 PM  [facility] [address] [place], [place], [address] Phone: [phone]   Fax:  [phone]  Name: NGA RABON MRN: [id number] Date of Birth: [birth year]

## 2017-04-13 NOTE — Patient Instructions (Addendum)
Trigger Point Dry Needling  . What is Trigger Point Dry Needling (DN)? o DN is a physical therapy technique used to treat muscle pain and dysfunction. Specifically, DN helps deactivate muscle trigger points (muscle knots).  o A thin filiform needle is used to penetrate the skin and stimulate the underlying trigger point. The goal is for a local twitch response (LTR) to occur and for the trigger point to relax. No medication of any kind is injected during the procedure.   . What Does Trigger Point Dry Needling Feel Like?  o The procedure feels different for each individual patient. Some patients report that they do not actually feel the needle enter the skin and overall the process is not painful. Very mild bleeding may occur. However, many patients feel a deep cramping in the muscle in which the needle was inserted. This is the local twitch response.   Marland Kitchen How Will I feel after the treatment? o Soreness is normal, and the onset of soreness may not occur for a few hours. Typically this soreness does not last longer than two days.  o Bruising is uncommon, however; ice can be used to decrease any possible bruising.  o In rare cases feeling tired or nauseous after the treatment is normal. In addition, your symptoms may get worse before they get better, this period will typically not last longer than 24 hours.   . What Can I do After My Treatment? o Increase your hydration by drinking more water for the next 24 hours. o You may place ice or heat on the areas treated that have become sore, however, do not use heat on inflamed or bruised areas. Heat often brings more relief post needling. o You can continue your regular activities, but vigorous activity is not recommended initially after the treatment for 24 hours. o DN is best combined with other physical therapy such as strengthening, stretching, and other therapies.     Outer Hip Stretch: Reclined IT Band Stretch (Strap)   Strap around opposite  foot, pull across only as far as possible with shoulders on mat. Hold for __60__ seconds. Repeat __2__ times each leg. 2-3 x/day. You may also pull the leg straight up to stretch the Hamstrings. Copyright  VHI. All rights reserved.   Adductor Stretch, Reclined (Strap, Wall)    Warm up with leg vertical. Rotate leg to side and fix foot to wall. Anchor opposite hip. Hold for _60 seconds. Repeat __2__ times each leg.  Copyright  VHI. All rights reserved.   Butterfly, Supine    Lie on back, feet together. Lower knees toward floor. Hold _60__ seconds. Repeat _2__ times per session. Do _2__ sessions per day.  Copyright  VHI. All rights reserved.    Kristie Lewis, PT 04/13/17 3:21 PM; [facility] [place], [place], [address] Phone: [phone]   Fax:  [phone]

## 2017-04-15 ENCOUNTER — Ambulatory Visit: Payer: Medicare Other | Attending: Family Medicine | Admitting: *Deleted

## 2017-04-15 DIAGNOSIS — M25662 Stiffness of left knee, not elsewhere classified: Secondary | ICD-10-CM | POA: Diagnosis not present

## 2017-04-15 DIAGNOSIS — M25562 Pain in left knee: Secondary | ICD-10-CM | POA: Insufficient documentation

## 2017-04-15 DIAGNOSIS — M25661 Stiffness of right knee, not elsewhere classified: Secondary | ICD-10-CM | POA: Insufficient documentation

## 2017-04-15 DIAGNOSIS — M25652 Stiffness of left hip, not elsewhere classified: Secondary | ICD-10-CM | POA: Insufficient documentation

## 2017-04-15 DIAGNOSIS — G8929 Other chronic pain: Secondary | ICD-10-CM

## 2017-04-15 DIAGNOSIS — M25561 Pain in right knee: Secondary | ICD-10-CM | POA: Diagnosis not present

## 2017-04-15 NOTE — Therapy (Signed)
[facility] [place], [place], [address] Phone: [phone]   Fax:  [phone]  Physical Therapy Treatment  Patient Details  Name: Kristie Lewis MRN: [id number] Date of Birth: 1951-05-09 Referring Provider: Kenn File MD  Encounter Date: 04/15/2017      PT End of Session - 04/15/17 1302    Visit Number 5   Number of Visits 16   Date for PT Re-Evaluation 05/15/17   PT Start Time 1300   PT Stop Time 1400   PT Time Calculation (min) 60 min      Past Medical History:  Diagnosis Date  . Arthritis   . Cataract   . GERD (gastroesophageal reflux disease)   . Gout   . Hyperlipidemia   . Hypertension   . Hypokalemia   . Microscopic colitis   . Renal cancer ([place]) 2010    Past Surgical History:  Procedure Laterality Date  . BREAST SURGERY     right breast bx  . CESAREAN SECTION     3 c-sections  . COLONOSCOPY    . HERNIA REPAIR     umbilical   . right nephrectomy    . TUBAL LIGATION    . UPPER GASTROINTESTINAL ENDOSCOPY      There were no vitals filed for this visit.      Subjective Assessment - 04/15/17 1305    Pertinent History Bilateral knee injuries and left hip pain.   Limitations Walking   How long can you sit comfortably? Pt reporting getting up after sitting is hard   How long can you walk comfortably? Short community distances.   Patient Stated Goals Get out of pain and avoid surgery.                         [facility] Adult PT Treatment/Exercise - 04/15/17 0001      Knee/Hip Exercises: Aerobic   Nustep L5x84min   seat 9     Knee/Hip Exercises: Standing   Hip Abduction AROM;Both;1 set;10 reps   Rocker Board 3 minutes  calf stretching     Knee/Hip Exercises: Seated   Long Arc Quad Strengthening;Both;3 sets;10 reps  pause at top     Modalities   Engineer, maintenance (IT) Location B knee Premod 80-150hz  x 15  mins   Electrical Stimulation Goals Pain     Manual Therapy   Manual Therapy Joint mobilization;Soft tissue mobilization;Myofascial release   Joint Mobilization patella mobilization bilateral knees   Soft tissue mobilization to B quadriceps IASTM                  PT Short Term Goals - 04/13/17 1534      PT SHORT TERM GOAL #1   Title Independent with an initial HEP.   Time 3   Period Weeks   Status Achieved     PT SHORT TERM GOAL #2   Title Restore full active bilateral knee extension.   Time 3   Period Weeks   Status On-going           PT Long Term Goals - 04/13/17 1533      PT LONG TERM GOAL #1   Title Independent with an advanced HEP.   Time 8   Period Weeks   Status On-going     PT LONG TERM GOAL #3   Title Bilateral hip strength to a solid 5/5 to increase stability  for functional tasks.   Time 8   Period Weeks   Status On-going     PT LONG TERM GOAL #4   Title Walk a community distance with pain not > 3/10.   Time 8   Period Weeks   Status On-going               Plan - 04/15/17 1303    Clinical Impression Statement Pt arrived today doing a little better after last Rx, but not sure if she want's to try DN again. Rx focused on light quad strengthening and STW. Pt felt better with decreased pain in both knees after Rx.   Clinical Decision Making Moderate   Rehab Potential Good   PT Frequency 2x / week   PT Duration 8 weeks   PT Treatment/Interventions ADLs/Self Care Home Management;Cryotherapy;Electrical Stimulation;Moist Heat;Ultrasound;Gait training;Stair training;Functional mobility training;Patient/family education;Therapeutic exercise;Neuromuscular re-education;Therapeutic activities;Manual techniques;Passive range of motion;Vasopneumatic Device;Dry needling   PT Next Visit Plan Focus on STW to B hip adductors, quads, ITB and HS, then ROM. Right patellar mobility; Nustep for ROM; left SKTC; PROM bilateral knees.  PAIN-FREE bilateral  quad strengthening.  Progress to stationary bike.  IFC, IT band STW.   PT Home Exercise Plan ITB/HS and ADD stretch with strap; butterfly stretch   Consulted and Agree with Plan of Care Patient      Patient will benefit from skilled therapeutic intervention in order to improve the following deficits and impairments:  Pain, Decreased activity tolerance, Hypomobility, Decreased strength, Decreased range of motion, Abnormal gait  Visit Diagnosis: Chronic pain of right knee  Chronic pain of left knee  Stiffness of right knee, not elsewhere classified  Stiffness of left hip, not elsewhere classified  Stiffness of left knee, not elsewhere classified     Problem List Patient Active Problem List   Diagnosis Date Noted  . Biliary sludge 04/29/2016  . Healthcare maintenance 04/28/2016  . Estrogen deficiency 03/05/2016  . Neuralgia, post-herpetic 10/21/2015  . Chronic musculoskeletal pain 10/21/2015  . Hypertension 01/23/2013  . Hyperlipidemia 01/23/2013  . GERD (gastroesophageal reflux disease) 01/23/2013  . Hypokalemia 01/23/2013    Monish Haliburton,[name], PTA 04/15/2017, 2:34 PM  [facility] [address] [place], [place], [address] Phone: [phone]   Fax:  [phone]  Name: Kristie Lewis MRN: [id number] Date of Birth: Feb 05, 1951

## 2017-04-20 ENCOUNTER — Ambulatory Visit: Payer: Medicare Other

## 2017-04-20 DIAGNOSIS — M25561 Pain in right knee: Principal | ICD-10-CM

## 2017-04-20 DIAGNOSIS — M25562 Pain in left knee: Secondary | ICD-10-CM

## 2017-04-20 DIAGNOSIS — M25662 Stiffness of left knee, not elsewhere classified: Secondary | ICD-10-CM | POA: Diagnosis not present

## 2017-04-20 DIAGNOSIS — G8929 Other chronic pain: Secondary | ICD-10-CM

## 2017-04-20 DIAGNOSIS — M25661 Stiffness of right knee, not elsewhere classified: Secondary | ICD-10-CM

## 2017-04-20 DIAGNOSIS — M25652 Stiffness of left hip, not elsewhere classified: Secondary | ICD-10-CM | POA: Diagnosis not present

## 2017-04-20 NOTE — Therapy (Signed)
[facility] [place], [place], [address] Phone: [phone]   Fax:  [phone]  Physical Therapy Treatment  Patient Details  Name: Kristie Lewis MRN: [id number] Date of Birth: [birth year] Referring Provider: Kenn File MD  Encounter Date: 04/20/2017      PT End of Session - 04/20/17 1022    Visit Number 6   Number of Visits 16   Date for PT Re-Evaluation 05/15/17   PT Start Time 0945   PT Stop Time 6314   PT Time Calculation (min) 55 min      Past Medical History:  Diagnosis Date  . Arthritis   . Cataract   . GERD (gastroesophageal reflux disease)   . Gout   . Hyperlipidemia   . Hypertension   . Hypokalemia   . Microscopic colitis   . Renal cancer ([place]) 2010    Past Surgical History:  Procedure Laterality Date  . BREAST SURGERY     right breast bx  . CESAREAN SECTION     3 c-sections  . COLONOSCOPY    . HERNIA REPAIR     umbilical   . right nephrectomy    . TUBAL LIGATION    . UPPER GASTROINTESTINAL ENDOSCOPY      There were no vitals filed for this visit.      Subjective Assessment - 04/20/17 0956    Subjective Pt. noting she performed all HEP upon waking.     Patient Stated Goals Get out of pain and avoid surgery.   Currently in Pain? Yes   Pain Score 3    Pain Location Knee   Pain Orientation Right;Left   Pain Descriptors / Indicators Aching   Pain Type Chronic pain   Pain Onset More than a month ago   Pain Frequency Constant  with movement    Aggravating Factors  Increased activity, prolonged sitting   Pain Relieving Factors rest, heat, ice   Multiple Pain Sites No                         [facility] Adult PT Treatment/Exercise - 04/20/17 1002      Knee/Hip Exercises: Stretches   Passive Hamstring Stretch Right;Left;2 reps;30 seconds   Other Knee/Hip Stretches B ITB stretch 2 x 30 sec    Other Knee/Hip Stretches B piriformis KTOS stretch x 30 sec      Knee/Hip  Exercises: Aerobic   Nustep L5 x 12 min   seat 9     Knee/Hip Exercises: Standing   Terminal Knee Extension Limitations B TKE with wall ball squeeze 5" x 10    Other Standing Knee Exercises Alternating forward/lateral toe-clears to 8" step #3 at ankles x 15 reps; avoiding circumduction     Knee/Hip Exercises: Seated   Long Arc Quad Strengthening;Both;10 reps;2 sets   [employer] Weight 3 lbs.   Long Arc Quad Limitations Adduction Theatre stage manager IFC    Electrical Stimulation Parameters 80-150Hz , intensity to pt. tolerance, 15'    Electrical Stimulation Goals Pain                  PT Short Term Goals - 04/13/17 1534      PT SHORT TERM GOAL #1   Title Independent with an initial HEP.   Time 3   Period Weeks   Status Achieved  PT SHORT TERM GOAL #2   Title Restore full active bilateral knee extension.   Time 3   Period Weeks   Status On-going           PT Long Term Goals - 04/13/17 1533      PT LONG TERM GOAL #1   Title Independent with an advanced HEP.   Time 8   Period Weeks   Status On-going     PT LONG TERM GOAL #3   Title Bilateral hip strength to a solid 5/5 to increase stability for functional tasks.   Time 8   Period Weeks   Status On-going     PT LONG TERM GOAL #4   Title Walk a community distance with pain not > 3/10.   Time 8   Period Weeks   Status On-going       Clinical Impression:   Pt. doing well today doing HEP consistently per report.  Tolerated all quad/LE strengthening activity well today.  Patellar mobs and LE stretching continued today with pt. still demonstrating very limited patellar mobility.  Some L knee discomfort to end treatment thus E-stim to knee per pt. request.     Plan:  Focus on STW to B hip adductors, quads, ITB and HS, then ROM. Right patellar mobility; Nustep for ROM; left SKTC; PROM bilateral knees.  PAIN-FREE  bilateral quad strengthening.  Progress to stationary bike.  IFC, IT band STW.     Patient will benefit from skilled therapeutic intervention in order to improve the following deficits and impairments:  Pain, Decreased activity tolerance, Hypomobility, Decreased strength, Decreased range of motion, Abnormal gait  Visit Diagnosis: Chronic pain of right knee  Chronic pain of left knee  Stiffness of right knee, not elsewhere classified  Stiffness of left hip, not elsewhere classified  Stiffness of left knee, not elsewhere classified       Problem List Patient Active Problem List   Diagnosis Date Noted  . Biliary sludge 04/29/2016  . Healthcare maintenance 04/28/2016  . Estrogen deficiency 03/05/2016  . Neuralgia, post-herpetic 10/21/2015  . Chronic musculoskeletal pain 10/21/2015  . Hypertension 01/23/2013  . Hyperlipidemia 01/23/2013  . GERD (gastroesophageal reflux disease) 01/23/2013  . Hypokalemia 01/23/2013    Bess Harvest, PTA 04/20/17 9:31 PM  [facility] [address] [place], [place], [address] Phone: [phone]   Fax:  [phone]  Name: MADDEN PIAZZA MRN: [id number] Date of Birth: 06/12/1951

## 2017-04-22 ENCOUNTER — Ambulatory Visit: Payer: Medicare Other

## 2017-04-22 DIAGNOSIS — M25562 Pain in left knee: Secondary | ICD-10-CM | POA: Diagnosis not present

## 2017-04-22 DIAGNOSIS — M25561 Pain in right knee: Secondary | ICD-10-CM | POA: Diagnosis not present

## 2017-04-22 DIAGNOSIS — M25652 Stiffness of left hip, not elsewhere classified: Secondary | ICD-10-CM

## 2017-04-22 DIAGNOSIS — G8929 Other chronic pain: Secondary | ICD-10-CM | POA: Diagnosis not present

## 2017-04-22 DIAGNOSIS — M25662 Stiffness of left knee, not elsewhere classified: Secondary | ICD-10-CM | POA: Diagnosis not present

## 2017-04-22 DIAGNOSIS — M25661 Stiffness of right knee, not elsewhere classified: Secondary | ICD-10-CM

## 2017-04-22 NOTE — Therapy (Signed)
[facility] [place], [place], [address] Phone: [phone]   Fax:  [phone]  Physical Therapy Treatment  Patient Details  Name: Kristie Lewis MRN: [id number] Date of Birth: 19-Apr-1951 Referring Provider: Kenn File MD  Encounter Date: 04/22/2017      PT End of Session - 04/22/17 1012    Visit Number 7   Number of Visits 16   Date for PT Re-Evaluation 05/15/17   PT Start Time 0945   PT Stop Time 3893   PT Time Calculation (min) 60 min      Past Medical History:  Diagnosis Date  . Arthritis   . Cataract   . GERD (gastroesophageal reflux disease)   . Gout   . Hyperlipidemia   . Hypertension   . Hypokalemia   . Microscopic colitis   . Renal cancer ([place]) 2010    Past Surgical History:  Procedure Laterality Date  . BREAST SURGERY     right breast bx  . CESAREAN SECTION     3 c-sections  . COLONOSCOPY    . HERNIA REPAIR     umbilical   . right nephrectomy    . TUBAL LIGATION    . UPPER GASTROINTESTINAL ENDOSCOPY      There were no vitals filed for this visit.      Subjective Assessment - 04/22/17 0949    Subjective Some mild muscular soreness following last treatment.     Patient Stated Goals Get out of pain and avoid surgery.   Currently in Pain? Yes   Pain Score 3    Pain Location Knee   Pain Orientation Right;Left   Pain Descriptors / Indicators Aching   Pain Type Chronic pain   Pain Onset More than a month ago   Pain Relieving Factors Rest    Multiple Pain Sites No                         [facility] Adult PT Treatment/Exercise - 04/22/17 1013      Knee/Hip Exercises: Stretches   Passive Hamstring Stretch Right;Left;2 reps;30 seconds   Other Knee/Hip Stretches B ITB stretch 2 x 30 sec    Other Knee/Hip Stretches B piriformis KTOS stretch x 30 sec      Knee/Hip Exercises: Aerobic   Nustep L5 x 12 min   seat 9     Knee/Hip Exercises: Seated   Long Arc Quad  Strengthening;Both;10 reps;2 sets   [employer] Weight 4 lbs.   Long Arc Quad Limitations Adduction ball squeeze    Hamstring Curl Right;Left;15 reps;Strengthening   Hamstring Limitations with green TB      Acupuncturist Location L knee    Electrical Stimulation Action IFC    Electrical Stimulation Parameters 80-150Hz , intensity to pt. tolerance, 15'    Electrical Stimulation Goals Pain     Manual Therapy   Manual Therapy Joint mobilization;Soft tissue mobilization;Myofascial release   Joint Mobilization patella mobilization bilateral knees   Soft tissue mobilization B STM to ITB/ vastus lateralis    Myofascial Release to B ITB/vastus lateralis                  PT Short Term Goals - 04/13/17 1534      PT SHORT TERM GOAL #1   Title Independent with an initial HEP.   Time 3   Period Weeks   Status Achieved     PT SHORT TERM  GOAL #2   Title Restore full active bilateral knee extension.   Time 3   Period Weeks   Status On-going           PT Long Term Goals - 04/13/17 1533      PT LONG TERM GOAL #1   Title Independent with an advanced HEP.   Time 8   Period Weeks   Status On-going     PT LONG TERM GOAL #3   Title Bilateral hip strength to a solid 5/5 to increase stability for functional tasks.   Time 8   Period Weeks   Status On-going     PT LONG TERM GOAL #4   Title Walk a community distance with pain not > 3/10.   Time 8   Period Weeks   Status On-going               Plan - 04/22/17 1237    Clinical Impression Statement Pt. doing well today noting mild muscular soreness following last visit, which went away.  Treatment focusing on manual STM/TPR to B lateral hip/thigh musculature to improved tissue quality and improved patellar mobility.  Pt. still with marked TP's in lateral quads and ITB.  Tolerated all strengthening therex well.  Pt. requesting E-stim. to L knee due to mild soreness to end treatment.      PT Treatment/Interventions ADLs/Self Care Home Management;Cryotherapy;Electrical Stimulation;Moist Heat;Ultrasound;Gait training;Stair training;Functional mobility training;Patient/family education;Therapeutic exercise;Neuromuscular re-education;Therapeutic activities;Manual techniques;Passive range of motion;Vasopneumatic Device;Dry needling   PT Next Visit Plan Focus on STW to B hip adductors, quads, ITB and HS, then ROM. Right patellar mobility; Nustep for ROM; left SKTC; PROM bilateral knees.  PAIN-FREE bilateral quad strengthening.  Progress to stationary bike.  IFC, IT band STW.      Patient will benefit from skilled therapeutic intervention in order to improve the following deficits and impairments:  Pain, Decreased activity tolerance, Hypomobility, Decreased strength, Decreased range of motion, Abnormal gait  Visit Diagnosis: Chronic pain of right knee  Chronic pain of left knee  Stiffness of right knee, not elsewhere classified  Stiffness of left hip, not elsewhere classified  Stiffness of left knee, not elsewhere classified     Problem List Patient Active Problem List   Diagnosis Date Noted  . Biliary sludge 04/29/2016  . Healthcare maintenance 04/28/2016  . Estrogen deficiency 03/05/2016  . Neuralgia, post-herpetic 10/21/2015  . Chronic musculoskeletal pain 10/21/2015  . Hypertension 01/23/2013  . Hyperlipidemia 01/23/2013  . GERD (gastroesophageal reflux disease) 01/23/2013  . Hypokalemia 01/23/2013    Bess Harvest, PTA 04/22/17 12:46 PM  [facility] [place], [place], [address] Phone: [phone]   Fax:  [phone]  Name: Kristie Lewis MRN: [id number] Date of Birth: 01-23-1951

## 2017-04-27 ENCOUNTER — Ambulatory Visit: Payer: Medicare Other | Admitting: Physical Therapy

## 2017-04-27 ENCOUNTER — Encounter: Payer: Self-pay | Admitting: Physical Therapy

## 2017-04-27 DIAGNOSIS — M25562 Pain in left knee: Secondary | ICD-10-CM

## 2017-04-27 DIAGNOSIS — M25561 Pain in right knee: Principal | ICD-10-CM

## 2017-04-27 DIAGNOSIS — M25662 Stiffness of left knee, not elsewhere classified: Secondary | ICD-10-CM

## 2017-04-27 DIAGNOSIS — M25652 Stiffness of left hip, not elsewhere classified: Secondary | ICD-10-CM | POA: Diagnosis not present

## 2017-04-27 DIAGNOSIS — M25661 Stiffness of right knee, not elsewhere classified: Secondary | ICD-10-CM | POA: Diagnosis not present

## 2017-04-27 DIAGNOSIS — G8929 Other chronic pain: Secondary | ICD-10-CM | POA: Diagnosis not present

## 2017-04-27 NOTE — Therapy (Signed)
[facility] [place], [place], [address] Phone: [phone]   Fax:  [phone]  Physical Therapy Treatment  Patient Details  Name: Kristie Lewis MRN: [id number] Date of Birth: [birth year] Referring Provider: Kenn File MD  Encounter Date: 04/27/2017      PT End of Session - 04/27/17 1303    Visit Number 8   Number of Visits 16   Date for PT Re-Evaluation 05/15/17   PT Start Time 0900   PT Stop Time 0950   PT Time Calculation (min) 50 min   Activity Tolerance Patient tolerated treatment well   Behavior During Therapy [facility] for tasks assessed/performed      Past Medical History:  Diagnosis Date  . Arthritis   . Cataract   . GERD (gastroesophageal reflux disease)   . Gout   . Hyperlipidemia   . Hypertension   . Hypokalemia   . Microscopic colitis   . Renal cancer ([place]) 2010    Past Surgical History:  Procedure Laterality Date  . BREAST SURGERY     right breast bx  . CESAREAN SECTION     3 c-sections  . COLONOSCOPY    . HERNIA REPAIR     umbilical   . right nephrectomy    . TUBAL LIGATION    . UPPER GASTROINTESTINAL ENDOSCOPY      There were no vitals filed for this visit.      Subjective Assessment - 04/27/17 0954    Subjective I didn't get to do my exercises this morning. I was so stiff this morning, I could barely walk. Pt reporting 6/10 pain.    Pertinent History Bilateral knee injuries and left hip pain.   Limitations Walking   How long can you sit comfortably? Pt reporting getting up after sitting is hard   How long can you walk comfortably? Short community distances.   Patient Stated Goals Get out of pain and avoid surgery.   Currently in Pain? Yes   Pain Score 6    Pain Location Knee   Pain Orientation Left;Right   Pain Descriptors / Indicators Aching;Tightness   Pain Type Chronic pain   Pain Onset More than a month ago   Pain Frequency Constant   Aggravating Factors  bending,  increased activity, prolonged sitting, first thing in the morning   Pain Relieving Factors rest                         [facility] Adult PT Treatment/Exercise - 04/27/17 0001      Knee/Hip Exercises: Aerobic   Recumbent Bike Attempted rocking motion for 10 minutes progressing toward full rotation. pt unable to make full rotation today due to stiffness and pain.    Nustep L6 x 11 min   seat 9     Modalities   Modalities Electrical Stimulation;Moist Heat     Moist Heat Therapy   Number Minutes Moist Heat 15 Minutes   Moist Heat Location Knee  bilateral     Electrical Stimulation   Electrical Stimulation Location L knee    Electrical Stimulation Action IFC   Electrical Stimulation Parameters 80-150 Hz, x 15 minutes, intensity to pt';s tolerance   Electrical Stimulation Goals Pain     Manual Therapy   Manual Therapy Joint mobilization;Soft tissue mobilization;Myofascial release   Manual therapy comments Pt with tenderness noted over tibial tuberosity and distal ITBand   Joint Mobilization patella mobilization bilateral knees   Soft tissue mobilization  B STM to ITB/ vastus lateralis    Myofascial Release to B ITB/vastus lateralis   Passive ROM gentle knee flexion                PT Education - 04/27/17 1005    Education Details Reviewed HEP   Person(s) Educated Patient   Methods Explanation   Comprehension Verbalized understanding;Returned demonstration          PT Short Term Goals - 04/13/17 1534      PT SHORT TERM GOAL #1   Title Independent with an initial HEP.   Time 3   Period Weeks   Status Achieved     PT SHORT TERM GOAL #2   Title Restore full active bilateral knee extension.   Time 3   Period Weeks   Status On-going           PT Long Term Goals - 04/27/17 1314      PT LONG TERM GOAL #1   Title Independent with an advanced HEP.   Status Achieved     PT LONG TERM GOAL #2   Title Active bilateral knee flexion to 115 degrees+ so  the patient can perform functional tasks and do so with pain not > 2-3/10.   Status On-going     PT LONG TERM GOAL #3   Title Bilateral hip strength to a solid 5/5 to increase stability for functional tasks.   Time 8   Period Weeks   Status On-going     PT LONG TERM GOAL #4   Title Walk a community distance with pain not > 3/10.   Time 8   Period Weeks   Status On-going     PT LONG TERM GOAL #5   Title increase left hip flexion to 110 degrees.   Time 8   Period Weeks   Status On-going               Plan - 04/27/17 1312    Clinical Impression Statement Pt tolerating therapy well with complaints of tibial tuberosity, IT band and vastus lateralus tenderness. Pt began training on the recumbent bike today, but unable to complete a full revolution. Pt tolerated STW, E-stim, and heat and reported no pain at end of session. Continue skilled PT to progress toward LTG's set.    Rehab Potential Good   PT Frequency 2x / week   PT Duration 8 weeks   PT Treatment/Interventions ADLs/Self Care Home Management;Cryotherapy;Electrical Stimulation;Moist Heat;Ultrasound;Gait training;Stair training;Functional mobility training;Patient/family education;Therapeutic exercise;Neuromuscular re-education;Therapeutic activities;Manual techniques;Passive range of motion;Vasopneumatic Device;Dry needling   PT Next Visit Plan Focus on STW to B hip adductors, quads, ITB and HS, then ROM. Right patellar mobility; Nustep for ROM; left SKTC; PROM bilateral knees.  PAIN-FREE bilateral quad strengthening.  Progress to stationary bike.  IFC, IT band STW.   PT Home Exercise Plan ITB/HS and ADD stretch with strap; butterfly stretch   Consulted and Agree with Plan of Care Patient      Patient will benefit from skilled therapeutic intervention in order to improve the following deficits and impairments:  Pain, Decreased activity tolerance, Hypomobility, Decreased strength, Decreased range of motion, Abnormal  gait  Visit Diagnosis: Chronic pain of right knee  Chronic pain of left knee  Stiffness of right knee, not elsewhere classified  Stiffness of left hip, not elsewhere classified  Stiffness of left knee, not elsewhere classified     Problem List Patient Active Problem List   Diagnosis Date Noted  . Biliary sludge  04/29/2016  . Healthcare maintenance 04/28/2016  . Estrogen deficiency 03/05/2016  . Neuralgia, post-herpetic 10/21/2015  . Chronic musculoskeletal pain 10/21/2015  . Hypertension 01/23/2013  . Hyperlipidemia 01/23/2013  . GERD (gastroesophageal reflux disease) 01/23/2013  . Hypokalemia 01/23/2013    Oretha Caprice, MPT 04/27/2017, 1:17 PM  [facility] [place], [place], [address] Phone: [phone]   Fax:  [phone]  Name: JAICEY SWEANEY MRN: [id number] Date of Birth: [birth year]

## 2017-04-29 ENCOUNTER — Ambulatory Visit: Payer: Medicare Other | Admitting: *Deleted

## 2017-04-29 DIAGNOSIS — G8929 Other chronic pain: Secondary | ICD-10-CM

## 2017-04-29 DIAGNOSIS — M25561 Pain in right knee: Principal | ICD-10-CM

## 2017-04-29 DIAGNOSIS — M25562 Pain in left knee: Secondary | ICD-10-CM | POA: Diagnosis not present

## 2017-04-29 DIAGNOSIS — M25662 Stiffness of left knee, not elsewhere classified: Secondary | ICD-10-CM | POA: Diagnosis not present

## 2017-04-29 DIAGNOSIS — M25661 Stiffness of right knee, not elsewhere classified: Secondary | ICD-10-CM | POA: Diagnosis not present

## 2017-04-29 DIAGNOSIS — M25652 Stiffness of left hip, not elsewhere classified: Secondary | ICD-10-CM

## 2017-04-29 NOTE — Therapy (Signed)
[facility] [place], [place], [address] Phone: [phone]   Fax:  [phone]  Physical Therapy Treatment  Patient Details  Name: Kristie Lewis MRN: [id number] Date of Birth: [birth year] Referring Provider: Kenn File MD  Encounter Date: 04/29/2017      PT End of Session - 04/29/17 1037    Visit Number 9   Number of Visits 16   Date for PT Re-Evaluation 05/15/17   PT Start Time 0945   PT Stop Time 1036   PT Time Calculation (min) 51 min      Past Medical History:  Diagnosis Date  . Arthritis   . Cataract   . GERD (gastroesophageal reflux disease)   . Gout   . Hyperlipidemia   . Hypertension   . Hypokalemia   . Microscopic colitis   . Renal cancer ([place]) 2010    Past Surgical History:  Procedure Laterality Date  . BREAST SURGERY     right breast bx  . CESAREAN SECTION     3 c-sections  . COLONOSCOPY    . HERNIA REPAIR     umbilical   . right nephrectomy    . TUBAL LIGATION    . UPPER GASTROINTESTINAL ENDOSCOPY      There were no vitals filed for this visit.      Subjective Assessment - 04/29/17 0953    Subjective I didn't get to do my exercises this morning. I was so stiff this morning, I could barely walk. Pt reporting 6/10 pain.    Pertinent History Bilateral knee injuries and left hip pain.   Limitations Walking   How long can you sit comfortably? Pt reporting getting up after sitting is hard   How long can you walk comfortably? Short community distances.   Patient Stated Goals Get out of pain and avoid surgery.   Currently in Pain? Yes   Pain Score 6    Pain Location Knee   Pain Orientation Right;Left   Pain Descriptors / Indicators Aching;Tightness   Pain Type Chronic pain   Pain Onset More than a month ago   Pain Frequency Constant                         [facility] Adult PT Treatment/Exercise - 04/29/17 0001      Knee/Hip Exercises: Aerobic   Nustep L6 x 13 min    seat 9     Knee/Hip Exercises: Seated   Long Arc Quad Strengthening;Both;10 reps;3 sets   [employer] Weight 5 lbs.  4# LT  5# RT     Modalities   Modalities Electrical Stimulation;Moist Heat     Moist Heat Therapy   Number Minutes Moist Heat 15 Minutes   Moist Heat Location Knee     Electrical Stimulation   Electrical Stimulation Location L knee IFC x 15 mins 80-150hz    Electrical Stimulation Goals Pain     Manual Therapy   Manual therapy comments Pt with tenderness noted over tibial tuberosity and distal ITBand   Joint Mobilization patella mobilization bilateral knees   Soft tissue mobilization B STM to ITB/ vastus lateralis    Myofascial Release to B ITB/vastus lateralis  IASTM   Passive ROM gentle knee flexion  LT sorer than RT                  PT Short Term Goals - 04/13/17 1534      PT SHORT TERM GOAL #1  Title Independent with an initial HEP.   Time 3   Period Weeks   Status Achieved     PT SHORT TERM GOAL #2   Title Restore full active bilateral knee extension.   Time 3   Period Weeks   Status On-going           PT Long Term Goals - 04/27/17 1314      PT LONG TERM GOAL #1   Title Independent with an advanced HEP.   Status Achieved     PT LONG TERM GOAL #2   Title Active bilateral knee flexion to 115 degrees+ so the patient can perform functional tasks and do so with pain not > 2-3/10.   Status On-going     PT LONG TERM GOAL #3   Title Bilateral hip strength to a solid 5/5 to increase stability for functional tasks.   Time 8   Period Weeks   Status On-going     PT LONG TERM GOAL #4   Title Walk a community distance with pain not > 3/10.   Time 8   Period Weeks   Status On-going     PT LONG TERM GOAL #5   Title increase left hip flexion to 110 degrees.   Time 8   Period Weeks   Status On-going               Plan - 04/29/17 1038    Clinical Impression Statement Pt arrived today doing fairly well with Both knees  feeling stiff LT > RT. Rx focused on ROM and strengthening as well as STW for myofascial tightness Bil. ITB. Pt did well with Rx and continues to progress towards goals   Rehab Potential Good   PT Frequency 2x / week   PT Duration 8 weeks   PT Treatment/Interventions ADLs/Self Care Home Management;Cryotherapy;Electrical Stimulation;Moist Heat;Ultrasound;Gait training;Stair training;Functional mobility training;Patient/family education;Therapeutic exercise;Neuromuscular re-education;Therapeutic activities;Manual techniques;Passive range of motion;Vasopneumatic Device;Dry needling   PT Next Visit Plan Focus on STW to B hip adductors, quads, ITB and HS, then ROM. Right patellar mobility; Nustep for ROM; left SKTC; PROM bilateral knees.  PAIN-FREE bilateral quad strengthening.  Progress to stationary bike.  IFC, IT band STW.   PT Home Exercise Plan ITB/HS and ADD stretch with strap; butterfly stretch   Consulted and Agree with Plan of Care Patient      Patient will benefit from skilled therapeutic intervention in order to improve the following deficits and impairments:  Pain, Decreased activity tolerance, Hypomobility, Decreased strength, Decreased range of motion, Abnormal gait  Visit Diagnosis: Chronic pain of right knee  Chronic pain of left knee  Stiffness of right knee, not elsewhere classified  Stiffness of left hip, not elsewhere classified  Stiffness of left knee, not elsewhere classified     Problem List Patient Active Problem List   Diagnosis Date Noted  . Biliary sludge 04/29/2016  . Healthcare maintenance 04/28/2016  . Estrogen deficiency 03/05/2016  . Neuralgia, post-herpetic 10/21/2015  . Chronic musculoskeletal pain 10/21/2015  . Hypertension 01/23/2013  . Hyperlipidemia 01/23/2013  . GERD (gastroesophageal reflux disease) 01/23/2013  . Hypokalemia 01/23/2013    [name],[name], PTA 04/29/2017, 10:44 AM  [facility] [place], [place], [address] Phone: [phone]   Fax:  [phone]  Name: Kristie Lewis MRN: [id number] Date of Birth: 1951-03-03

## 2017-05-04 ENCOUNTER — Ambulatory Visit: Payer: Medicare Other | Admitting: Physical Therapy

## 2017-05-04 ENCOUNTER — Encounter: Payer: Self-pay | Admitting: Physical Therapy

## 2017-05-04 DIAGNOSIS — M25561 Pain in right knee: Secondary | ICD-10-CM | POA: Diagnosis not present

## 2017-05-04 DIAGNOSIS — M25562 Pain in left knee: Secondary | ICD-10-CM | POA: Diagnosis not present

## 2017-05-04 DIAGNOSIS — G8929 Other chronic pain: Secondary | ICD-10-CM

## 2017-05-04 DIAGNOSIS — M25662 Stiffness of left knee, not elsewhere classified: Secondary | ICD-10-CM | POA: Diagnosis not present

## 2017-05-04 DIAGNOSIS — M25661 Stiffness of right knee, not elsewhere classified: Secondary | ICD-10-CM | POA: Diagnosis not present

## 2017-05-04 DIAGNOSIS — M25652 Stiffness of left hip, not elsewhere classified: Secondary | ICD-10-CM

## 2017-05-04 NOTE — Therapy (Signed)
[facility] [place], [place], [address] Phone: [phone]   Fax:  [phone]  Physical Therapy Treatment  Patient Details  Name: Kristie Lewis MRN: [id number] Date of Birth: 11-19-1950 Referring Provider: Kenn File MD  Encounter Date: 05/04/2017      PT End of Session - 05/04/17 0855    Visit Number 10   Number of Visits 16   Date for PT Re-Evaluation 05/15/17   PT Start Time 0901   PT Stop Time 0949   PT Time Calculation (min) 48 min   Activity Tolerance Patient tolerated treatment well   Behavior During Therapy [facility] for tasks assessed/performed      Past Medical History:  Diagnosis Date  . Arthritis   . Cataract   . GERD (gastroesophageal reflux disease)   . Gout   . Hyperlipidemia   . Hypertension   . Hypokalemia   . Microscopic colitis   . Renal cancer ([place]) 2010    Past Surgical History:  Procedure Laterality Date  . BREAST SURGERY     right breast bx  . CESAREAN SECTION     3 c-sections  . COLONOSCOPY    . HERNIA REPAIR     umbilical   . right nephrectomy    . TUBAL LIGATION    . UPPER GASTROINTESTINAL ENDOSCOPY      There were no vitals filed for this visit.      Subjective Assessment - 05/04/17 0855    Subjective Reports a flare up friday and has had pain since. Thinks pain may be from weather but also she had a high step count on thursday. States that she also had a busy weekend.   Pertinent History Bilateral knee injuries and left hip pain.   Limitations Walking   How long can you sit comfortably? Pt reporting getting up after sitting is hard   How long can you walk comfortably? Short community distances.   Patient Stated Goals Get out of pain and avoid surgery.   Currently in Pain? Yes   Pain Score 6    Pain Location Knee   Pain Orientation Right;Left   Pain Descriptors / Indicators Sore;Tightness   Pain Type Chronic pain   Pain Onset More than a month ago             [facility] PT Assessment - 05/04/17 0001      Assessment   Medical Diagnosis Chronic bilateral knee pain.   Next MD Visit 08/2017     Restrictions   Weight Bearing Restrictions No                     [facility] Adult PT Treatment/Exercise - 05/04/17 0001      Knee/Hip Exercises: Aerobic   Nustep L5 x13 min     Knee/Hip Exercises: Seated   Long Arc Quad Strengthening;Both;2 sets;10 reps;Weights   Long Arc Quad Weight 3 lbs.     Knee/Hip Exercises: Supine   Straight Leg Raises AROM;Both;2 sets;10 reps     Modalities   Modalities Electrical Stimulation;Moist Heat     Moist Heat Therapy   Number Minutes Moist Heat 15 Minutes   Moist Heat Location Knee     Electrical Stimulation   Electrical Stimulation Location B knee   Electrical Stimulation Action Pre-Mod   Electrical Stimulation Parameters 80-150 hz x15 min   Electrical Stimulation Goals Pain     Manual Therapy   Manual Therapy Myofascial release   Myofascial Release IASTW  to L ITB to reduce tone and pain                  PT Short Term Goals - 04/13/17 1534      PT SHORT TERM GOAL #1   Title Independent with an initial HEP.   Time 3   Period Weeks   Status Achieved     PT SHORT TERM GOAL #2   Title Restore full active bilateral knee extension.   Time 3   Period Weeks   Status On-going           PT Long Term Goals - 04/27/17 1314      PT LONG TERM GOAL #1   Title Independent with an advanced HEP.   Status Achieved     PT LONG TERM GOAL #2   Title Active bilateral knee flexion to 115 degrees+ so the patient can perform functional tasks and do so with pain not > 2-3/10.   Status On-going     PT LONG TERM GOAL #3   Title Bilateral hip strength to a solid 5/5 to increase stability for functional tasks.   Time 8   Period Weeks   Status On-going     PT LONG TERM GOAL #4   Title Walk a community distance with pain not > 3/10.   Time 8   Period Weeks   Status On-going     PT  LONG TERM GOAL #5   Title increase left hip flexion to 110 degrees.   Time 8   Period Weeks   Status On-going               Plan - 06/03/2017 2119    Clinical Impression Statement Patient tolerated today's treatment fairly well as she has suffered increased pain in B knees since the weekend. Patient continues to have pain with getting into and out of car as well as household activities per patient report. Sharp L knee lateral and inferior pain noted by patient with Nustep. Fatigue reported by patient with SLR bilaterally. IASTW completed to L ITB to reduce tone and pain with moderate redness presenting during the treatment. Patient did report decreased soreness as IASTW progressed in L ITB. Normal modalities response noted following removal of the modalities. Patient experienced L ITB region feeling "better" following end of treatment with no pain reported.   Rehab Potential Good   PT Frequency 2x / week   PT Duration 8 weeks   PT Treatment/Interventions ADLs/Self Care Home Management;Cryotherapy;Electrical Stimulation;Moist Heat;Ultrasound;Gait training;Stair training;Functional mobility training;Patient/family education;Therapeutic exercise;Neuromuscular re-education;Therapeutic activities;Manual techniques;Passive range of motion;Vasopneumatic Device;Dry needling   PT Next Visit Plan Focus on STW to B hip adductors, quads, ITB and HS, then ROM. Right patellar mobility; Nustep for ROM; left SKTC; PROM bilateral knees.  PAIN-FREE bilateral quad strengthening.  Progress to stationary bike.  IFC, IT band STW.   PT Home Exercise Plan ITB/HS and ADD stretch with strap; butterfly stretch   Consulted and Agree with Plan of Care Patient      Patient will benefit from skilled therapeutic intervention in order to improve the following deficits and impairments:  Pain, Decreased activity tolerance, Hypomobility, Decreased strength, Decreased range of motion, Abnormal gait  Visit Diagnosis: Chronic  pain of right knee  Chronic pain of left knee  Stiffness of right knee, not elsewhere classified  Stiffness of left hip, not elsewhere classified  Stiffness of left knee, not elsewhere classified       G-Codes - 06-03-2017 1029  Functional Assessment Tool Used (Outpatient Only) FOTO.Marland KitchenMarland KitchenMarland Kitchen64% limitation....Marland Kitchen10th visit.   Functional Limitation Mobility: Walking and moving around   Mobility: Walking and Moving Around Current Status [phone]) At least 60 percent but less than 80 percent impaired, limited or restricted   Mobility: Walking and Moving Around Goal Status [phone]) At least 20 percent but less than 40 percent impaired, limited or restricted      Problem List Patient Active Problem List   Diagnosis Date Noted  . Biliary sludge 04/29/2016  . Healthcare maintenance 04/28/2016  . Estrogen deficiency 03/05/2016  . Neuralgia, post-herpetic 10/21/2015  . Chronic musculoskeletal pain 10/21/2015  . Hypertension 01/23/2013  . Hyperlipidemia 01/23/2013  . GERD (gastroesophageal reflux disease) 01/23/2013  . Hypokalemia 01/23/2013    Wynelle Fanny, PTA 05/04/2017, 10:35 AM  [facility] [address] [place], [place], [address] Phone: [phone]   Fax:  [phone]  Name: YAMIRA PAPA MRN: [id number] Date of Birth: April 11, 1951

## 2017-05-06 ENCOUNTER — Ambulatory Visit: Payer: Medicare Other | Admitting: Physical Therapy

## 2017-05-06 ENCOUNTER — Encounter: Payer: Self-pay | Admitting: Physical Therapy

## 2017-05-06 DIAGNOSIS — M25562 Pain in left knee: Secondary | ICD-10-CM

## 2017-05-06 DIAGNOSIS — G8929 Other chronic pain: Secondary | ICD-10-CM

## 2017-05-06 DIAGNOSIS — M25661 Stiffness of right knee, not elsewhere classified: Secondary | ICD-10-CM | POA: Diagnosis not present

## 2017-05-06 DIAGNOSIS — M25662 Stiffness of left knee, not elsewhere classified: Secondary | ICD-10-CM | POA: Diagnosis not present

## 2017-05-06 DIAGNOSIS — M25652 Stiffness of left hip, not elsewhere classified: Secondary | ICD-10-CM | POA: Diagnosis not present

## 2017-05-06 DIAGNOSIS — M25561 Pain in right knee: Secondary | ICD-10-CM | POA: Diagnosis not present

## 2017-05-06 NOTE — Therapy (Signed)
[facility] [place], [place], [address] Phone: [phone]   Fax:  [phone]  Physical Therapy Treatment  Patient Details  Name: Kristie Lewis MRN: [id number] Date of Birth: 1951-05-07 Referring Provider: Kenn File MD  Encounter Date: 05/06/2017      PT End of Session - 05/06/17 0904    Visit Number 11   Number of Visits 16   Date for PT Re-Evaluation 05/15/17   PT Start Time 0901   PT Stop Time 0949   PT Time Calculation (min) 48 min   Activity Tolerance Patient tolerated treatment well   Behavior During Therapy [facility] for tasks assessed/performed      Past Medical History:  Diagnosis Date  . Arthritis   . Cataract   . GERD (gastroesophageal reflux disease)   . Gout   . Hyperlipidemia   . Hypertension   . Hypokalemia   . Microscopic colitis   . Renal cancer ([place]) 2010    Past Surgical History:  Procedure Laterality Date  . BREAST SURGERY     right breast bx  . CESAREAN SECTION     3 c-sections  . COLONOSCOPY    . HERNIA REPAIR     umbilical   . right nephrectomy    . TUBAL LIGATION    . UPPER GASTROINTESTINAL ENDOSCOPY      There were no vitals filed for this visit.      Subjective Assessment - 05/06/17 0902    Subjective Reports that she had no pain upon leaving following previous treatment but still very sore with L ITB.    Pertinent History Bilateral knee injuries and left hip pain.   How long can you sit comfortably? Pt reporting getting up after sitting is hard   How long can you walk comfortably? Short community distances.   Patient Stated Goals Get out of pain and avoid surgery.   Currently in Pain? Yes   Pain Score 4    Pain Location Knee   Pain Orientation Left   Pain Descriptors / Indicators Sore   Pain Type Chronic pain   Pain Onset More than a month ago   Pain Score 3   Pain Location Knee   Pain Orientation Right   Pain Descriptors / Indicators Discomfort   Pain Type  Chronic pain   Pain Onset More than a month ago            [facility] PT Assessment - 05/06/17 0001      Assessment   Medical Diagnosis Chronic bilateral knee pain.   Next MD Visit 08/2017     Restrictions   Weight Bearing Restrictions No                     [facility] Adult PT Treatment/Exercise - 05/06/17 0001      Knee/Hip Exercises: Aerobic   Nustep L7 x15 min     Knee/Hip Exercises: Seated   Long Arc Quad Strengthening;Both;2 sets;10 reps;Weights   Long Arc Quad Weight 4 lbs.     Knee/Hip Exercises: Supine   Straight Leg Raises AROM;Both;2 sets;10 reps     Modalities   Modalities Paramedic B ITB   Electrical Stimulation Action Pre-Mod   Electrical Stimulation Parameters 80-150 hz x15 min   Electrical Stimulation Goals Pain;Tone     Manual Therapy   Manual Therapy Myofascial release   Myofascial Release IASTW to L ITB to  reduce tone and pain                  PT Short Term Goals - 04/13/17 1534      PT SHORT TERM GOAL #1   Title Independent with an initial HEP.   Time 3   Period Weeks   Status Achieved     PT SHORT TERM GOAL #2   Title Restore full active bilateral knee extension.   Time 3   Period Weeks   Status On-going           PT Long Term Goals - 05/06/17 0936      PT LONG TERM GOAL #1   Title Independent with an advanced HEP.   Status Achieved     PT LONG TERM GOAL #2   Title Active bilateral knee flexion to 115 degrees+ so the patient can perform functional tasks and do so with pain not > 2-3/10.   Status On-going     PT LONG TERM GOAL #3   Title Bilateral hip strength to a solid 5/5 to increase stability for functional tasks.   Time 8   Period Weeks   Status On-going     PT LONG TERM GOAL #4   Title Walk a community distance with pain not > 3/10.   Time 8   Period Weeks   Status Achieved     PT LONG TERM GOAL #5   Title increase left  hip flexion to 110 degrees.   Time 8   Period Weeks   Status On-going               Plan - 05/06/17 4163    Clinical Impression Statement Patient tolerated today's treatment well as she increased resistance on Nustep herself and was able to complete greater resistance on LAQ. Patient still experienced 6-7/10 L knee pain with L LAQ per patient report in superiolateral aspect of L knee. Fatigue still presents with SLR bilaterally but especially LLE. IASTW completed to L ITB with soreness still reported by patient in mid ITB and distal ITB. Patient feels as if lack of flexion comes from inferiolateral aspect of L knee. Patient encouraged to still continue ITB stretches at home bilaterally as R ITB tightness palpated as well. Normal modalities response noted following removal of the modalities. Patient reports LEs feeling "good" following end of today's treatment.   Rehab Potential Good   PT Frequency 2x / week   PT Duration 8 weeks   PT Treatment/Interventions ADLs/Self Care Home Management;Cryotherapy;Electrical Stimulation;Moist Heat;Ultrasound;Gait training;Stair training;Functional mobility training;Patient/family education;Therapeutic exercise;Neuromuscular re-education;Therapeutic activities;Manual techniques;Passive range of motion;Vasopneumatic Device;Dry needling   PT Next Visit Plan Focus on STW to B hip adductors, quads, ITB and HS, then ROM. Right patellar mobility; Nustep for ROM; left SKTC; PROM bilateral knees.  PAIN-FREE bilateral quad strengthening.  Progress to stationary bike.  IFC, IT band STW.   PT Home Exercise Plan ITB/HS and ADD stretch with strap; butterfly stretch   Consulted and Agree with Plan of Care Patient      Patient will benefit from skilled therapeutic intervention in order to improve the following deficits and impairments:  Pain, Decreased activity tolerance, Hypomobility, Decreased strength, Decreased range of motion, Abnormal gait  Visit  Diagnosis: Chronic pain of right knee  Chronic pain of left knee  Stiffness of right knee, not elsewhere classified  Stiffness of left hip, not elsewhere classified  Stiffness of left knee, not elsewhere classified     Problem List Patient Active  Problem List   Diagnosis Date Noted  . Biliary sludge 04/29/2016  . Healthcare maintenance 04/28/2016  . Estrogen deficiency 03/05/2016  . Neuralgia, post-herpetic 10/21/2015  . Chronic musculoskeletal pain 10/21/2015  . Hypertension 01/23/2013  . Hyperlipidemia 01/23/2013  . GERD (gastroesophageal reflux disease) 01/23/2013  . Hypokalemia 01/23/2013    Wynelle Fanny, PTA 05/06/2017, 9:51 AM  [facility] [address] [place], [place], [address] Phone: [phone]   Fax:  [phone]  Name: ELMA SHANDS MRN: [id number] Date of Birth: 01/31/1951

## 2017-05-11 ENCOUNTER — Ambulatory Visit: Payer: Medicare Other | Admitting: *Deleted

## 2017-05-11 DIAGNOSIS — G8929 Other chronic pain: Secondary | ICD-10-CM

## 2017-05-11 DIAGNOSIS — M25652 Stiffness of left hip, not elsewhere classified: Secondary | ICD-10-CM | POA: Diagnosis not present

## 2017-05-11 DIAGNOSIS — M25661 Stiffness of right knee, not elsewhere classified: Secondary | ICD-10-CM | POA: Diagnosis not present

## 2017-05-11 DIAGNOSIS — M25662 Stiffness of left knee, not elsewhere classified: Secondary | ICD-10-CM

## 2017-05-11 DIAGNOSIS — M25561 Pain in right knee: Principal | ICD-10-CM

## 2017-05-11 DIAGNOSIS — M25562 Pain in left knee: Secondary | ICD-10-CM

## 2017-05-11 NOTE — Therapy (Signed)
[facility] [place], [place], [address] Phone: [phone]   Fax:  [phone]  Physical Therapy Treatment  Patient Details  Name: Kristie Lewis MRN: [id number] Date of Birth: 1951-08-21 Referring Provider: Kenn File MD  Encounter Date: 05/11/2017      PT End of Session - 05/11/17 1008    Visit Number 12   Number of Visits 16   Date for PT Re-Evaluation 05/15/17   PT Start Time 0900   PT Stop Time 1000   PT Time Calculation (min) 60 min      Past Medical History:  Diagnosis Date  . Arthritis   . Cataract   . GERD (gastroesophageal reflux disease)   . Gout   . Hyperlipidemia   . Hypertension   . Hypokalemia   . Microscopic colitis   . Renal cancer ([place]) 2010    Past Surgical History:  Procedure Laterality Date  . BREAST SURGERY     right breast bx  . CESAREAN SECTION     3 c-sections  . COLONOSCOPY    . HERNIA REPAIR     umbilical   . right nephrectomy    . TUBAL LIGATION    . UPPER GASTROINTESTINAL ENDOSCOPY      There were no vitals filed for this visit.      Subjective Assessment - 05/11/17 0903    Subjective I have                          [facility] Adult PT Treatment/Exercise - 05/11/17 0001      Knee/Hip Exercises: Aerobic   Nustep L7 x15 min     Knee/Hip Exercises: Seated   Long Arc Quad Strengthening;Both;2 sets;10 reps;Weights   Long Arc Quad Weight 4 lbs.     Knee/Hip Exercises: Supine   Straight Leg Raises --     Modalities   Modalities Electrical Stimulation     Electrical Stimulation   Electrical Stimulation Location B ITB   Premod 80-150 hz x 15 mins   Electrical Stimulation Goals Pain;Tone     Manual Therapy   Manual Therapy Myofascial release   Myofascial Release IASTW to Bil  ITB to reduce tone and pain     MH x15 mins to both knees             PT Short Term Goals - 04/13/17 1534      PT SHORT TERM GOAL #1   Title Independent  with an initial HEP.   Time 3   Period Weeks   Status Achieved     PT SHORT TERM GOAL #2   Title Restore full active bilateral knee extension.   Time 3   Period Weeks   Status On-going           PT Long Term Goals - 05/06/17 0936      PT LONG TERM GOAL #1   Title Independent with an advanced HEP.   Status Achieved     PT LONG TERM GOAL #2   Title Active bilateral knee flexion to 115 degrees+ so the patient can perform functional tasks and do so with pain not > 2-3/10.   Status On-going     PT LONG TERM GOAL #3   Title Bilateral hip strength to a solid 5/5 to increase stability for functional tasks.   Time 8   Period Weeks   Status On-going     PT LONG TERM GOAL #4  Title Walk a community distance with pain not > 3/10.   Time 8   Period Weeks   Status Achieved     PT LONG TERM GOAL #5   Title increase left hip flexion to 110 degrees.   Time 8   Period Weeks   Status On-going               Plan - 05/11/17 1010    Clinical Impression Statement Pt arrived today doing fairly well with both knees, but with complaints that she is sore in the back part of her legs. We discussed her intensity of HS stretching and taking a few days off. She did well with therex for BIl  LEs and STW for Both ITBs   Rehab Potential Good   PT Frequency 2x / week   PT Duration 8 weeks   PT Treatment/Interventions ADLs/Self Care Home Management;Cryotherapy;Electrical Stimulation;Moist Heat;Ultrasound;Gait training;Stair training;Functional mobility training;Patient/family education;Therapeutic exercise;Neuromuscular re-education;Therapeutic activities;Manual techniques;Passive range of motion;Vasopneumatic Device;Dry needling   PT Next Visit Plan Focus on STW to B hip adductors, quads, ITB and HS, then ROM. Right patellar mobility; Nustep for ROM; left SKTC; PROM bilateral knees.  PAIN-FREE bilateral quad strengthening.  Progress to stationary bike.  IFC, IT band STW.   PT Home Exercise  Plan ITB/HS and ADD stretch with strap; butterfly stretch   Consulted and Agree with Plan of Care Patient      Patient will benefit from skilled therapeutic intervention in order to improve the following deficits and impairments:  Pain, Decreased activity tolerance, Hypomobility, Decreased strength, Decreased range of motion, Abnormal gait  Visit Diagnosis: Chronic pain of right knee  Chronic pain of left knee  Stiffness of right knee, not elsewhere classified  Stiffness of left hip, not elsewhere classified  Stiffness of left knee, not elsewhere classified     Problem List Patient Active Problem List   Diagnosis Date Noted  . Biliary sludge 04/29/2016  . Healthcare maintenance 04/28/2016  . Estrogen deficiency 03/05/2016  . Neuralgia, post-herpetic 10/21/2015  . Chronic musculoskeletal pain 10/21/2015  . Hypertension 01/23/2013  . Hyperlipidemia 01/23/2013  . GERD (gastroesophageal reflux disease) 01/23/2013  . Hypokalemia 01/23/2013    RAMSEUR,[name], PTA 05/11/2017, 10:32 AM  [facility] [place], [place], [address] Phone: [phone]   Fax:  [phone]  Name: Kristie Lewis MRN: [id number] Date of Birth: June 28, 1951

## 2017-05-13 ENCOUNTER — Ambulatory Visit: Payer: Medicare Other | Admitting: *Deleted

## 2017-05-13 DIAGNOSIS — M25652 Stiffness of left hip, not elsewhere classified: Secondary | ICD-10-CM | POA: Diagnosis not present

## 2017-05-13 DIAGNOSIS — M25562 Pain in left knee: Secondary | ICD-10-CM | POA: Diagnosis not present

## 2017-05-13 DIAGNOSIS — M25561 Pain in right knee: Secondary | ICD-10-CM | POA: Diagnosis not present

## 2017-05-13 DIAGNOSIS — M25661 Stiffness of right knee, not elsewhere classified: Secondary | ICD-10-CM | POA: Diagnosis not present

## 2017-05-13 DIAGNOSIS — G8929 Other chronic pain: Secondary | ICD-10-CM | POA: Diagnosis not present

## 2017-05-13 DIAGNOSIS — M25662 Stiffness of left knee, not elsewhere classified: Secondary | ICD-10-CM | POA: Diagnosis not present

## 2017-05-13 NOTE — Therapy (Signed)
[facility] [place], [place], [address] Phone: [phone]   Fax:  [phone]  Physical Therapy Treatment  Patient Details  Name: Kristie Lewis MRN: [id number] Date of Birth: 17-May-1951 Referring Provider: Kenn File MD  Encounter Date: 05/13/2017      PT End of Session - 05/13/17 0908    Visit Number 13   Number of Visits 16   Date for PT Re-Evaluation 05/15/17   PT Start Time 0900   PT Stop Time 0958   PT Time Calculation (min) 58 min      Past Medical History:  Diagnosis Date  . Arthritis   . Cataract   . GERD (gastroesophageal reflux disease)   . Gout   . Hyperlipidemia   . Hypertension   . Hypokalemia   . Microscopic colitis   . Renal cancer ([place]) 2010    Past Surgical History:  Procedure Laterality Date  . BREAST SURGERY     right breast bx  . CESAREAN SECTION     3 c-sections  . COLONOSCOPY    . HERNIA REPAIR     umbilical   . right nephrectomy    . TUBAL LIGATION    . UPPER GASTROINTESTINAL ENDOSCOPY      There were no vitals filed for this visit.      Subjective Assessment - 05/13/17 0905    Subjective I did ok after last Rx.    Pertinent History Bilateral knee injuries and left hip pain.   Limitations Walking   How long can you sit comfortably? Pt reporting getting up after sitting is hard   How long can you walk comfortably? Short community distances.   Patient Stated Goals Get out of pain and avoid surgery.   Currently in Pain? Yes   Pain Score 4    Pain Location Knee   Pain Orientation Left   Pain Descriptors / Indicators Sore   Pain Type Chronic pain                         [facility] Adult PT Treatment/Exercise - 05/13/17 0001      Knee/Hip Exercises: Aerobic   Nustep L7 x15 min     Knee/Hip Exercises: Seated   Long Arc Quad Strengthening;Both;2 sets;10 reps;Weights   Long Arc Quad Weight 4 lbs.     Modalities   Modalities Teacher, English as a foreign language Location B ITB   Premod 80-150 hz x 15 mins   Electrical Stimulation Goals Pain;Tone     Manual Therapy   Manual Therapy Myofascial release   Myofascial Release IASTW to Bil  ITB to reduce tone and pain                  PT Short Term Goals - 04/13/17 1534      PT SHORT TERM GOAL #1   Title Independent with an initial HEP.   Time 3   Period Weeks   Status Achieved     PT SHORT TERM GOAL #2   Title Restore full active bilateral knee extension.   Time 3   Period Weeks   Status On-going           PT Long Term Goals - 05/06/17 0936      PT LONG TERM GOAL #1   Title Independent with an advanced HEP.   Status Achieved     PT LONG TERM GOAL #2  Title Active bilateral knee flexion to 115 degrees+ so the patient can perform functional tasks and do so with pain not > 2-3/10.   Status On-going     PT LONG TERM GOAL #3   Title Bilateral hip strength to a solid 5/5 to increase stability for functional tasks.   Time 8   Period Weeks   Status On-going     PT LONG TERM GOAL #4   Title Walk a community distance with pain not > 3/10.   Time 8   Period Weeks   Status Achieved     PT LONG TERM GOAL #5   Title increase left hip flexion to 110 degrees.   Time 8   Period Weeks   Status On-going               Plan - 05/13/17 0910    Clinical Impression Statement Pt did fairly well with Rx today and feels she is able to move better. Less pain in HSs now and was able to do more yesterday. Decreased pain in both knees and ITB's. Small adhesions felt during  STW and IASTM in Bil ITBs and lateral thigh.   PT Home Exercise Plan ITB/HS and ADD stretch with strap; butterfly stretch   Consulted and Agree with Plan of Care Patient      Patient will benefit from skilled therapeutic intervention in order to improve the following deficits and impairments:     Visit Diagnosis: Chronic pain of right knee  Chronic  pain of left knee  Stiffness of right knee, not elsewhere classified  Stiffness of left hip, not elsewhere classified  Stiffness of left knee, not elsewhere classified     Problem List Patient Active Problem List   Diagnosis Date Noted  . Biliary sludge 04/29/2016  . Healthcare maintenance 04/28/2016  . Estrogen deficiency 03/05/2016  . Neuralgia, post-herpetic 10/21/2015  . Chronic musculoskeletal pain 10/21/2015  . Hypertension 01/23/2013  . Hyperlipidemia 01/23/2013  . GERD (gastroesophageal reflux disease) 01/23/2013  . Hypokalemia 01/23/2013    Raynelle Fujikawa,[name] , PTA 05/13/2017, 10:05 AM  [facility] [place], [place], [address] Phone: [phone]   Fax:  [phone]  Name: Kristie Lewis MRN: [id number] Date of Birth: [birth year]

## 2017-05-18 ENCOUNTER — Ambulatory Visit: Payer: Medicare Other | Attending: Family Medicine | Admitting: Physical Therapy

## 2017-05-18 DIAGNOSIS — M25561 Pain in right knee: Secondary | ICD-10-CM | POA: Insufficient documentation

## 2017-05-18 DIAGNOSIS — M25662 Stiffness of left knee, not elsewhere classified: Secondary | ICD-10-CM | POA: Diagnosis not present

## 2017-05-18 DIAGNOSIS — M25652 Stiffness of left hip, not elsewhere classified: Secondary | ICD-10-CM

## 2017-05-18 DIAGNOSIS — M25562 Pain in left knee: Secondary | ICD-10-CM | POA: Diagnosis not present

## 2017-05-18 DIAGNOSIS — M25661 Stiffness of right knee, not elsewhere classified: Secondary | ICD-10-CM | POA: Diagnosis not present

## 2017-05-18 DIAGNOSIS — G8929 Other chronic pain: Secondary | ICD-10-CM

## 2017-05-18 NOTE — Therapy (Addendum)
[facility] [place], [place], [address] Phone: [phone]   Fax:  [phone]  Physical Therapy Treatment  Patient Details  Name: Kristie Lewis MRN: [id number] Date of Birth: [birth year] Referring Provider: Kenn File MD  Encounter Date: 05/18/2017      PT End of Session - 05/18/17 1207    Visit Number 14   Number of Visits 16   Date for PT Re-Evaluation 05/15/17   PT Start Time 0900   PT Stop Time 0952   PT Time Calculation (min) 52 min   Activity Tolerance Patient tolerated treatment well   Behavior During Therapy [facility] for tasks assessed/performed      Past Medical History:  Diagnosis Date  . Arthritis   . Cataract   . GERD (gastroesophageal reflux disease)   . Gout   . Hyperlipidemia   . Hypertension   . Hypokalemia   . Microscopic colitis   . Renal cancer ([place]) 2010    Past Surgical History:  Procedure Laterality Date  . BREAST SURGERY     right breast bx  . CESAREAN SECTION     3 c-sections  . COLONOSCOPY    . HERNIA REPAIR     umbilical   . right nephrectomy    . TUBAL LIGATION    . UPPER GASTROINTESTINAL ENDOSCOPY      There were no vitals filed for this visit.      Subjective Assessment - 05/18/17 1213    Subjective I realize I'm just going to have to live with some pain.   Pain Score 4    Pain Location Knee   Pain Orientation Left;Right   Pain Descriptors / Indicators Sore   Pain Type Chronic pain   Pain Onset More than a month ago                         [facility] Adult PT Treatment/Exercise - 05/18/17 0001      Exercises   Exercises Knee/Hip     Knee/Hip Exercises: Aerobic   Nustep Level 7 x 15 minutes.     Modalities   Modalities Electrical Stimulation;Moist Heat     Moist Heat Therapy   Number Minutes Moist Heat 15 Minutes   Moist Heat Location --  Bilateral knees.     Acupuncturist Location --  Bilateral knees.    Electrical Stimulation Action Pre-mod.   Electrical Stimulation Parameters 80-150 Hz x 15 minutes.   Electrical Stimulation Goals Pain     Manual Therapy   Manual Therapy Joint mobilization   Joint Mobilization Bilateral patellar mobility in all directions x 8 minutes.                  PT Short Term Goals - 04/13/17 1534      PT SHORT TERM GOAL #1   Title Independent with an initial HEP.   Time 3   Period Weeks   Status Achieved     PT SHORT TERM GOAL #2   Title Restore full active bilateral knee extension.   Time 3   Period Weeks   Status On-going           PT Long Term Goals - 05/06/17 0936      PT LONG TERM GOAL #1   Title Independent with an advanced HEP.   Status Achieved     PT LONG TERM GOAL #2   Title Active bilateral knee flexion  to 115 degrees+ so the patient can perform functional tasks and do so with pain not > 2-3/10.   Status On-going     PT LONG TERM GOAL #3   Title Bilateral hip strength to a solid 5/5 to increase stability for functional tasks.   Time 8   Period Weeks   Status On-going     PT LONG TERM GOAL #4   Title Walk a community distance with pain not > 3/10.   Time 8   Period Weeks   Status Achieved     PT LONG TERM GOAL #5   Title increase left hip flexion to 110 degrees.   Time 8   Period Weeks   Status On-going               Plan - 05/18/17 1217    Clinical Impression Statement Patient remarked after treatment:  "My knees feels better."  Patient exhibits a decrease in patellar mobility right > left.      Patient will benefit from skilled therapeutic intervention in order to improve the following deficits and impairments:  Pain, Decreased activity tolerance, Hypomobility, Decreased strength, Decreased range of motion, Abnormal gait  Visit Diagnosis: Chronic pain of right knee  Chronic pain of left knee  Stiffness of right knee, not elsewhere classified  Stiffness of left hip, not elsewhere  classified  Stiffness of left knee, not elsewhere classified     Problem List Patient Active Problem List   Diagnosis Date Noted  . Biliary sludge 04/29/2016  . Healthcare maintenance 04/28/2016  . Estrogen deficiency 03/05/2016  . Neuralgia, post-herpetic 10/21/2015  . Chronic musculoskeletal pain 10/21/2015  . Hypertension 01/23/2013  . Hyperlipidemia 01/23/2013  . GERD (gastroesophageal reflux disease) 01/23/2013  . Hypokalemia 01/23/2013    Elysha Daw, [place] MPT 05/18/2017, 12:20 PM  [facility] [address] [place], [place], [address] Phone: [phone]   Fax:  [phone]  Name: Kristie Lewis MRN: [id number] Date of Birth: 1951/06/23

## 2017-05-25 ENCOUNTER — Ambulatory Visit: Payer: Medicare Other | Admitting: *Deleted

## 2017-05-25 DIAGNOSIS — G8929 Other chronic pain: Secondary | ICD-10-CM

## 2017-05-25 DIAGNOSIS — M25561 Pain in right knee: Secondary | ICD-10-CM | POA: Diagnosis not present

## 2017-05-25 DIAGNOSIS — M25562 Pain in left knee: Principal | ICD-10-CM

## 2017-05-25 DIAGNOSIS — M25661 Stiffness of right knee, not elsewhere classified: Secondary | ICD-10-CM

## 2017-05-25 DIAGNOSIS — M25652 Stiffness of left hip, not elsewhere classified: Secondary | ICD-10-CM | POA: Diagnosis not present

## 2017-05-25 DIAGNOSIS — M25662 Stiffness of left knee, not elsewhere classified: Secondary | ICD-10-CM | POA: Diagnosis not present

## 2017-05-25 NOTE — Therapy (Signed)
[facility] [place], [place], [address] Phone: [phone]   Fax:  [phone]  Physical Therapy Treatment  Patient Details  Name: Kristie Lewis MRN: [id number] Date of Birth: [birth year] Referring Provider: Kenn File MD  Encounter Date: 05/25/2017      PT End of Session - 05/25/17 1211    Visit Number 15   Number of Visits 16   Date for PT Re-Evaluation 05/15/17   PT Start Time 0900   PT Stop Time 0950   PT Time Calculation (min) 50 min      Past Medical History:  Diagnosis Date  . Arthritis   . Cataract   . GERD (gastroesophageal reflux disease)   . Gout   . Hyperlipidemia   . Hypertension   . Hypokalemia   . Microscopic colitis   . Renal cancer ([place]) 2010    Past Surgical History:  Procedure Laterality Date  . BREAST SURGERY     right breast bx  . CESAREAN SECTION     3 c-sections  . COLONOSCOPY    . HERNIA REPAIR     umbilical   . right nephrectomy    . TUBAL LIGATION    . UPPER GASTROINTESTINAL ENDOSCOPY      There were no vitals filed for this visit.      Subjective Assessment - 05/25/17 1157    Subjective I realize I'm just going to have to live with some pain. Both hips/knees are huting today   Pertinent History Bilateral knee injuries and left hip pain.   Limitations Walking   How long can you sit comfortably? Pt reporting getting up after sitting is hard   How long can you walk comfortably? Short community distances.   Patient Stated Goals Get out of pain and avoid surgery.   Currently in Pain? Yes   Pain Score 5    Pain Location Knee   Pain Orientation Left;Right   Pain Descriptors / Indicators Aching;Sore   Pain Type Chronic pain   Pain Onset More than a month ago                         [facility] Adult PT Treatment/Exercise - 05/25/17 0001      Exercises   Exercises Knee/Hip     Knee/Hip Exercises: Aerobic   Nustep Level 7 x 15 minutes.     Knee/Hip  Exercises: Seated   Long Arc Quad Strengthening;Both;2 sets;10 reps;Weights   Long Arc Quad Weight 4 lbs.     Modalities   Modalities Electrical Stimulation;Moist Hotel manager Location B ITB   Premod 80-150 hz x 15 mins   Electrical Stimulation Goals Pain     Manual Therapy   Manual Therapy Joint mobilization   Joint Mobilization Bilateral patellar mobility in all directions                   PT Short Term Goals - 04/13/17 1534      PT SHORT TERM GOAL #1   Title Independent with an initial HEP.   Time 3   Period Weeks   Status Achieved     PT SHORT TERM GOAL #2   Title Restore full active bilateral knee extension.   Time 3   Period Weeks   Status On-going           PT Long Term Goals - 05/06/17 6237  PT LONG TERM GOAL #1   Title Independent with an advanced HEP.   Status Achieved     PT LONG TERM GOAL #2   Title Active bilateral knee flexion to 115 degrees+ so the patient can perform functional tasks and do so with pain not > 2-3/10.   Status On-going     PT LONG TERM GOAL #3   Title Bilateral hip strength to a solid 5/5 to increase stability for functional tasks.   Time 8   Period Weeks   Status On-going     PT LONG TERM GOAL #4   Title Walk a community distance with pain not > 3/10.   Time 8   Period Weeks   Status Achieved     PT LONG TERM GOAL #5   Title increase left hip flexion to 110 degrees.   Time 8   Period Weeks   Status On-going               Plan - 05/25/17 1204    Clinical Impression Statement Pt arrived today with both knees and hips aching. She was able to perform therex for both knees and felt better after Rx. Rx also  focused on bil. patella mobility. Normal estim response.   Rehab Potential Good   PT Frequency 2x / week   PT Duration 8 weeks   PT Treatment/Interventions ADLs/Self Care Home Management;Cryotherapy;Electrical Stimulation;Moist Heat;Ultrasound;Gait  training;Stair training;Functional mobility training;Patient/family education;Therapeutic exercise;Neuromuscular re-education;Therapeutic activities;Manual techniques;Passive range of motion;Vasopneumatic Device;Dry needling   PT Home Exercise Plan ITB/HS and ADD stretch with strap; butterfly stretch   Consulted and Agree with Plan of Care Patient      Patient will benefit from skilled therapeutic intervention in order to improve the following deficits and impairments:  Pain, Decreased activity tolerance, Hypomobility, Decreased strength, Decreased range of motion, Abnormal gait  Visit Diagnosis: Chronic pain of left knee  Chronic pain of right knee  Stiffness of right knee, not elsewhere classified     Problem List Patient Active Problem List   Diagnosis Date Noted  . Biliary sludge 04/29/2016  . Healthcare maintenance 04/28/2016  . Estrogen deficiency 03/05/2016  . Neuralgia, post-herpetic 10/21/2015  . Chronic musculoskeletal pain 10/21/2015  . Hypertension 01/23/2013  . Hyperlipidemia 01/23/2013  . GERD (gastroesophageal reflux disease) 01/23/2013  . Hypokalemia 01/23/2013    Evan Mackie,[name], PTA 05/25/2017, 12:11 PM  [facility] [place], [place], [address] Phone: [phone]   Fax:  [phone]  Name: Kristie Lewis MRN: [id number] Date of Birth: 1950-09-27

## 2017-05-27 ENCOUNTER — Ambulatory Visit: Payer: Medicare Other | Admitting: *Deleted

## 2017-05-27 DIAGNOSIS — G8929 Other chronic pain: Secondary | ICD-10-CM

## 2017-05-27 DIAGNOSIS — M25661 Stiffness of right knee, not elsewhere classified: Secondary | ICD-10-CM

## 2017-05-27 DIAGNOSIS — M25562 Pain in left knee: Principal | ICD-10-CM

## 2017-05-27 DIAGNOSIS — M25652 Stiffness of left hip, not elsewhere classified: Secondary | ICD-10-CM

## 2017-05-27 DIAGNOSIS — M25561 Pain in right knee: Secondary | ICD-10-CM | POA: Diagnosis not present

## 2017-05-27 DIAGNOSIS — M25662 Stiffness of left knee, not elsewhere classified: Secondary | ICD-10-CM

## 2017-05-27 NOTE — Therapy (Signed)
[facility] [place], [place], [address] Phone: [phone]   Fax:  [phone]  Physical Therapy Treatment  Patient Details  Name: Kristie Lewis MRN: [id number] Date of Birth: November 15, 1950 Referring Provider: Kenn File MD  Encounter Date: 05/27/2017      PT End of Session - 05/27/17 0905    Visit Number 16   Number of Visits 16   Date for PT Re-Evaluation 05/15/17   PT Start Time 0900   PT Stop Time 0950   PT Time Calculation (min) 50 min      Past Medical History:  Diagnosis Date  . Arthritis   . Cataract   . GERD (gastroesophageal reflux disease)   . Gout   . Hyperlipidemia   . Hypertension   . Hypokalemia   . Microscopic colitis   . Renal cancer ([place]) 2010    Past Surgical History:  Procedure Laterality Date  . BREAST SURGERY     right breast bx  . CESAREAN SECTION     3 c-sections  . COLONOSCOPY    . HERNIA REPAIR     umbilical   . right nephrectomy    . TUBAL LIGATION    . UPPER GASTROINTESTINAL ENDOSCOPY      There were no vitals filed for this visit.      Subjective Assessment - 05/27/17 0905    Subjective I realize I'm just going to have to live with some pain. Both hips/knees are huting today   DC today   Pertinent History Bilateral knee injuries and left hip pain.   Limitations Walking   How long can you sit comfortably? Pt reporting getting up after sitting is hard   How long can you walk comfortably? Short community distances.   Patient Stated Goals Get out of pain and avoid surgery.   Currently in Pain? Yes   Pain Score 5    Pain Location Knee   Pain Orientation Right;Left   Pain Descriptors / Indicators Sore   Pain Type Chronic pain   Pain Onset More than a month ago   Pain Frequency Constant                         [facility] Adult PT Treatment/Exercise - 05/27/17 0001      Exercises   Exercises Knee/Hip     Knee/Hip Exercises: Aerobic   Stationary  Bike x 10 mins   Nustep Level 7 x 10 minutes.     Modalities   Modalities --     Acupuncturist Location B ITB   Premod 80-150 hz x 15 mins   Electrical Stimulation Goals Pain     Manual Therapy   Manual Therapy Joint mobilization   Joint Mobilization Bilateral patellar mobility in all directions                   PT Short Term Goals - 05/27/17 0933      PT SHORT TERM GOAL #1   Title Independent with an initial HEP.   Period Weeks   Status Achieved     PT SHORT TERM GOAL #2   Title Restore full active bilateral knee extension.   Time 3   Period Weeks   Status Not Met           PT Long Term Goals - 05/27/17 0934      PT LONG TERM GOAL #1   Title Independent with  an advanced HEP.   Period Weeks   Status Achieved     PT LONG TERM GOAL #2   Title Active bilateral knee flexion to 115 degrees+ so the patient can perform functional tasks and do so with pain not > 2-3/10.  NM due to pain 4-6/10 at times   Time 8   Period Weeks   Status Not Met     PT LONG TERM GOAL #3   Title Bilateral hip strength to a solid 5/5 to increase stability for functional tasks.  NM due to weakness 4/5   Time 8   Period Weeks   Status Not Met     PT LONG TERM GOAL #4   Title Walk a community distance with pain not > 3/10.   Time 8   Period Weeks   Status Achieved     PT LONG TERM GOAL #5   Title increase left hip flexion to 110 degrees.   Time 8   Period Weeks   Status Achieved               Plan - 05/27/17 0947    Clinical Impression Statement 3/5 LTGs met today and FOTO 45% limited, 67% IEV. Pt did great today and was able to make full revolutions on the Bike. No modalities today as per Pt.   Clinical Presentation Evolving   Clinical Decision Making Moderate   Rehab Potential Good   PT Frequency 2x / week   PT Duration 8 weeks   PT Treatment/Interventions ADLs/Self Care Home Management;Cryotherapy;Electrical  Stimulation;Moist Heat;Ultrasound;Gait training;Stair training;Functional mobility training;Patient/family education;Therapeutic exercise;Neuromuscular re-education;Therapeutic activities;Manual techniques;Passive range of motion;Vasopneumatic Device;Dry needling   PT Next Visit Plan DC to HEP and GYM program   Consulted and Agree with Plan of Care Patient      Patient will benefit from skilled therapeutic intervention in order to improve the following deficits and impairments:  Pain, Decreased activity tolerance, Hypomobility, Decreased strength, Decreased range of motion, Abnormal gait  Visit Diagnosis: Chronic pain of left knee  Chronic pain of right knee  Stiffness of right knee, not elsewhere classified  Stiffness of left hip, not elsewhere classified  Stiffness of left knee, not elsewhere classified     Problem List Patient Active Problem List   Diagnosis Date Noted  . Biliary sludge 04/29/2016  . Healthcare maintenance 04/28/2016  . Estrogen deficiency 03/05/2016  . Neuralgia, post-herpetic 10/21/2015  . Chronic musculoskeletal pain 10/21/2015  . Hypertension 01/23/2013  . Hyperlipidemia 01/23/2013  . GERD (gastroesophageal reflux disease) 01/23/2013  . Hypokalemia 01/23/2013    RAMSEUR,[name], PTA 05/27/2017, 10:04 AM  [facility] [place], [place], [address] Phone: [phone]   Fax:  [phone]  Name: Kristie Lewis MRN: [id number] Date of Birth: 07/17/1951  PHYSICAL THERAPY DISCHARGE SUMMARY  Visits from Start of Care: 16.  Current functional level related to goals / functional outcomes: See above.   Remaining deficits: Good progress though patient continues to have bilateral hip weakness and pain.   Education / Equipment: HEP. Plan: Patient agrees to discharge.  Patient goals were partially met. Patient is being discharged due to being pleased with the current functional level.  ?????          Mali Applegate MPT

## 2017-06-04 ENCOUNTER — Encounter: Payer: Self-pay | Admitting: *Deleted

## 2017-07-02 ENCOUNTER — Other Ambulatory Visit: Payer: Self-pay | Admitting: Family Medicine

## 2017-09-03 ENCOUNTER — Ambulatory Visit: Payer: Medicare Other | Admitting: Family Medicine

## 2017-09-08 ENCOUNTER — Ambulatory Visit: Payer: Medicare Other | Admitting: Family Medicine

## 2017-09-23 ENCOUNTER — Ambulatory Visit (INDEPENDENT_AMBULATORY_CARE_PROVIDER_SITE_OTHER): Payer: Medicare Other | Admitting: Family Medicine

## 2017-09-23 ENCOUNTER — Encounter: Payer: Self-pay | Admitting: Family Medicine

## 2017-09-23 VITALS — BP 109/71 | HR 72 | Temp 98.1°F | Ht 62.5 in | Wt 204.8 lb

## 2017-09-23 DIAGNOSIS — M25562 Pain in left knee: Secondary | ICD-10-CM

## 2017-09-23 DIAGNOSIS — M7918 Myalgia, other site: Secondary | ICD-10-CM

## 2017-09-23 DIAGNOSIS — M25561 Pain in right knee: Secondary | ICD-10-CM | POA: Diagnosis not present

## 2017-09-23 DIAGNOSIS — G8929 Other chronic pain: Secondary | ICD-10-CM | POA: Diagnosis not present

## 2017-09-23 DIAGNOSIS — M10072 Idiopathic gout, left ankle and foot: Secondary | ICD-10-CM

## 2017-09-23 DIAGNOSIS — M109 Gout, unspecified: Secondary | ICD-10-CM | POA: Insufficient documentation

## 2017-09-23 MED ORDER — COLCHICINE 0.6 MG PO TABS: ORAL_TABLET | ORAL | 1 refills | Status: DC

## 2017-09-23 MED ORDER — DULOXETINE HCL 20 MG PO CPEP: ORAL_CAPSULE | ORAL | 0 refills | Status: DC

## 2017-09-23 NOTE — Progress Notes (Signed)
   HPI  Patient presents today  here to follow-up for chronic medical conditions as well as knee pain.  Patient also has had a gout flare of her left toe.  She started colchicine has had good improvement, she needs refill.  Bilateral knee pain Patient has done physical therapy for several months with no improvement.  She states her knees still bother her, they seem to bother her symmetrically, however she has worsening of the popping in the left knee.  Fibromyalgia/musculoskeletal pain Does not seem to be getting much improvement from Cymbalta, she would like to titrate to see how she does with that.  PMH: Smoking status noted ROS: Per HPI  Objective: BP 109/71   Pulse 72   Temp 98.1 F (36.7 C) (Oral)   Ht 5' 2.5" (1.588 m)   Wt 204 lb 12.8 oz (92.9 kg)   BMI 36.86 kg/m  Gen: NAD, alert, cooperative with exam HEENT: NCAT, EOMI, PERRL CV: RRR, good S1/S2, no murmur Resp: CTABL, no wheezes, non-labored Ext: No edema, warm Neuro: Alert and oriented, No gross deficits L great toe with erythema and mild tenderness. MSK: BL knee without erythema, effusion, bruising, or gross deformity No joint line tenderness.  ligamentously intact to Lachman's and with varus and valgus stress.  Negative McMurray's test   Assessment and plan:  #Bilateral knee pain Persistent, refer to orthopedics Patient has not had much improvement with physical therapy  #Chronic musculoskeletal pain Likely fibromyalgia Discontinue Cymbalta. Feels reasonably well controlled currently  #Gout Left great toe, refill colchicine Current flare   Orders Placed This Encounter  Procedures  . Ambulatory referral to Orthopedic Surgery    Referral Priority:   Routine    Referral Type:   Surgical    Referral Reason:   Specialty Services Required    Referred to Provider:   Gaynelle Arabian, MD    Requested Specialty:   Orthopedic Surgery    Number of Visits Requested:   1    Meds ordered this encounter    Medications  . colchicine (COLCRYS) 0.6 MG tablet    Sig: TAKE 2 TABS AT ONSET OF ATTACK, THEN TAKE 1 TAB EVERY 2 HOURS AS NEEDED UP TO 6 TABLETS TOTAL THEN TAKE TWICE DAILY UNTIL SYMPTOMS CLEAR    Dispense:  30 tablet    Refill:  1  . DULoxetine (CYMBALTA) 20 MG capsule    Sig: Take 2 capsules daily for 2 weeks then 1 capsule daily for 2 weeks then stop.    Dispense:  42 capsule    Refill:  [place], MD [facility] 09/23/2017, 2:28 PM

## 2017-09-23 NOTE — Patient Instructions (Signed)
Great to see you!  Come back in 6 months to follow up and get labs.

## 2017-10-07 ENCOUNTER — Other Ambulatory Visit (INDEPENDENT_AMBULATORY_CARE_PROVIDER_SITE_OTHER): Payer: Medicare Other

## 2017-10-07 ENCOUNTER — Other Ambulatory Visit: Payer: Self-pay | Admitting: Orthopedic Surgery

## 2017-10-07 DIAGNOSIS — M1712 Unilateral primary osteoarthritis, left knee: Secondary | ICD-10-CM | POA: Diagnosis not present

## 2017-10-07 DIAGNOSIS — M25561 Pain in right knee: Secondary | ICD-10-CM

## 2017-10-07 DIAGNOSIS — M1711 Unilateral primary osteoarthritis, right knee: Secondary | ICD-10-CM | POA: Insufficient documentation

## 2017-10-07 DIAGNOSIS — M25562 Pain in left knee: Secondary | ICD-10-CM

## 2017-10-12 DIAGNOSIS — Z124 Encounter for screening for malignant neoplasm of cervix: Secondary | ICD-10-CM | POA: Diagnosis not present

## 2017-10-12 DIAGNOSIS — Z01419 Encounter for gynecological examination (general) (routine) without abnormal findings: Secondary | ICD-10-CM | POA: Diagnosis not present

## 2017-12-24 ENCOUNTER — Telehealth: Payer: Self-pay | Admitting: Family Medicine

## 2017-12-24 NOTE — Telephone Encounter (Signed)
Pt aware that these can be done at appt

## 2017-12-28 ENCOUNTER — Encounter: Payer: Self-pay | Admitting: Family Medicine

## 2017-12-28 ENCOUNTER — Ambulatory Visit (INDEPENDENT_AMBULATORY_CARE_PROVIDER_SITE_OTHER): Payer: Medicare Other | Admitting: Family Medicine

## 2017-12-28 VITALS — BP 103/67 | HR 61 | Temp 97.7°F | Ht 62.5 in | Wt 206.0 lb

## 2017-12-28 DIAGNOSIS — R1032 Left lower quadrant pain: Secondary | ICD-10-CM | POA: Diagnosis not present

## 2017-12-28 LAB — URINALYSIS, COMPLETE
Bilirubin, UA: NEGATIVE
Glucose, UA: NEGATIVE
KETONES UA: NEGATIVE
Nitrite, UA: NEGATIVE
Protein, UA: NEGATIVE
RBC, UA: NEGATIVE
SPEC GRAV UA: 1.01 (ref 1.005–1.030)
Urobilinogen, Ur: 0.2 mg/dL (ref 0.2–1.0)
pH, UA: 5 (ref 5.0–7.5)

## 2017-12-28 LAB — MICROSCOPIC EXAMINATION

## 2017-12-28 MED ORDER — CIPROFLOXACIN HCL 500 MG PO TABS: 500.0000 mg | ORAL_TABLET | Freq: Two times a day (BID) | ORAL | 0 refills | Status: DC

## 2017-12-28 MED ORDER — METRONIDAZOLE 500 MG PO TABS: 500.0000 mg | ORAL_TABLET | Freq: Three times a day (TID) | ORAL | 0 refills | Status: DC

## 2017-12-28 NOTE — Progress Notes (Signed)
   HPI  Patient presents today with left lower quadrant pain.  Patient reports 4-5 days of left lower quadrant pain.  She states that Thursday and Friday the pain was severe, it is improving, she now states that is just tender.  She also has a heavy feeling over her bladder with suprapubic pressure.  She is tolerating food and fluids like usual.  She has had bowel movements like usual.  She has a documented history of diverticula, however never diverticulitis.  PMH: Smoking status noted ROS: Per HPI  Objective: BP 103/67 (BP Location: Left Arm)   Pulse 61   Temp 97.7 F (36.5 C) (Oral)   Ht 5' 2.5" (1.588 m)   Wt 206 lb (93.4 kg)   BMI 37.08 kg/m  Gen: NAD, alert, cooperative with exam HEENT: NCAT CV: RRR, good S1/S2, no murmur Resp: CTABL, no wheezes, non-labored Abd: Soft, no guarding, positive bowel sounds, tenderness to palpation right upper quadrant and left lower quadrant. Ext: No edema, warm Neuro: Alert and oriented, No gross deficits  Assessment and plan:  #Left lower quadrant pain Likely diverticulitis Offered CT, however she is beginning to improve and declines this for now. No red flags Cipro and Flagyl Return to clinic with any concerns   Orders Placed This Encounter  Procedures  . Urinalysis, Complete  . CBC with Differential/Platelet  . CMP14+EGFR    Meds ordered this encounter  Medications  . ciprofloxacin (CIPRO) 500 MG tablet    Sig: Take 1 tablet (500 mg total) by mouth 2 (two) times daily.    Dispense:  14 tablet    Refill:  0  . metroNIDAZOLE (FLAGYL) 500 MG tablet    Sig: Take 1 tablet (500 mg total) by mouth 3 (three) times daily.    Dispense:  21 tablet    Refill:  0    Laroy Apple, MD [facility] 12/28/2017, 1:21 PM

## 2017-12-28 NOTE — Addendum Note (Signed)
Addended by: Timmothy Euler on: 12/28/2017 03:58 PM   Modules accepted: Orders

## 2017-12-28 NOTE — Patient Instructions (Signed)
Great to see you!   Diverticulitis Diverticulitis is when small pockets in your large intestine (colon) get infected or swollen. This causes stomach pain and watery poop (diarrhea). These pouches are called diverticula. They form in people who have a condition called diverticulosis. Follow these instructions at home: Medicines  Take over-the-counter and prescription medicines only as told by your doctor. These include: ? Antibiotics. ? Pain medicines. ? Fiber pills. ? Probiotics. ? Stool softeners.  Do not drive or use heavy machinery while taking prescription pain medicine.  If you were prescribed an antibiotic, take it as told. Do not stop taking it even if you feel better. General instructions  Follow a diet as told by your doctor.  When you feel better, your doctor may tell you to change your diet. You may need to eat a lot of fiber. Fiber makes it easier to poop (have bowel movements). Healthy foods with fiber include: ? Berries. ? Beans. ? Lentils. ? Green vegetables.  Exercise 3 or more times a week. Aim for 30 minutes each time. Exercise enough to sweat and make your heart beat faster.  Keep all follow-up visits as told. This is important. You may need to have an exam of the large intestine. This is called a colonoscopy. Contact a doctor if:  Your pain does not get better.  You have a hard time eating or drinking.  You are not pooping like normal. Get help right away if:  Your pain gets worse.  Your problems do not get better.  Your problems get worse very fast.  You have a fever.  You throw up (vomit) more than one time.  You have poop that is: ? Bloody. ? Black. ? Tarry. Summary  Diverticulitis is when small pockets in your large intestine (colon) get infected or swollen.  Take medicines only as told by your doctor.  Follow a diet as told by your doctor. This information is not intended to replace advice given to you by your health care  provider. Make sure you discuss any questions you have with your health care provider. Document Released: 02/17/2008 Document Revised: 09/17/2016 Document Reviewed: 09/17/2016 Elsevier Interactive Patient Education  2017 Elsevier Inc.  

## 2017-12-29 LAB — CBC WITH DIFFERENTIAL/PLATELET
BASOS: 1 %
Basophils Absolute: 0 10*3/uL (ref 0.0–0.2)
EOS (ABSOLUTE): 0.2 10*3/uL (ref 0.0–0.4)
EOS: 3 %
HEMATOCRIT: 39.3 % (ref 34.0–46.6)
HEMOGLOBIN: 13 g/dL (ref 11.1–15.9)
IMMATURE GRANS (ABS): 0 10*3/uL (ref 0.0–0.1)
IMMATURE GRANULOCYTES: 0 %
LYMPHS: 33 %
Lymphocytes Absolute: 2.5 10*3/uL (ref 0.7–3.1)
MCH: 30.2 pg (ref 26.6–33.0)
MCHC: 33.1 g/dL (ref 31.5–35.7)
MCV: 91 fL (ref 79–97)
MONOCYTES: 8 %
Monocytes Absolute: 0.6 10*3/uL (ref 0.1–0.9)
NEUTROS PCT: 55 %
Neutrophils Absolute: 4.3 10*3/uL (ref 1.4–7.0)
Platelets: 322 10*3/uL (ref 150–379)
RBC: 4.31 x10E6/uL (ref 3.77–5.28)
RDW: 14.3 % (ref 12.3–15.4)
WBC: 7.6 10*3/uL (ref 3.4–10.8)

## 2017-12-29 LAB — CMP14+EGFR
A/G RATIO: 1.3 (ref 1.2–2.2)
ALT: 39 IU/L — ABNORMAL HIGH (ref 0–32)
AST: 35 IU/L (ref 0–40)
Albumin: 4.2 g/dL (ref 3.6–4.8)
Alkaline Phosphatase: 76 IU/L (ref 39–117)
BUN/Creatinine Ratio: 10 — ABNORMAL LOW (ref 12–28)
BUN: 11 mg/dL (ref 8–27)
Bilirubin Total: 0.6 mg/dL (ref 0.0–1.2)
CALCIUM: 10.2 mg/dL (ref 8.7–10.3)
CO2: 24 mmol/L (ref 20–29)
CREATININE: 1.13 mg/dL — AB (ref 0.57–1.00)
Chloride: 101 mmol/L (ref 96–106)
GFR, EST AFRICAN AMERICAN: 58 mL/min/{1.73_m2} — AB (ref >59)
GFR, EST NON AFRICAN AMERICAN: 50 mL/min/{1.73_m2} — AB (ref >59)
GLOBULIN, TOTAL: 3.2 g/dL (ref 1.5–4.5)
Glucose: 104 mg/dL — ABNORMAL HIGH (ref 65–99)
POTASSIUM: 4.2 mmol/L (ref 3.5–5.2)
SODIUM: 139 mmol/L (ref 134–144)
TOTAL PROTEIN: 7.4 g/dL (ref 6.0–8.5)

## 2017-12-29 LAB — URINE CULTURE

## 2018-02-03 ENCOUNTER — Other Ambulatory Visit: Payer: Self-pay | Admitting: Family Medicine

## 2018-02-14 ENCOUNTER — Ambulatory Visit: Payer: Medicare Other | Admitting: Family Medicine

## 2018-02-25 ENCOUNTER — Encounter: Payer: Self-pay | Admitting: Family Medicine

## 2018-02-25 ENCOUNTER — Ambulatory Visit (INDEPENDENT_AMBULATORY_CARE_PROVIDER_SITE_OTHER): Payer: Medicare Other | Admitting: Family Medicine

## 2018-02-25 VITALS — BP 103/70 | HR 58 | Temp 96.8°F | Ht 62.5 in | Wt 205.0 lb

## 2018-02-25 DIAGNOSIS — R5382 Chronic fatigue, unspecified: Secondary | ICD-10-CM

## 2018-02-25 DIAGNOSIS — E785 Hyperlipidemia, unspecified: Secondary | ICD-10-CM

## 2018-02-25 DIAGNOSIS — I10 Essential (primary) hypertension: Secondary | ICD-10-CM

## 2018-02-25 MED ORDER — ATENOLOL 25 MG PO TABS: ORAL_TABLET | ORAL | 3 refills | Status: DC

## 2018-02-25 MED ORDER — FUROSEMIDE 20 MG PO TABS: ORAL_TABLET | ORAL | 3 refills | Status: DC

## 2018-02-25 MED ORDER — POTASSIUM CHLORIDE CRYS ER 20 MEQ PO TBCR: EXTENDED_RELEASE_TABLET | ORAL | 3 refills | Status: DC

## 2018-02-25 NOTE — Patient Instructions (Signed)
Great to see you!  Come back in 4-6 months to see Dr. Lajuana Ripple

## 2018-02-25 NOTE — Progress Notes (Signed)
   HPI  Patient presents today for chronic medical conditions.  She is a retired Therapist, sports, she has history of MI and multiple statin intolerance.  Patient will consider Repatha, we discussed sending her to cardiology to work with a clinical pharmacist if she desires this.  Patient states that she is watching her diet moderately. She reports good medication compliance with potassium, Lasix, and atenolol, she tolerates it well.   PMH: Smoking status noted ROS: Per HPI  Objective: BP 103/70 (BP Location: Left Arm)   Pulse (!) 58   Temp (!) 96.8 F (36 C) (Oral)   Ht 5' 2.5" (1.588 m)   Wt 205 lb (93 kg)   BMI 36.90 kg/m  Gen: NAD, alert, cooperative with exam HEENT: NCAT CV: RRR, good S1/S2, no murmur Resp: CTABL, no wheezes, non-labored Ext: No edema, warm Neuro: Alert and oriented, No gross deficits  Assessment and plan:  #Hyperlipidemia Severe range with history of reported MI (I cannot find evidence of this documented) Discussed Repatha, if she would like this considering that there is no clear documentation I would recommend referral to Dr. Domenic Polite, her husband sees him, to see if she would be appropriate to see the clinical pharmacist and pursue Repatha.   #Hypertension Doing well on atenolol, no changes  #Chronic fatigue Multifactorial, discussed starting regular exercise regimen TSH and CBC today   Orders Placed This Encounter  Procedures  . CBC with Differential/Platelet  . CMP14+EGFR  . Lipid panel  . TSH    Meds ordered this encounter  Medications  . atenolol (TENORMIN) 25 MG tablet    Sig: TAKE ONE (1) TABLET EACH DAY    Dispense:  90 tablet    Refill:  3  . potassium chloride SA (K-DUR,KLOR-CON) 20 MEQ tablet    Sig: TAKE ONE (1) TABLET EACH DAY    Dispense:  90 tablet    Refill:  3  . furosemide (LASIX) 20 MG tablet    Sig: TAKE ONE (1) TABLET EACH DAY    Dispense:  90 tablet    Refill:  3    Laroy Apple, MD [facility] 02/25/2018, 8:58 AM

## 2018-02-26 LAB — CBC WITH DIFFERENTIAL/PLATELET
BASOS: 1 %
Basophils Absolute: 0 10*3/uL (ref 0.0–0.2)
EOS (ABSOLUTE): 0.2 10*3/uL (ref 0.0–0.4)
EOS: 4 %
HEMATOCRIT: 40.2 % (ref 34.0–46.6)
Hemoglobin: 13 g/dL (ref 11.1–15.9)
IMMATURE GRANS (ABS): 0 10*3/uL (ref 0.0–0.1)
IMMATURE GRANULOCYTES: 0 %
LYMPHS: 36 %
Lymphocytes Absolute: 1.9 10*3/uL (ref 0.7–3.1)
MCH: 30.2 pg (ref 26.6–33.0)
MCHC: 32.3 g/dL (ref 31.5–35.7)
MCV: 94 fL (ref 79–97)
MONOCYTES: 8 %
Monocytes Absolute: 0.4 10*3/uL (ref 0.1–0.9)
NEUTROS PCT: 51 %
Neutrophils Absolute: 2.8 10*3/uL (ref 1.4–7.0)
Platelets: 239 10*3/uL (ref 150–450)
RBC: 4.3 x10E6/uL (ref 3.77–5.28)
RDW: 14.8 % (ref 12.3–15.4)
WBC: 5.4 10*3/uL (ref 3.4–10.8)

## 2018-02-26 LAB — CMP14+EGFR
A/G RATIO: 1.4 (ref 1.2–2.2)
ALT: 31 IU/L (ref 0–32)
AST: 28 IU/L (ref 0–40)
Albumin: 4.2 g/dL (ref 3.6–4.8)
Alkaline Phosphatase: 70 IU/L (ref 39–117)
BUN/Creatinine Ratio: 17 (ref 12–28)
BUN: 20 mg/dL (ref 8–27)
Bilirubin Total: 0.6 mg/dL (ref 0.0–1.2)
CALCIUM: 10.1 mg/dL (ref 8.7–10.3)
CO2: 22 mmol/L (ref 20–29)
CREATININE: 1.2 mg/dL — AB (ref 0.57–1.00)
Chloride: 103 mmol/L (ref 96–106)
GFR calc Af Amer: 54 mL/min/{1.73_m2} — ABNORMAL LOW (ref >59)
GFR, EST NON AFRICAN AMERICAN: 47 mL/min/{1.73_m2} — AB (ref >59)
GLOBULIN, TOTAL: 2.9 g/dL (ref 1.5–4.5)
Glucose: 144 mg/dL — ABNORMAL HIGH (ref 65–99)
POTASSIUM: 4.6 mmol/L (ref 3.5–5.2)
SODIUM: 141 mmol/L (ref 134–144)
Total Protein: 7.1 g/dL (ref 6.0–8.5)

## 2018-02-26 LAB — TSH: TSH: 4.73 u[IU]/mL — ABNORMAL HIGH (ref 0.450–4.500)

## 2018-02-26 LAB — LIPID PANEL
CHOL/HDL RATIO: 6.8 ratio — AB (ref 0.0–4.4)
Cholesterol, Total: 307 mg/dL — ABNORMAL HIGH (ref 100–199)
HDL: 45 mg/dL (ref >39)
LDL CALC: 224 mg/dL — AB (ref 0–99)
TRIGLYCERIDES: 190 mg/dL — AB (ref 0–149)
VLDL Cholesterol Cal: 38 mg/dL (ref 5–40)

## 2018-03-01 ENCOUNTER — Telehealth: Payer: Self-pay | Admitting: Family Medicine

## 2018-03-02 ENCOUNTER — Other Ambulatory Visit: Payer: Self-pay | Admitting: *Deleted

## 2018-03-02 DIAGNOSIS — E039 Hypothyroidism, unspecified: Secondary | ICD-10-CM

## 2018-03-02 DIAGNOSIS — E781 Pure hyperglyceridemia: Secondary | ICD-10-CM

## 2018-03-02 DIAGNOSIS — R7309 Other abnormal glucose: Secondary | ICD-10-CM

## 2018-03-02 MED ORDER — LEVOTHYROXINE SODIUM 50 MCG PO TABS: 50.0000 ug | ORAL_TABLET | Freq: Every day | ORAL | 3 refills | Status: DC

## 2018-03-02 NOTE — Telephone Encounter (Signed)
Patient was called .

## 2018-03-05 ENCOUNTER — Encounter: Payer: Self-pay | Admitting: Family Medicine

## 2018-03-10 ENCOUNTER — Encounter: Payer: Self-pay | Admitting: Cardiology

## 2018-03-10 NOTE — Progress Notes (Signed)
Cardiology Office Note  Date: 03/11/2018   ID: Kristie, Lewis Jun 20, 1951, MRN [id number]  PCP: Timmothy Euler, MD  Consulting Cardiologist: Rozann Lesches, MD   Chief Complaint  Patient presents with  . Hyperlipidemia    History of Present Illness: Kristie Lewis is a 67 y.o. female referred for cardiology consultation by Dr. Wendi Snipes for the assessment of hyperlipidemia.  She is here with her husband, also a patient of mine.  Recent lipid panel is outlined below with total cholesterol 307 and LDL 224 consistent with familial hyperlipidemia.  She has a history of intolerance to multiple statins (Pravachol, Zocor, Lipitor, Crestor) and also Zetia.  She describes significant fatigue and weakness on these medications.  She has a history of hypertension, no definitive ischemic heart disease.  She underwent a cardiac catheterization approximately 15 years ago that was reassuring by report.  I personally reviewed her ECG today which shows sinus rhythm with right bundle branch block.  Past Medical History:  Diagnosis Date  . Arthritis   . Cataract   . GERD (gastroesophageal reflux disease)   . Gout   . Hyperlipidemia   . Hypertension   . Microscopic colitis   . Renal cancer ([place]) 2010    Past Surgical History:  Procedure Laterality Date  . BREAST SURGERY     right breast bx  . CESAREAN SECTION     3 c-sections  . COLONOSCOPY    . HERNIA REPAIR     umbilical   . NEPHRECTOMY Right   . TUBAL LIGATION    . UPPER GASTROINTESTINAL ENDOSCOPY      Current Outpatient Medications  Medication Sig Dispense Refill  . aspirin EC 81 MG tablet Take 81 mg by mouth 2 (two) times daily.    Marland Kitchen atenolol (TENORMIN) 25 MG tablet TAKE ONE (1) TABLET EACH DAY 90 tablet 3  . B Complex Vitamins (B-COMPLEX/B-12 PO) Take by mouth.    . budesonide (ENTOCORT EC) 3 MG 24 hr capsule Take by mouth as needed.     Marland Kitchen co-enzyme Q-10 50 MG capsule Take 50 mg by mouth daily.    . colchicine  (COLCRYS) 0.6 MG tablet TAKE 2 TABS AT ONSET OF ATTACK, THEN TAKE 1 TAB EVERY 2 HOURS AS NEEDED UP TO 6 TABLETS TOTAL THEN TAKE TWICE DAILY UNTIL SYMPTOMS CLEAR 30 tablet 1  . Famotidine (PEPCID PO) Take by mouth.    . furosemide (LASIX) 20 MG tablet TAKE ONE (1) TABLET EACH DAY 90 tablet 3  . gabapentin (NEURONTIN) 300 MG capsule Take 1 capsule (300 mg total) by mouth at bedtime. 30 capsule 2  . KRILL OIL 1000 MG CAPS Take by mouth.    . L-Lysine 1000 MG TABS Take by mouth.    . levothyroxine (SYNTHROID, LEVOTHROID) 50 MCG tablet Take 1 tablet (50 mcg total) by mouth daily. 30 tablet 3  . Misc Natural Products (OSTEO BI-FLEX ADV DOUBLE ST PO) Take 2 capsules by mouth daily.     . potassium chloride SA (K-DUR,KLOR-CON) 20 MEQ tablet TAKE ONE (1) TABLET EACH DAY 90 tablet 3  . UNABLE TO FIND Always eye drops     No current facility-administered medications for this visit.    Allergies:  Patient has no known allergies.   Social History: The patient  reports that she has never smoked. She has never used smokeless tobacco. She reports that she does not drink alcohol or use drugs.   Family History: The patient's family history  includes Diabetes in her father; Heart disease in her father; Hyperlipidemia in her mother; Hypertension in her father; Stroke in her father and mother.   ROS:  Please see the history of present illness. Otherwise, complete review of systems is positive for none.  All other systems are reviewed and negative.   Physical Exam: VS:  BP 107/62   Pulse 67   Ht 5\' 3"  (1.6 m)   Wt 205 lb (93 kg)   SpO2 96%   BMI 36.31 kg/m , BMI Body mass index is 36.31 kg/m.  Wt Readings from Last 3 Encounters:  03/11/18 205 lb (93 kg)  02/25/18 205 lb (93 kg)  12/28/17 206 lb (93.4 kg)    General: Patient appears comfortable at rest. HEENT: Conjunctiva and lids normal, oropharynx clear. Neck: Supple, no elevated JVP or carotid bruits, no thyromegaly. Lungs: Clear to auscultation,  nonlabored breathing at rest. Cardiac: Regular rate and rhythm, no S3 or significant systolic murmur. Abdomen: Soft, nontender, bowel sounds present. Extremities: No pitting edema, distal pulses 2+. Skin: Warm and dry. Musculoskeletal: No kyphosis. Neuropsychiatric: Alert and oriented x3, affect grossly appropriate.  ECG: There is no old tracing available for comparison today.  Recent Labwork: 02/25/2018: ALT 31; AST 28; BUN 20; Creatinine, Ser 1.20; Hemoglobin 13.0; Platelets 239; Potassium 4.6; Sodium 141; TSH 4.730     Component Value Date/Time   CHOL 307 (H) 02/25/2018 1056   TRIG 190 (H) 02/25/2018 1056   TRIG 177 (H) 05/24/2014 1208   HDL 45 02/25/2018 1056   HDL 42 05/24/2014 1208   CHOLHDL 6.8 (H) 02/25/2018 1056   LDLCALC 224 (H) 02/25/2018 1056   LDLCALC 262 (H) 05/24/2014 1208    Other Studies Reviewed Today:  Chest x-ray 01/04/2017: FINDINGS: The heart size and mediastinal contours are within normal limits. Both lungs are clear. The visualized skeletal structures are unremarkable.  IMPRESSION: No active cardiopulmonary disease.  Assessment and Plan:  1.  Familial hyperlipidemia with total cholesterol 307 and LDL 224.  She has a history of multiple statin intolerances as discussed above.  No personal history of hypertension but no definitive ischemic heart disease.  We will check into cost and coverage for Repatha as an option.  She has been somewhat hesitant to consider this but is willing at this point.  2.  Essential hypertension, currently on atenolol.  Blood pressure is normal today.  3.  Hypothyroidism, on Synthroid.  Recent TSH normal.  She follows with Dr. Wendi Snipes.  Current medicines were reviewed with the patient today.   Orders Placed This Encounter  Procedures  . EKG 12-Lead    Disposition: Check into Repatha cost and coverage, can determine follow-up if it is initiated.  Signed, Satira Sark, MD, [facility] 03/11/2018 10:56 AM    [facility] [place], [place], [place] [address] Phone: [phone]; Fax: [phone]

## 2018-03-11 ENCOUNTER — Ambulatory Visit (INDEPENDENT_AMBULATORY_CARE_PROVIDER_SITE_OTHER): Payer: Medicare Other | Admitting: Cardiology

## 2018-03-11 ENCOUNTER — Encounter: Payer: Self-pay | Admitting: Cardiology

## 2018-03-11 ENCOUNTER — Other Ambulatory Visit: Payer: Self-pay

## 2018-03-11 VITALS — BP 107/62 | HR 67 | Ht 63.0 in | Wt 205.0 lb

## 2018-03-11 DIAGNOSIS — E7849 Other hyperlipidemia: Secondary | ICD-10-CM | POA: Diagnosis not present

## 2018-03-11 DIAGNOSIS — E039 Hypothyroidism, unspecified: Secondary | ICD-10-CM | POA: Diagnosis not present

## 2018-03-11 DIAGNOSIS — I10 Essential (primary) hypertension: Secondary | ICD-10-CM | POA: Diagnosis not present

## 2018-03-11 MED ORDER — EVOLOCUMAB 140 MG/ML ~~LOC~~ SOAJ: 140.0000 mg | SUBCUTANEOUS | 2 refills | Status: DC

## 2018-03-11 NOTE — Patient Instructions (Addendum)
Medication Instructions:   Your physician recommends that you continue on your current medications as directed. Please refer to the Current Medication list given to you today.  Labwork:  NONE  Testing/Procedures:  NONE  Follow-Up:  Your physician recommends that you schedule a follow-up appointment in: to be determined.  Any Other Special Instructions Will Be Listed Below (If Applicable).  We will contact you about repatha. At that time, you will let us know if you want to start this medication.  If you need a refill on your cardiac medications before your next appointment, please call your pharmacy.

## 2018-03-11 NOTE — Addendum Note (Signed)
Addended by: Merlene Laughter on: 03/11/2018 11:04 AM   Modules accepted: Orders

## 2018-03-15 ENCOUNTER — Telehealth: Payer: Self-pay | Admitting: Cardiology

## 2018-03-15 NOTE — Telephone Encounter (Signed)
Patient notified it was denied and appeal would have to be done.

## 2018-03-15 NOTE — Telephone Encounter (Signed)
Repatha.   Called about her medication stated insurance wants pre-authorization said she has been waiting on a call back from nurse in reference to this

## 2018-03-28 ENCOUNTER — Telehealth: Payer: Self-pay | Admitting: *Deleted

## 2018-03-28 MED ORDER — ALIROCUMAB 150 MG/ML ~~LOC~~ SOPN: 150.0000 mg | PEN_INJECTOR | SUBCUTANEOUS | 1 refills | Status: DC

## 2018-03-28 NOTE — Telephone Encounter (Signed)
Would you please then check with one of our Pharm.D.'s in the lipid clinic in [place] to see if perhaps there is another way we can get this covered?

## 2018-03-28 NOTE — Telephone Encounter (Signed)
Patient agrees to take praluent but says she can not afford it. Patient said she spoke with her pharmacist about it and it would cost over over $1000.

## 2018-03-28 NOTE — Telephone Encounter (Signed)
I talked to patient's pharmacy today @5 :10pm.   Called patient at [phone] - no answer  Praluent prior authorization was approved by patient's insurance. Her LOCAL PHARMACY is unable to order it or process the medication due to cost.   [facility] or [facility] for processing.   Please talk to patient and send prescription [facility]

## 2018-03-29 MED ORDER — ALIROCUMAB 150 MG/ML ~~LOC~~ SOPN: 150.0000 mg | PEN_INJECTOR | SUBCUTANEOUS | 1 refills | Status: DC

## 2018-03-29 NOTE — Telephone Encounter (Signed)
Patient agrees to have praluent prescription sent to [facility].

## 2018-03-29 NOTE — Addendum Note (Signed)
Addended by: Merlene Laughter on: 03/29/2018 10:59 AM   Modules accepted: Orders

## 2018-06-01 DIAGNOSIS — Z01818 Encounter for other preprocedural examination: Secondary | ICD-10-CM | POA: Diagnosis not present

## 2018-06-01 DIAGNOSIS — H2511 Age-related nuclear cataract, right eye: Secondary | ICD-10-CM | POA: Diagnosis not present

## 2018-06-01 DIAGNOSIS — H25811 Combined forms of age-related cataract, right eye: Secondary | ICD-10-CM | POA: Diagnosis not present

## 2018-06-15 ENCOUNTER — Ambulatory Visit (INDEPENDENT_AMBULATORY_CARE_PROVIDER_SITE_OTHER): Payer: Medicare Other | Admitting: Family Medicine

## 2018-06-15 ENCOUNTER — Encounter: Payer: Self-pay | Admitting: Family Medicine

## 2018-06-15 VITALS — BP 128/69 | HR 57 | Temp 97.4°F | Ht 63.0 in | Wt 203.0 lb

## 2018-06-15 DIAGNOSIS — R7309 Other abnormal glucose: Secondary | ICD-10-CM | POA: Diagnosis not present

## 2018-06-15 DIAGNOSIS — M7581 Other shoulder lesions, right shoulder: Secondary | ICD-10-CM

## 2018-06-15 DIAGNOSIS — Z2821 Immunization not carried out because of patient refusal: Secondary | ICD-10-CM | POA: Diagnosis not present

## 2018-06-15 DIAGNOSIS — R7989 Other specified abnormal findings of blood chemistry: Secondary | ICD-10-CM | POA: Diagnosis not present

## 2018-06-15 DIAGNOSIS — E785 Hyperlipidemia, unspecified: Secondary | ICD-10-CM | POA: Diagnosis not present

## 2018-06-15 LAB — BAYER DCA HB A1C WAIVED: HB A1C: 6.6 % (ref <7.0)

## 2018-06-15 MED ORDER — COLCHICINE 0.6 MG PO TABS: ORAL_TABLET | ORAL | 1 refills | Status: DC

## 2018-06-15 NOTE — Progress Notes (Signed)
Subjective: CC: right arm pain PCP: Janora Norlander, DO OVF:IEPPI W Vetsch is a 67 y.o. female presenting to clinic today for:  1. Right arm pain Patient reports a greater than 71-month history of sharp intermittent pains that occur along the lateral aspect of the right arm.  She points to the lower deltoid/triceps area.  She notes that the pain seems to be working its way up and now is radiating into the upper arm.  Denies any history of neck or shoulder injuries.  She has not used anything except for an occasional Aleve for symptoms.  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  She does have occasional bilateral hand numbness and tingling that occurs at nighttime.  No appreciable weakness.  2.  Abnormal thyroid test Patient reports that she had an abnormal TSH at last visit with previous PCP.  She was put on Synthroid 50 mcg daily but states it did not seem to help her poor energy much.  She continues to have low energy and actually discontinued the medication after 1 month of use.  She has alternating constipation and diarrhea.  No difficulty swallowing but possible questionable change in voice.  She denies any heat or cold intolerance but states that she has occasional hot flashes.  No visual changes.  No resting tremor.  She does have a strong family history of thyroid disease and her daughter, sister and maternal grandmother.  No history of surgery or radiation to the neck.  She does not take any hair skin and nail vitamins.   ROS: Per HPI  No Known Allergies Past Medical History:  Diagnosis Date  . Arthritis   . Cataract   . GERD (gastroesophageal reflux disease)   . Gout   . Hyperlipidemia   . Hypertension   . Microscopic colitis   . Renal cancer ([place]) 2010    Current Outpatient Medications:  .  aspirin EC 81 MG tablet, Take 81 mg by mouth 2 (two) times daily., Disp: , Rfl:  .  atenolol (TENORMIN) 25 MG tablet,  TAKE ONE (1) TABLET EACH DAY, Disp: 90 tablet, Rfl: 3 .  B Complex Vitamins (B-COMPLEX/B-12 PO), Take by mouth., Disp: , Rfl:  .  budesonide (ENTOCORT EC) 3 MG 24 hr capsule, Take by mouth as needed. , Disp: , Rfl:  .  co-enzyme Q-10 50 MG capsule, Take 50 mg by mouth daily., Disp: , Rfl:  .  colchicine (COLCRYS) 0.6 MG tablet, TAKE 2 TABS AT ONSET OF ATTACK, THEN TAKE 1 TAB EVERY 2 HOURS AS NEEDED UP TO 6 TABLETS TOTAL THEN TAKE TWICE DAILY UNTIL SYMPTOMS CLEAR, Disp: 30 tablet, Rfl: 1 .  furosemide (LASIX) 20 MG tablet, TAKE ONE (1) TABLET EACH DAY, Disp: 90 tablet, Rfl: 3 .  gabapentin (NEURONTIN) 300 MG capsule, Take 1 capsule (300 mg total) by mouth at bedtime., Disp: 30 capsule, Rfl: 2 .  KRILL OIL 1000 MG CAPS, Take by mouth., Disp: , Rfl:  .  L-Lysine 1000 MG TABS, Take by mouth., Disp: , Rfl:  .  Misc Natural Products (OSTEO BI-FLEX ADV DOUBLE ST PO), Take 2 capsules by mouth daily. , Disp: , Rfl:  .  potassium chloride SA (K-DUR,KLOR-CON) 20 MEQ tablet, TAKE ONE (1) TABLET EACH DAY, Disp: 90 tablet, Rfl: 3 .  UNABLE TO FIND, Always eye drops, Disp: , Rfl:  Social History   Socioeconomic History  . Marital status: Married  Spouse name: Not on file  . Number of children: Not on file  . Years of education: Not on file  . Highest education level: Not on file  Occupational History  . Not on file  Social Needs  . Financial resource strain: Not on file  . Food insecurity:    Worry: Not on file    Inability: Not on file  . Transportation needs:    Medical: Not on file    Non-medical: Not on file  Tobacco Use  . Smoking status: Never Smoker  . Smokeless tobacco: Never Used  Substance and Sexual Activity  . Alcohol use: No  . Drug use: No  . Sexual activity: Not on file  Lifestyle  . Physical activity:    Days per week: Not on file    Minutes per session: Not on file  . Stress: Not on file  Relationships  . Social connections:    Talks on phone: Not on file    Gets  together: Not on file    Attends religious service: Not on file    Active member of club or organization: Not on file    Attends meetings of clubs or organizations: Not on file    Relationship status: Not on file  . Intimate partner violence:    Fear of current or ex partner: Not on file    Emotionally abused: Not on file    Physically abused: Not on file    Forced sexual activity: Not on file  Other Topics Concern  . Not on file  Social History Narrative  . Not on file   Family History  Problem Relation Age of Onset  . Hyperlipidemia Mother   . Stroke Mother   . Diabetes Father   . Heart disease Father   . Hypertension Father   . Stroke Father   . Colon cancer Neg Hx   . Esophageal cancer Neg Hx   . Stomach cancer Neg Hx   . Rectal cancer Neg Hx     Objective: Office vital signs reviewed. BP 128/69   Pulse (!) 57   Temp (!) 97.4 F (36.3 C) (Oral)   Ht 5\' 3"  (1.6 m)   Wt 203 lb (92.1 kg)   BMI 35.96 kg/m   Physical Examination:  General: Awake, alert, obese, No acute distress HEENT: Normal    Neck: No masses palpated. No lymphadenopathy; no thyromegaly but there are questionable small nodules bilaterally.    Eyes: Extraocular membranes intact, sclera white.  No exophthalmos    Throat: moist mucus membranes Cardio: regular rate and rhythm, S1S2 heard, no murmurs appreciated Pulm: clear to auscultation bilaterally, no wheezes, rhonchi or rales; normal work of breathing on room air Extremities: warm, well perfused, No edema, cyanosis or clubbing; +2 pulses bilaterally MSK:   Right shoulder: She has full active range of motion in all planes.  She has point tenderness to the lateral aspect of the rotator cuff.  She has tenderness down the border of the deltoid and trapezius.  Positive empty can test.  Negative Hawkins. Skin: dry; intact; no rashes or lesions; normal temperature Neuro: 5/5 UE Strength and light touch sensation grossly intact, no resting  tremor  Assessment/ Plan: 67 y.o. female   1. Rotator cuff tendinitis, right I suspect that she has rotator cuff tendinitis.  We discussed that this could be evaluated further by orthopedics under ultrasound should she desire but she wished to hold off on this since it seems to be getting  somewhat less frequent.  I did advise her to consider avoiding NSAIDs given the history of renal impairment and depending upon topicals or Tylenol for pain relief.  If symptoms worsen, or she develops any worrisome symptoms or signs, she will contact the office and I will place a referral to orthopedics for evaluation.  2. Abnormal TSH Mildly elevated but patient with symptoms of fatigue.  Her weight-based dose would be closer to 88 to 100 mcg daily.  We discussed that she may simply need an increased dose.  Will obtain full thyroid panel and evaluate for possible autoimmune component.  Referral placed to endocrinology per her request as well today.  She will follow-up with me on an as-needed basis for this issue. - Thyroid Panel With TSH - Thyroid Peroxidase Antibody - Thyroglobulin antibody - Ambulatory referral to Endocrinology  3. Abnormal glucose Noted to be elevated on last metabolic panel.  Will obtain A1c. - Bayer DCA Hb A1c Waived   Orders Placed This Encounter  Procedures  . Thyroid Panel With TSH  . Thyroid Peroxidase Antibody  . Thyroglobulin antibody  . Bayer DCA Hb A1c Waived  . Ambulatory referral to Endocrinology    Referral Priority:   Routine    Referral Type:   Consultation    Referral Reason:   Specialty Services Required    Number of Visits Requested:   [place], [place] [phone]

## 2018-06-15 NOTE — Patient Instructions (Addendum)
Rotator Cuff Tendinitis Rotator cuff tendinitis is inflammation of the tough, cord-like bands that connect muscle to bone (tendons) in the rotator cuff. The rotator cuff includes all of the muscles and tendons that connect the arm to the shoulder. The rotator cuff holds the head of the upper arm bone (humerus) in the cup (fossa) of the shoulder blade (scapula). This condition can lead to a long-lasting (chronic) tear. The tear may be partial or complete. What are the causes? This condition is usually caused by overusing the rotator cuff. What increases the risk? This condition is more likely to develop in athletes and workers who frequently use their shoulder or reach over their heads. This can include activities such as:  Tennis.  Baseball or softball.  Swimming.  Construction work.  Painting.  What are the signs or symptoms? Symptoms of this condition include:  Pain spreading (radiating) from the shoulder to the upper arm.  Swelling and tenderness in front of the shoulder.  Pain when reaching, pulling, or lifting the arm above the head.  Pain when lowering the arm from above the head.  Minor pain in the shoulder when resting.  Increased pain in the shoulder at night.  Difficulty placing the arm behind the back.  How is this diagnosed? This condition is diagnosed with a medical history and physical exam. Tests may also be done, including:  X-rays.  MRI.  Ultrasounds.  CT or MR arthrogram. During this test, a contrast material is injected and then images are taken.  How is this treated? Treatment for this condition depends on the severity of the condition. In less severe cases, treatment may include:  Rest. This may be done with a sling that holds the shoulder still (immobilization). Your health care provider may also recommend avoiding activities that involve lifting your arm over your head.  Icing the shoulder.  Anti-inflammatory medicines, such as aspirin or  ibuprofen.  In more severe cases, treatment may include:  Physical therapy.  Steroid injections.  Surgery.  Follow these instructions at home: If you have a sling:  Wear the sling as told by your health care provider. Remove it only as told by your health care provider.  Loosen the sling if your fingers tingle, become numb, or turn cold and blue.  Keep the sling clean.  If the sling is not waterproof, do not let it get wet. Remove it, if allowed, or cover it with a watertight covering when you take a bath or shower. Managing pain, stiffness, and swelling  If directed, put ice on the injured area. ? If you have a removable sling, remove it as told by your health care provider. ? Put ice in a plastic bag. ? Place a towel between your skin and the bag. ? Leave the ice on for 20 minutes, 2-3 times a day.  Move your fingers often to avoid stiffness and to lessen swelling.  Raise (elevate) the injured area above the level of your heart while you are lying down.  Find a comfortable sleeping position or sleep on a recliner, if available. Driving  Do not drive or use heavy machinery while taking prescription pain medicine.  Ask your health care provider when it is safe to drive if you have a sling on your arm. Activity  Rest your shoulder as told by your health care provider.  Return to your normal activities as told by your health care provider. Ask your health care provider what activities are safe for you.  Do any   exercises or stretches as told by your health care provider.  If you do repetitive overhead tasks, take small breaks in between and include stretching exercises as told by your health care provider. General instructions  Do not use any products that contain nicotine or tobacco, such as cigarettes and e-cigarettes. These can delay healing. If you need help quitting, ask your health care provider.  Take over-the-counter and prescription medicines only as told by  your health care provider.  Keep all follow-up visits as told by your health care provider. This is important. Contact a health care provider if:  Your pain gets worse.  You have new pain in your arm, hands, or fingers.  Your pain is not relieved with medicine or does not get better after 6 weeks of treatment.  You have cracking sensations when moving your shoulder in certain directions.  You hear a snapping sound after using your shoulder, followed by severe pain and weakness. Get help right away if:  Your arm, hand, or fingers are numb or tingling.  Your arm, hand, or fingers are swollen or painful or they turn white or blue. Summary  Rotator cuff tendinitis is inflammation of the tough, cord-like bands that connect muscle to bone (tendons) in the rotator cuff.  This condition is usually caused by overusing the rotator cuff, which includes all of the muscles and tendons that connect the arm to the shoulder.  This condition is more likely to develop in athletes and workers who frequently use their shoulder or reach over their heads.  Treatment generally includes rest, anti-inflammatory medicines, and icing. In some cases, physical therapy and steroid injections may be needed. In severe cases, surgery may be needed. This information is not intended to replace advice given to you by your health care provider. Make sure you discuss any questions you have with your health care provider. Document Released: 11/21/2003 Document Revised: 08/17/2016 Document Reviewed: 08/17/2016 Elsevier Interactive Patient Education  2017 [place].  Hypothyroidism Hypothyroidism is a disorder of the thyroid. The thyroid is a large gland that is located in the lower front of the neck. The thyroid releases hormones that control how the body works. With hypothyroidism, the thyroid does not make enough of these hormones. What are the causes? Causes of hypothyroidism may include:  Viral  infections.  Pregnancy.  Your own defense system (immune system) attacking your thyroid.  Certain medicines.  Birth defects.  Past radiation treatments to your head or neck.  Past treatment with radioactive iodine.  Past surgical removal of part or all of your thyroid.  Problems with the gland that is located in the center of your brain (pituitary).  What are the signs or symptoms? Signs and symptoms of hypothyroidism may include:  Feeling as though you have no energy (lethargy).  Inability to tolerate cold.  Weight gain that is not explained by a change in diet or exercise habits.  Dry skin.  Coarse hair.  Menstrual irregularity.  Slowing of thought processes.  Constipation.  Sadness or depression.  How is this diagnosed? Your health care provider may diagnose hypothyroidism with blood tests and ultrasound tests. How is this treated? Hypothyroidism is treated with medicine that replaces the hormones that your body does not make. After you begin treatment, it may take several weeks for symptoms to go away. Follow these instructions at home:  Take medicines only as directed by your health care provider.  If you start taking any new medicines, tell your health care provider.  Keep  all follow-up visits as directed by your health care provider. This is important. As your condition improves, your dosage needs may change. You will need to have blood tests regularly so that your health care provider can watch your condition. Contact a health care provider if:  Your symptoms do not get better with treatment.  You are taking thyroid replacement medicine and: ? You sweat excessively. ? You have tremors. ? You feel anxious. ? You lose weight rapidly. ? You cannot tolerate heat. ? You have emotional swings. ? You have diarrhea. ? You feel weak. Get help right away if:  You develop chest pain.  You develop an irregular heartbeat.  You develop a rapid  heartbeat. This information is not intended to replace advice given to you by your health care provider. Make sure you discuss any questions you have with your health care provider. Document Released: 08/31/2005 Document Revised: 02/06/2016 Document Reviewed: 01/16/2014 Elsevier Interactive Patient Education  2018 [employer].

## 2018-06-16 LAB — THYROID PANEL WITH TSH
FREE THYROXINE INDEX: 1.8 (ref 1.2–4.9)
T3 UPTAKE RATIO: 24 % (ref 24–39)
T4 TOTAL: 7.7 ug/dL (ref 4.5–12.0)
TSH: 2.87 u[IU]/mL (ref 0.450–4.500)

## 2018-06-16 LAB — THYROID PEROXIDASE ANTIBODY: THYROID PEROXIDASE ANTIBODY: 12 [IU]/mL (ref 0–34)

## 2018-06-16 LAB — THYROGLOBULIN ANTIBODY: Thyroglobulin Antibody: <1 IU/mL (ref 0.0–0.9)

## 2018-06-24 DIAGNOSIS — Z1231 Encounter for screening mammogram for malignant neoplasm of breast: Secondary | ICD-10-CM | POA: Diagnosis not present

## 2018-06-24 LAB — HM MAMMOGRAPHY

## 2018-08-09 ENCOUNTER — Ambulatory Visit: Payer: Medicare Other | Admitting: "Endocrinology

## 2018-10-26 DIAGNOSIS — H25812 Combined forms of age-related cataract, left eye: Secondary | ICD-10-CM | POA: Diagnosis not present

## 2018-10-26 DIAGNOSIS — Z01818 Encounter for other preprocedural examination: Secondary | ICD-10-CM | POA: Diagnosis not present

## 2018-10-26 DIAGNOSIS — H2512 Age-related nuclear cataract, left eye: Secondary | ICD-10-CM | POA: Diagnosis not present

## 2018-12-02 ENCOUNTER — Other Ambulatory Visit: Payer: Self-pay | Admitting: *Deleted

## 2018-12-02 MED ORDER — POTASSIUM CHLORIDE CRYS ER 20 MEQ PO TBCR: EXTENDED_RELEASE_TABLET | ORAL | 0 refills | Status: DC

## 2019-02-02 ENCOUNTER — Other Ambulatory Visit: Payer: Self-pay | Admitting: *Deleted

## 2019-02-02 MED ORDER — FUROSEMIDE 20 MG PO TABS: 20.0000 mg | ORAL_TABLET | Freq: Every day | ORAL | 0 refills | Status: DC

## 2019-02-02 MED ORDER — ATENOLOL 25 MG PO TABS: 25.0000 mg | ORAL_TABLET | Freq: Every day | ORAL | 0 refills | Status: DC

## 2019-03-01 ENCOUNTER — Other Ambulatory Visit: Payer: Self-pay | Admitting: Family Medicine

## 2019-03-15 ENCOUNTER — Telehealth: Payer: Self-pay

## 2019-03-15 NOTE — Telephone Encounter (Signed)
Called and left a msg stating we need to know if still taking praluent and to call us back for pa purposes

## 2019-05-04 ENCOUNTER — Other Ambulatory Visit: Payer: Self-pay | Admitting: Family Medicine

## 2019-05-18 DIAGNOSIS — M25562 Pain in left knee: Secondary | ICD-10-CM | POA: Diagnosis not present

## 2019-05-18 DIAGNOSIS — M1711 Unilateral primary osteoarthritis, right knee: Secondary | ICD-10-CM | POA: Diagnosis not present

## 2019-05-18 DIAGNOSIS — M25561 Pain in right knee: Secondary | ICD-10-CM | POA: Diagnosis not present

## 2019-05-29 DIAGNOSIS — M1711 Unilateral primary osteoarthritis, right knee: Secondary | ICD-10-CM | POA: Diagnosis not present

## 2019-06-01 ENCOUNTER — Other Ambulatory Visit: Payer: Self-pay | Admitting: Family Medicine

## 2019-06-01 NOTE — Telephone Encounter (Signed)
Patient needs OV.  Please schedule her and have her fast for labs.

## 2019-06-09 DIAGNOSIS — M1711 Unilateral primary osteoarthritis, right knee: Secondary | ICD-10-CM | POA: Diagnosis not present

## 2019-07-07 DIAGNOSIS — M1711 Unilateral primary osteoarthritis, right knee: Secondary | ICD-10-CM | POA: Diagnosis not present

## 2019-07-07 DIAGNOSIS — M25561 Pain in right knee: Secondary | ICD-10-CM | POA: Diagnosis not present

## 2019-07-25 DIAGNOSIS — M23321 Other meniscus derangements, posterior horn of medial meniscus, right knee: Secondary | ICD-10-CM | POA: Diagnosis not present

## 2019-07-25 DIAGNOSIS — M958 Other specified acquired deformities of musculoskeletal system: Secondary | ICD-10-CM | POA: Diagnosis not present

## 2019-08-07 ENCOUNTER — Other Ambulatory Visit: Payer: Self-pay | Admitting: Family Medicine

## 2019-08-07 ENCOUNTER — Telehealth: Payer: Self-pay | Admitting: Family Medicine

## 2019-08-07 NOTE — Telephone Encounter (Signed)
SENT IN PATIENT AWARE

## 2019-08-21 DIAGNOSIS — S83249A Other tear of medial meniscus, current injury, unspecified knee, initial encounter: Secondary | ICD-10-CM | POA: Insufficient documentation

## 2019-08-22 DIAGNOSIS — H04123 Dry eye syndrome of bilateral lacrimal glands: Secondary | ICD-10-CM | POA: Diagnosis not present

## 2019-08-24 ENCOUNTER — Other Ambulatory Visit: Payer: Self-pay

## 2019-08-25 ENCOUNTER — Encounter: Payer: Self-pay | Admitting: Family Medicine

## 2019-08-25 ENCOUNTER — Ambulatory Visit (INDEPENDENT_AMBULATORY_CARE_PROVIDER_SITE_OTHER): Payer: Medicare Other | Admitting: Family Medicine

## 2019-08-25 VITALS — BP 134/70 | HR 57 | Temp 97.3°F | Ht 63.0 in | Wt 204.0 lb

## 2019-08-25 DIAGNOSIS — M1A39X0 Chronic gout due to renal impairment, multiple sites, without tophus (tophi): Secondary | ICD-10-CM | POA: Diagnosis not present

## 2019-08-25 DIAGNOSIS — E785 Hyperlipidemia, unspecified: Secondary | ICD-10-CM | POA: Diagnosis not present

## 2019-08-25 DIAGNOSIS — R7309 Other abnormal glucose: Secondary | ICD-10-CM | POA: Diagnosis not present

## 2019-08-25 DIAGNOSIS — N1831 Chronic kidney disease, stage 3a: Secondary | ICD-10-CM | POA: Insufficient documentation

## 2019-08-25 DIAGNOSIS — Z1159 Encounter for screening for other viral diseases: Secondary | ICD-10-CM

## 2019-08-25 DIAGNOSIS — I10 Essential (primary) hypertension: Secondary | ICD-10-CM

## 2019-08-25 DIAGNOSIS — R7989 Other specified abnormal findings of blood chemistry: Secondary | ICD-10-CM

## 2019-08-25 DIAGNOSIS — R1011 Right upper quadrant pain: Secondary | ICD-10-CM

## 2019-08-25 LAB — BAYER DCA HB A1C WAIVED: HB A1C (BAYER DCA - WAIVED): 6.5 % (ref <7.0)

## 2019-08-25 MED ORDER — ALLOPURINOL 100 MG PO TABS: ORAL_TABLET | ORAL | 3 refills | Status: DC

## 2019-08-25 MED ORDER — COLCHICINE 0.6 MG PO TABS: ORAL_TABLET | ORAL | 1 refills | Status: DC

## 2019-08-25 MED ORDER — FUROSEMIDE 20 MG PO TABS: ORAL_TABLET | ORAL | 3 refills | Status: DC

## 2019-08-25 MED ORDER — POTASSIUM CHLORIDE CRYS ER 20 MEQ PO TBCR: 20.0000 meq | EXTENDED_RELEASE_TABLET | Freq: Every day | ORAL | 3 refills | Status: DC

## 2019-08-25 MED ORDER — ATENOLOL 25 MG PO TABS: ORAL_TABLET | ORAL | 3 refills | Status: DC

## 2019-08-25 NOTE — Patient Instructions (Signed)
Get your colonoscopy scheduled.  Let your GI doctor know if you are still tender along the right side of the belly.  If you change your mind about imaging,let me know.  You had labs performed today.  You will be contacted with the results of the labs once they are available, usually in the next 3 business days for routine lab work.  If you have an active my chart account, they will be released to your MyChart.  If you prefer to have these labs released to you via telephone, please let us know.  If you had a pap smear or biopsy performed, expect to be contacted in about 7-10 days.  Low-Purine Eating Plan A low-purine eating plan involves making food choices to limit your intake of purine. Purine is a kind of uric acid. Too much uric acid in your blood can cause certain conditions, such as gout and kidney stones. Eating a low-purine diet can help control these conditions. What are tips for following this plan? Reading food labels   Avoid foods with saturated or Trans fat.  Check the ingredient list of grains-based foods, such as bread and cereal, to make sure that they contain whole grains.  Check the ingredient list of sauces or soups to make sure they do not contain meat or fish.  When choosing soft drinks, check the ingredient list to make sure they do not contain high-fructose corn syrup. Shopping  Buy plenty of fresh fruits and vegetables.  Avoid buying canned or fresh fish.  Buy dairy products labeled as low-fat or nonfat.  Avoid buying premade or processed foods. These foods are often high in fat, salt (sodium), and added sugar. Cooking  Use olive oil instead of butter when cooking. Oils like olive oil, canola oil, and sunflower oil contain healthy fats. Meal planning  Learn which foods do or do not affect you. If you find out that a food tends to cause your gout symptoms to flare up, avoid eating that food. You can enjoy foods that do not cause problems. If you have any  questions about a food item, talk with your dietitian or health care provider.  Limit foods high in fat, especially saturated fat. Fat makes it harder for your body to get rid of uric acid.  Choose foods that are lower in fat and are lean sources of protein. General guidelines  Limit alcohol intake to no more than 1 drink a day for nonpregnant women and 2 drinks a day for men. One drink equals 12 oz of beer, 5 oz of wine, or 1 oz of hard liquor. Alcohol can affect the way your body gets rid of uric acid.  Drink plenty of water to keep your urine clear or pale yellow. Fluids can help remove uric acid from your body.  If directed by your health care provider, take a vitamin C supplement.  Work with your health care provider and dietitian to develop a plan to achieve or maintain a healthy weight. Losing weight can help reduce uric acid in your blood. What foods are recommended? The items listed may not be a complete list. Talk with your dietitian about what dietary choices are best for you. Foods low in purines Foods low in purines do not need to be limited. These include:  All fruits.  All low-purine vegetables, pickles, and olives.  Breads, pasta, rice, cornbread, and popcorn. Cake and other baked goods.  All dairy foods.  Eggs, nuts, and nut butters.  Spices and condiments,  such as salt, herbs, and vinegar.  Plant oils, butter, and margarine.  Water, sugar-free soft drinks, tea, coffee, and cocoa.  Vegetable-based soups, broths, sauces, and gravies. Foods moderate in purines Foods moderate in purines should be limited to the amounts listed.   cup of asparagus, cauliflower, spinach, mushrooms, or green peas, each day.  2/3 cup uncooked oatmeal, each day.   cup dry wheat bran or wheat germ, each day.  2-3 ounces of meat or poultry, each day.  4-6 ounces of shellfish, such as crab, lobster, oysters, or shrimp, each day.  1 cup cooked beans, peas, or lentils, each  day.  Soup, broths, or bouillon made from meat or fish. Limit these foods as much as possible. What foods are not recommended? The items listed may not be a complete list. Talk with your dietitian about what dietary choices are best for you. Limit your intake of foods high in purines, including:  Beer and other alcohol.  Meat-based gravy or sauce.  Canned or fresh fish, such as: ? Anchovies, sardines, herring, and tuna. ? Mussels and scallops. ? Codfish, trout, and haddock.  Berniece Salines.  Organ meats, such as: ? Liver or kidney. ? Tripe. ? Sweetbreads (thymus gland or pancreas).  Wild Clinical biochemist.  Yeast or yeast extract supplements.  Drinks sweetened with high-fructose corn syrup. Summary  Eating a low-purine diet can help control conditions caused by too much uric acid in the body, such as gout or kidney stones.  Choose low-purine foods, limit alcohol, and limit foods high in fat.  You will learn over time which foods do or do not affect you. If you find out that a food tends to cause your gout symptoms to flare up, avoid eating that food. This information is not intended to replace advice given to you by your health care provider. Make sure you discuss any questions you have with your health care provider. Document Released: 12/26/2010 Document Revised: 08/13/2017 Document Reviewed: 10/14/2016 Elsevier Patient Education  2020 [employer].

## 2019-08-25 NOTE — Progress Notes (Signed)
Subjective: CC: Gout, hypertension PCP: Janora Norlander, DO [id number] Kristie Lewis is a 68 y.o. female presenting to clinic today for:  1.  Gout Patient here for interval checkup.  She was last seen in 2019.  She notes ongoing intermittent episodes of gout flares in various joints but specifically feet, finger on the right hand and knees.  She is status post arthroscopy on the right knee about a month ago.  She had a flare in her foot a couple of days following.  She does use colchicine intermittently for these flares which does seem to relieve but she would like to go on something preventative given frequent flares.  She has a medical history of chronic kidney disease, stage III.  2.  Hypertension with hyperlipidemia Patient takes atenolol 25 mg and Lasix 20 mg daily she also takes potassium 20 mEq daily.  She has been intolerant to statins in the past and does not want to take these despite hyperlipidemia.  She is on over-the-counter fish oils which she tolerates without difficulty.  She does not think that she would be willing to go on a prescription version.  Denies any chest pain, shortness of breath, change in physical activity tolerance, lower extremity edema, dizziness or falls.  Additionally, she asks for her magnesium level to be checked today.   ROS: Per HPI  No Known Allergies Past Medical History:  Diagnosis Date  . Arthritis   . Cataract   . GERD (gastroesophageal reflux disease)   . Gout   . Hyperlipidemia   . Hypertension   . Microscopic colitis   . Renal cancer ([place]) 2010    Current Outpatient Medications:  .  aspirin EC 81 MG tablet, Take 81 mg by mouth 2 (two) times daily., Disp: , Rfl:  .  atenolol (TENORMIN) 25 MG tablet, TAKE ONE (1) TABLET EACH DAY, Disp: 90 tablet, Rfl: 0 .  B Complex Vitamins (B-COMPLEX/B-12 PO), Take by mouth., Disp: , Rfl:  .  budesonide (ENTOCORT EC) 3 MG 24 hr capsule, Take by mouth as needed. , Disp: , Rfl:  .  co-enzyme Q-10 50 MG  capsule, Take 50 mg by mouth daily., Disp: , Rfl:  .  colchicine (COLCRYS) 0.6 MG tablet, TAKE 2 TABS AT ONSET OF ATTACK, THEN TAKE 1 TAB EVERY 2 HOURS AS NEEDED UP TO 6 TABLETS TOTAL THEN TAKE TWICE DAILY UNTIL SYMPTOMS CLEAR, Disp: 30 tablet, Rfl: 1 .  furosemide (LASIX) 20 MG tablet, TAKE ONE (1) TABLET EACH DAY, Disp: 90 tablet, Rfl: 0 .  gabapentin (NEURONTIN) 300 MG capsule, Take 1 capsule (300 mg total) by mouth at bedtime., Disp: 30 capsule, Rfl: 2 .  KRILL OIL 1000 MG CAPS, Take by mouth., Disp: , Rfl:  .  L-Lysine 1000 MG TABS, Take by mouth., Disp: , Rfl:  .  Misc Natural Products (OSTEO BI-FLEX ADV DOUBLE ST PO), Take 2 capsules by mouth daily. , Disp: , Rfl:  .  potassium chloride SA (K-DUR) 20 MEQ tablet, TAKE ONE (1) TABLET EACH DAY, Disp: 90 tablet, Rfl: 0 .  UNABLE TO FIND, Always eye drops, Disp: , Rfl:  Social History   Socioeconomic History  . Marital status: Married    Spouse name: Not on file  . Number of children: Not on file  . Years of education: Not on file  . Highest education level: Not on file  Occupational History  . Not on file  Tobacco Use  . Smoking status: Never Smoker  .  Smokeless tobacco: Never Used  Substance and Sexual Activity  . Alcohol use: No  . Drug use: No  . Sexual activity: Not on file  Other Topics Concern  . Not on file  Social History Narrative  . Not on file   Social Determinants of Health   Financial Resource Strain:   . Difficulty of Paying Living Expenses: Not on file  Food Insecurity:   . Worried About Charity fundraiser in the Last Year: Not on file  . Ran Out of Food in the Last Year: Not on file  Transportation Needs:   . Lack of Transportation (Medical): Not on file  . Lack of Transportation (Non-Medical): Not on file  Physical Activity:   . Days of Exercise per Week: Not on file  . Minutes of Exercise per Session: Not on file  Stress:   . Feeling of Stress : Not on file  Social Connections:   . Frequency of  Communication with Friends and Family: Not on file  . Frequency of Social Gatherings with Friends and Family: Not on file  . Attends Religious Services: Not on file  . Active Member of Clubs or Organizations: Not on file  . Attends Archivist Meetings: Not on file  . Marital Status: Not on file  Intimate Partner Violence:   . Fear of Current or Ex-Partner: Not on file  . Emotionally Abused: Not on file  . Physically Abused: Not on file  . Sexually Abused: Not on file   Family History  Problem Relation Age of Onset  . Hyperlipidemia Mother   . Stroke Mother   . Diabetes Father   . Heart disease Father   . Hypertension Father   . Stroke Father   . Colon cancer Neg Hx   . Esophageal cancer Neg Hx   . Stomach cancer Neg Hx   . Rectal cancer Neg Hx     Objective: Office vital signs reviewed. BP 134/70   Pulse (!) 57   Temp (!) 97.3 F (36.3 C) (Temporal)   Ht '5\' 3"'  (1.6 m)   Wt 204 lb (92.5 kg)   SpO2 98%   BMI 36.14 kg/m   Physical Examination:  General: Awake, alert, well nourished, obese.  No acute distress HEENT: Normal    Neck: No masses palpated. No lymphadenopathy Cardio: Slightly bradycardic with regular rhythm, S1S2 heard, no murmurs appreciated Pulm: clear to auscultation bilaterally, no wheezes, rhonchi or rales; normal work of breathing on room air GI: obese, diastases recti noted.  Soft, +TTP to RUQ.  No guarding.  Non-distended, bowel sounds present x4, liver edge is palpable.  No palpable abdominal masses. Extremities: warm, well perfused, No edema, cyanosis or clubbing; +2 pulses bilaterally  Assessment/ Plan: 68 y.o. female   1. Essential hypertension Controlled with atenolol.  Given CKD 3, could consider adding ACE inhibitor or ARB.  I will discuss this with further with patient at a future visit. - CMP14+EGFR - atenolol (TENORMIN) 25 MG tablet; TAKE ONE (1) TABLET EACH DAY  Dispense: 90 tablet; Refill: 3 - furosemide (LASIX) 20 MG tablet;  TAKE ONE (1) TABLET EACH DAY  Dispense: 90 tablet; Refill: 3 - potassium chloride SA (KLOR-CON) 20 MEQ tablet; Take 1 tablet (20 mEq total) by mouth daily.  Dispense: 90 tablet; Refill: 3 - Magnesium  2. Stage 3a chronic kidney disease - CMP14+EGFR - Magnesium  3. Chronic gout due to renal impairment of multiple sites without tophus Likely related to renal impairment.  We discussed diet modification and a handout was provided.  We will slowly titrate her onto allopurinol for prevention of gout.  Check uric acid level. - CBC - Uric acid - allopurinol (ZYLOPRIM) 100 MG tablet; Take 0.5 tablets (50 mg total) by mouth daily for 28 days, THEN 1 tablet (100 mg total) daily.  Dispense: 90 tablet; Refill: 3 - colchicine (COLCRYS) 0.6 MG tablet; TAKE 2 TABS AT ONSET OF ATTACK, THEN TAKE 1 TAB EVERY 2 HOURS AS NEEDED UP TO 6 TABLETS TOTAL THEN TAKE TWICE DAILY UNTIL SYMPTOMS CLEAR  Dispense: 30 tablet; Refill: 1  4. Hyperlipidemia, unspecified hyperlipidemia type Check fasting lipid panel. - CMP14+EGFR - Lipid Panel - TSH  5. Elevated TSH - TSH  6. Abnormal glucose A1c noted to be 6.5 today.  She had an A1c of 6.6 in October 2019.  This is technically type 2 diabetes that is diet controlled. - Bayer DCA Hb A1c Waived  7. Morbid obesity ([place]) - TSH - Bayer DCA Hb A1c Waived  8. Encounter for hepatitis C screening test for low risk patient - HCV Antibody RFX to Quant PCR  9. RUQ pain Poss related to history of gallbladder pathology.  She did not wish to pursue eval for this but just wanted to bring it to my attention.  She will follow-up with her gastroenterologist for further monitoring.  She has a colonoscopy scheduled for sometime next year.   Orders Placed This Encounter  Procedures  . CMP14+EGFR  . Lipid Panel  . TSH  . Bayer DCA Hb A1c Waived  . CBC  . Uric acid  . HCV Antibody RFX to Quant PCR   Meds ordered this encounter  Medications  . allopurinol (ZYLOPRIM) 100 MG  tablet    Sig: Take 0.5 tablets (50 mg total) by mouth daily for 28 days, THEN 1 tablet (100 mg total) daily.    Dispense:  90 tablet    Refill:  3  . atenolol (TENORMIN) 25 MG tablet    Sig: TAKE ONE (1) TABLET EACH DAY    Dispense:  90 tablet    Refill:  3  . colchicine (COLCRYS) 0.6 MG tablet    Sig: TAKE 2 TABS AT ONSET OF ATTACK, THEN TAKE 1 TAB EVERY 2 HOURS AS NEEDED UP TO 6 TABLETS TOTAL THEN TAKE TWICE DAILY UNTIL SYMPTOMS CLEAR    Dispense:  30 tablet    Refill:  1  . furosemide (LASIX) 20 MG tablet    Sig: TAKE ONE (1) TABLET EACH DAY    Dispense:  90 tablet    Refill:  3  . potassium chloride SA (KLOR-CON) 20 MEQ tablet    Sig: Take 1 tablet (20 mEq total) by mouth daily.    Dispense:  90 tablet    Refill:  [place], [place] [phone]

## 2019-08-26 LAB — CMP14+EGFR
ALT: 45 IU/L — ABNORMAL HIGH (ref 0–32)
AST: 35 IU/L (ref 0–40)
Albumin/Globulin Ratio: 1.5 (ref 1.2–2.2)
Albumin: 4.1 g/dL (ref 3.8–4.8)
Alkaline Phosphatase: 72 IU/L (ref 39–117)
BUN/Creatinine Ratio: 16 (ref 12–28)
BUN: 16 mg/dL (ref 8–27)
Bilirubin Total: 0.5 mg/dL (ref 0.0–1.2)
CO2: 22 mmol/L (ref 20–29)
Calcium: 9.8 mg/dL (ref 8.7–10.3)
Chloride: 105 mmol/L (ref 96–106)
Creatinine, Ser: 0.99 mg/dL (ref 0.57–1.00)
GFR calc Af Amer: 68 mL/min/{1.73_m2} (ref >59)
GFR calc non Af Amer: 59 mL/min/{1.73_m2} — ABNORMAL LOW (ref >59)
Globulin, Total: 2.7 g/dL (ref 1.5–4.5)
Glucose: 146 mg/dL — ABNORMAL HIGH (ref 65–99)
Potassium: 4.3 mmol/L (ref 3.5–5.2)
Sodium: 140 mmol/L (ref 134–144)
Total Protein: 6.8 g/dL (ref 6.0–8.5)

## 2019-08-26 LAB — LIPID PANEL
Chol/HDL Ratio: 6.8 ratio — ABNORMAL HIGH (ref 0.0–4.4)
Cholesterol, Total: 270 mg/dL — ABNORMAL HIGH (ref 100–199)
HDL: 40 mg/dL (ref >39)
LDL Chol Calc (NIH): 184 mg/dL — ABNORMAL HIGH (ref 0–99)
Triglycerides: 240 mg/dL — ABNORMAL HIGH (ref 0–149)
VLDL Cholesterol Cal: 46 mg/dL — ABNORMAL HIGH (ref 5–40)

## 2019-08-26 LAB — TSH: TSH: 4.1 u[IU]/mL (ref 0.450–4.500)

## 2019-08-26 LAB — CBC
Hematocrit: 40 % (ref 34.0–46.6)
Hemoglobin: 13.3 g/dL (ref 11.1–15.9)
MCH: 31.7 pg (ref 26.6–33.0)
MCHC: 33.3 g/dL (ref 31.5–35.7)
MCV: 95 fL (ref 79–97)
Platelets: 257 10*3/uL (ref 150–450)
RBC: 4.2 x10E6/uL (ref 3.77–5.28)
RDW: 13.2 % (ref 11.7–15.4)
WBC: 5.5 10*3/uL (ref 3.4–10.8)

## 2019-08-26 LAB — URIC ACID: Uric Acid: 8.1 mg/dL — ABNORMAL HIGH (ref 3.0–7.2)

## 2019-08-26 LAB — HEPATITIS C ANTIBODY: Hep C Virus Ab: <0.1 s/co ratio (ref 0.0–0.9)

## 2019-08-27 LAB — SPECIMEN STATUS REPORT

## 2019-08-27 LAB — MAGNESIUM: Magnesium: 2 mg/dL (ref 1.6–2.3)

## 2019-10-03 ENCOUNTER — Ambulatory Visit (INDEPENDENT_AMBULATORY_CARE_PROVIDER_SITE_OTHER): Payer: Medicare Other | Admitting: Family Medicine

## 2019-10-03 DIAGNOSIS — M10071 Idiopathic gout, right ankle and foot: Secondary | ICD-10-CM

## 2019-10-03 MED ORDER — PREDNISONE 10 MG PO TABS: ORAL_TABLET | ORAL | 0 refills | Status: DC

## 2019-10-03 NOTE — Progress Notes (Signed)
Telephone visit  Subjective: CC: Gout PCP: Janora Norlander, DO [id number] Kristie Lewis is a 69 y.o. female calls for telephone consult today. Patient provides verbal consent for consult held via phone.  Due to COVID-19 pandemic this visit was conducted virtually. This visit type was conducted due to national recommendations for restrictions regarding the COVID-19 Pandemic (e.g. social distancing, sheltering in place) in an effort to limit this patient's exposure and mitigate transmission in our community. All issues noted in this document were discussed and addressed.  A physical exam was not performed with this format.   Location of patient: Home Location of provider: Working remotely from home Others present for call: None  1.  Gout Patient reports that she had a severe case of gout in the arch of her right foot at the beginning of the month.  She took colchicine pretty regularly and this ultimately did resolve.  Of note, she notes that she is up to 100 mg of allopurinol daily.  She did not notice the gout coinciding with any dose adjustments.  However, she notes that today she started having a another flare of the gout in the same area and is wondering what she should do.  She is already taken a couple doses of colchicine but has not noticed that it is changing much.  It certainly not as severe as the first of the month but is present.   ROS: Per HPI  No Known Allergies Past Medical History:  Diagnosis Date  . Arthritis   . Cataract   . GERD (gastroesophageal reflux disease)   . Gout   . Hyperlipidemia   . Hypertension   . Microscopic colitis   . Renal cancer ([place]) 2010    Current Outpatient Medications:  .  allopurinol (ZYLOPRIM) 100 MG tablet, Take 0.5 tablets (50 mg total) by mouth daily for 28 days, THEN 1 tablet (100 mg total) daily., Disp: 90 tablet, Rfl: 3 .  aspirin EC 81 MG tablet, Take 81 mg by mouth 2 (two) times daily., Disp: , Rfl:  .  atenolol (TENORMIN) 25 MG  tablet, TAKE ONE (1) TABLET EACH DAY, Disp: 90 tablet, Rfl: 3 .  B Complex Vitamins (B-COMPLEX/B-12 PO), Take by mouth., Disp: , Rfl:  .  budesonide (ENTOCORT EC) 3 MG 24 hr capsule, Take by mouth as needed. , Disp: , Rfl:  .  co-enzyme Q-10 50 MG capsule, Take 50 mg by mouth daily., Disp: , Rfl:  .  colchicine (COLCRYS) 0.6 MG tablet, TAKE 2 TABS AT ONSET OF ATTACK, THEN TAKE 1 TAB EVERY 2 HOURS AS NEEDED UP TO 6 TABLETS TOTAL THEN TAKE TWICE DAILY UNTIL SYMPTOMS CLEAR, Disp: 30 tablet, Rfl: 1 .  furosemide (LASIX) 20 MG tablet, TAKE ONE (1) TABLET EACH DAY, Disp: 90 tablet, Rfl: 3 .  gabapentin (NEURONTIN) 300 MG capsule, Take 1 capsule (300 mg total) by mouth at bedtime., Disp: 30 capsule, Rfl: 2 .  KRILL OIL 1000 MG CAPS, Take by mouth., Disp: , Rfl:  .  L-Lysine 1000 MG TABS, Take by mouth., Disp: , Rfl:  .  Misc Natural Products (OSTEO BI-FLEX ADV DOUBLE ST PO), Take 2 capsules by mouth daily. , Disp: , Rfl:  .  potassium chloride SA (KLOR-CON) 20 MEQ tablet, Take 1 tablet (20 mEq total) by mouth daily., Disp: 90 tablet, Rfl: 3 .  UNABLE TO FIND, Always eye drops, Disp: , Rfl:   Assessment/ Plan: 69 y.o. female   1. Acute idiopathic gout of right  foot I have instructed her to discontinue colchicine since she is having recurrent flares.  We will instead treat her with a prednisone taper.  I reviewed the instructions with the patient.  She will continue allopurinol 100 mg daily.  Plan for increase to 150 mg daily at least 2 weeks after current flare resolved.  We discussed the possibility that we may need to consider an alternative to allopurinol.  However, she just may not be at therapeutic dosing yet.  Her uric acid level was 8.1 during her last visit.  Ideally would like to get this below 6.  Plan for repeat uric acid level in a month or so.  We will plan to titrate allopurinol to uric acid. - predniSONE (DELTASONE) 10 MG tablet; Take 60mg  by mouth day 1-2, 50mg  day 3-4, 40mg  day 5-6, 30mg   day 7-8, 20mg  day 9-10, 10mg  day 11-12.  Then stop.  Dispense: 42 tablet; Refill: 0   Start time: 9:47am End time: 9:54am  Total time spent on patient care (including telephone call/ virtual visit): 20 minutes  [place], [place] [phone]

## 2019-10-04 ENCOUNTER — Telehealth: Payer: Self-pay | Admitting: Family Medicine

## 2019-10-04 NOTE — Telephone Encounter (Signed)
Unsure as to why she was taking 2 tablets separately.  The directions were for her to take 6 tablets as a single dose.  The later that she takes the prednisone, the more likely she will have difficulty with sleeping.  I recommend that she actually take each of these doses with breakfast.  She may certainly reduce the dose and just go to 4 tablets daily for 1 week if she prefers.  The remainder she can just put in her cupboard if her foot is already improving.

## 2019-10-04 NOTE — Telephone Encounter (Signed)
Pt aware of provider feedback and voiced understanding and says she will just go ahead and take the 4 tabs daily for one week and will take first thing in the morning.

## 2019-10-04 NOTE — Telephone Encounter (Signed)
Patient called stating that she started taking Prednisone 60mg  for 2 days and says she took it yesterday and says her head and jaws have really been hurting since taking it. Wants to know if 60mg  is too much for her to start with.   Pt says ok to call at home or cell phone.

## 2019-10-04 NOTE — Telephone Encounter (Signed)
Spoke with pt and she picked up the Prednisone yesterday afternoon and took 2 as soon as she got it then 2 a few hours later and then another 2 to equal the 60 mg. Pt states she didn't sleep well last night and now has a headache and her jaws are sore. She took her BP while on the phone with me and the reading was 150/80. She says her BP is usually low 100's over 70-80--HR is 88.Pt denies being shaky/jittery and states her foot is already improving.Pt states she has taken Prednisone in the past but it was many years ago. Pt wants to know if she should lower the dose?

## 2019-10-14 ENCOUNTER — Encounter: Payer: Self-pay | Admitting: Family Medicine

## 2020-01-22 DIAGNOSIS — H16142 Punctate keratitis, left eye: Secondary | ICD-10-CM | POA: Diagnosis not present

## 2020-02-15 DIAGNOSIS — H04123 Dry eye syndrome of bilateral lacrimal glands: Secondary | ICD-10-CM | POA: Diagnosis not present

## 2020-03-24 ENCOUNTER — Encounter: Payer: Self-pay | Admitting: Family Medicine

## 2020-03-25 ENCOUNTER — Other Ambulatory Visit: Payer: Self-pay | Admitting: Family Medicine

## 2020-03-25 ENCOUNTER — Encounter: Payer: Self-pay | Admitting: Family Medicine

## 2020-03-25 DIAGNOSIS — M1A39X0 Chronic gout due to renal impairment, multiple sites, without tophus (tophi): Secondary | ICD-10-CM

## 2020-03-25 DIAGNOSIS — N1831 Chronic kidney disease, stage 3a: Secondary | ICD-10-CM

## 2020-03-25 DIAGNOSIS — R7309 Other abnormal glucose: Secondary | ICD-10-CM

## 2020-04-01 ENCOUNTER — Other Ambulatory Visit: Payer: Self-pay

## 2020-04-01 ENCOUNTER — Other Ambulatory Visit: Payer: Medicare Other

## 2020-04-01 DIAGNOSIS — M1A39X0 Chronic gout due to renal impairment, multiple sites, without tophus (tophi): Secondary | ICD-10-CM | POA: Diagnosis not present

## 2020-04-01 DIAGNOSIS — R7309 Other abnormal glucose: Secondary | ICD-10-CM | POA: Diagnosis not present

## 2020-04-01 DIAGNOSIS — N1831 Chronic kidney disease, stage 3a: Secondary | ICD-10-CM

## 2020-04-01 LAB — BAYER DCA HB A1C WAIVED: HB A1C (BAYER DCA - WAIVED): 9.2 % — ABNORMAL HIGH (ref <7.0)

## 2020-04-02 LAB — MICROALBUMIN / CREATININE URINE RATIO
Creatinine, Urine: 219.4 mg/dL
Microalb/Creat Ratio: 7 mg/g creat (ref 0–29)
Microalbumin, Urine: 15 ug/mL

## 2020-04-02 LAB — CMP14+EGFR
ALT: 73 IU/L — ABNORMAL HIGH (ref 0–32)
AST: 83 IU/L — ABNORMAL HIGH (ref 0–40)
Albumin/Globulin Ratio: 1.4 (ref 1.2–2.2)
Albumin: 3.9 g/dL (ref 3.8–4.8)
Alkaline Phosphatase: 82 IU/L (ref 48–121)
BUN/Creatinine Ratio: 12 (ref 12–28)
BUN: 11 mg/dL (ref 8–27)
Bilirubin Total: 0.4 mg/dL (ref 0.0–1.2)
CO2: 20 mmol/L (ref 20–29)
Calcium: 9.4 mg/dL (ref 8.7–10.3)
Chloride: 105 mmol/L (ref 96–106)
Creatinine, Ser: 0.9 mg/dL (ref 0.57–1.00)
GFR calc Af Amer: 75 mL/min/{1.73_m2} (ref >59)
GFR calc non Af Amer: 65 mL/min/{1.73_m2} (ref >59)
Globulin, Total: 2.7 g/dL (ref 1.5–4.5)
Glucose: 186 mg/dL — ABNORMAL HIGH (ref 65–99)
Potassium: 4.1 mmol/L (ref 3.5–5.2)
Sodium: 139 mmol/L (ref 134–144)
Total Protein: 6.6 g/dL (ref 6.0–8.5)

## 2020-04-02 LAB — LIPID PANEL
Chol/HDL Ratio: 7.2 ratio — ABNORMAL HIGH (ref 0.0–4.4)
Cholesterol, Total: 274 mg/dL — ABNORMAL HIGH (ref 100–199)
HDL: 38 mg/dL — ABNORMAL LOW (ref >39)
LDL Chol Calc (NIH): 202 mg/dL — ABNORMAL HIGH (ref 0–99)
Triglycerides: 179 mg/dL — ABNORMAL HIGH (ref 0–149)
VLDL Cholesterol Cal: 34 mg/dL (ref 5–40)

## 2020-04-02 LAB — URIC ACID: Uric Acid: 5.3 mg/dL (ref 3.0–7.2)

## 2020-04-02 LAB — TSH: TSH: 5.32 u[IU]/mL — ABNORMAL HIGH (ref 0.450–4.500)

## 2020-04-05 ENCOUNTER — Encounter: Payer: Self-pay | Admitting: Family Medicine

## 2020-04-05 ENCOUNTER — Ambulatory Visit (INDEPENDENT_AMBULATORY_CARE_PROVIDER_SITE_OTHER): Payer: Medicare Other | Admitting: Family Medicine

## 2020-04-05 ENCOUNTER — Other Ambulatory Visit: Payer: Self-pay

## 2020-04-05 ENCOUNTER — Ambulatory Visit (INDEPENDENT_AMBULATORY_CARE_PROVIDER_SITE_OTHER): Payer: Medicare Other

## 2020-04-05 VITALS — BP 99/60 | HR 73 | Temp 97.7°F | Ht 63.0 in | Wt 197.8 lb

## 2020-04-05 DIAGNOSIS — E119 Type 2 diabetes mellitus without complications: Secondary | ICD-10-CM | POA: Diagnosis not present

## 2020-04-05 DIAGNOSIS — E1169 Type 2 diabetes mellitus with other specified complication: Secondary | ICD-10-CM

## 2020-04-05 DIAGNOSIS — J90 Pleural effusion, not elsewhere classified: Secondary | ICD-10-CM | POA: Diagnosis not present

## 2020-04-05 DIAGNOSIS — R5383 Other fatigue: Secondary | ICD-10-CM

## 2020-04-05 DIAGNOSIS — Z85528 Personal history of other malignant neoplasm of kidney: Secondary | ICD-10-CM

## 2020-04-05 DIAGNOSIS — Z8051 Family history of malignant neoplasm of kidney: Secondary | ICD-10-CM | POA: Diagnosis not present

## 2020-04-05 DIAGNOSIS — R7989 Other specified abnormal findings of blood chemistry: Secondary | ICD-10-CM | POA: Diagnosis not present

## 2020-04-05 DIAGNOSIS — E785 Hyperlipidemia, unspecified: Secondary | ICD-10-CM

## 2020-04-05 NOTE — Progress Notes (Signed)
Subjective: CC: New onset DM, HTN, HLD PCP: Janora Norlander, DO CXK:GYJEH W Pierrelouis is a 69 y.o. female presenting to clinic today for:  1. New onset Type 2 Diabetes w/ HLD:  Patient reports that she has been more stressed lately.  She wonders if perhaps this is impacting sugar.  Does not report any sensory changes, blurred vision, polyuria or polydipsia.  She has a strong family history of type 2 diabetes in the family.  Her father unfortunately suffered cardiovascular disease as well.  She feels like she maintains a fairly high-fiber diet but does drink things like sweet tea.  She is very reluctant to take any cholesterol medicine or sugar medicine.  Last eye exam: Needs Last foot exam: Needs Last A1c:  Lab Results  Component Value Date   HGBA1C 9.2 (H) 04/01/2020   Nephropathy screen indicated?:  Needs Last flu, zoster and/or pneumovax:  There is no immunization history on file for this patient.  2. History of renal cell carcinoma Patient reports history of renal cell carcinoma status post treatment over a decade ago.  She really wants to get a chest x-ray as she has been worrying about possible metastases for a while.  She is not reporting thing specific to suggest that she is having any lung issues.  A chest x-ray would "just make her feel better".    ROS: Per HPI  No Known Allergies Past Medical History:  Diagnosis Date  . Arthritis   . Cataract   . GERD (gastroesophageal reflux disease)   . Gout   . Hyperlipidemia   . Hypertension   . Microscopic colitis   . Renal cancer ([place]) 2010    Current Outpatient Medications:  .  allopurinol (ZYLOPRIM) 100 MG tablet, Take 0.5 tablets (50 mg total) by mouth daily for 28 days, THEN 1 tablet (100 mg total) daily., Disp: 90 tablet, Rfl: 3 .  aspirin EC 81 MG tablet, Take 81 mg by mouth 2 (two) times daily., Disp: , Rfl:  .  atenolol (TENORMIN) 25 MG tablet, TAKE ONE (1) TABLET EACH DAY, Disp: 90 tablet, Rfl: 3 .  B Complex  Vitamins (B-COMPLEX/B-12 PO), Take by mouth., Disp: , Rfl:  .  budesonide (ENTOCORT EC) 3 MG 24 hr capsule, Take by mouth as needed. , Disp: , Rfl:  .  co-enzyme Q-10 50 MG capsule, Take 50 mg by mouth daily., Disp: , Rfl:  .  colchicine (COLCRYS) 0.6 MG tablet, TAKE 2 TABS AT ONSET OF ATTACK, THEN TAKE 1 TAB EVERY 2 HOURS AS NEEDED UP TO 6 TABLETS TOTAL THEN TAKE TWICE DAILY UNTIL SYMPTOMS CLEAR, Disp: 30 tablet, Rfl: 1 .  furosemide (LASIX) 20 MG tablet, TAKE ONE (1) TABLET EACH DAY, Disp: 90 tablet, Rfl: 3 .  gabapentin (NEURONTIN) 300 MG capsule, Take 1 capsule (300 mg total) by mouth at bedtime., Disp: 30 capsule, Rfl: 2 .  KRILL OIL 1000 MG CAPS, Take by mouth., Disp: , Rfl:  .  L-Lysine 1000 MG TABS, Take by mouth., Disp: , Rfl:  .  Misc Natural Products (OSTEO BI-FLEX ADV DOUBLE ST PO), Take 2 capsules by mouth daily. , Disp: , Rfl:  .  potassium chloride SA (KLOR-CON) 20 MEQ tablet, Take 1 tablet (20 mEq total) by mouth daily., Disp: 90 tablet, Rfl: 3 .  UNABLE TO FIND, Always eye drops, Disp: , Rfl:  Social History   Socioeconomic History  . Marital status: Married    Spouse name: Not on file  .  Number of children: Not on file  . Years of education: Not on file  . Highest education level: Not on file  Occupational History  . Not on file  Tobacco Use  . Smoking status: Never Smoker  . Smokeless tobacco: Never Used  Vaping Use  . Vaping Use: Never used  Substance and Sexual Activity  . Alcohol use: No  . Drug use: No  . Sexual activity: Not on file  Other Topics Concern  . Not on file  Social History Narrative  . Not on file   Social Determinants of Health   Financial Resource Strain:   . Difficulty of Paying Living Expenses:   Food Insecurity:   . Worried About Charity fundraiser in the Last Year:   . Arboriculturist in the Last Year:   Transportation Needs:   . Film/video editor (Medical):   Marland Kitchen Lack of Transportation (Non-Medical):   Physical Activity:   .  Days of Exercise per Week:   . Minutes of Exercise per Session:   Stress:   . Feeling of Stress :   Social Connections:   . Frequency of Communication with Friends and Family:   . Frequency of Social Gatherings with Friends and Family:   . Attends Religious Services:   . Active Member of Clubs or Organizations:   . Attends Archivist Meetings:   Marland Kitchen Marital Status:   Intimate Partner Violence:   . Fear of Current or Ex-Partner:   . Emotionally Abused:   Marland Kitchen Physically Abused:   . Sexually Abused:    Family History  Problem Relation Age of Onset  . Hyperlipidemia Mother   . Stroke Mother   . Diabetes Father   . Heart disease Father   . Hypertension Father   . Stroke Father   . Colon cancer Neg Hx   . Esophageal cancer Neg Hx   . Stomach cancer Neg Hx   . Rectal cancer Neg Hx     Objective: Office vital signs reviewed. BP (!) 99/60   Pulse 73   Temp 97.7 F (36.5 C)   Ht 5\' 3"  (1.6 m)   Wt 197 lb 12.8 oz (89.7 kg)   SpO2 97%   BMI 35.04 kg/m   Physical Examination:  General: Awake, alert, well nourished, No acute distress HEENT: Normal; no exophthalmos.  No goiter Cardio: regular rate and rhythm, S1S2 heard, no murmurs appreciated Pulm: clear to auscultation bilaterally, no wheezes, rhonchi or rales; normal work of breathing on room air Extremities: warm, well perfused, No edema, cyanosis or clubbing; +2 pulses bilaterally MSK: normal gait and station  Assessment/ Plan: 69 y.o. female   1. New onset of type 2 diabetes mellitus in pediatric patient [facility]) Very reluctant to pursue any type of medications.  She really wants to get strict about diet and exercise prior to starting any medicines.  We will plan to repeat her A1c, fasting lipid panel and liver function tests in 3 months.  Discussed the risks of untreated hyperlipidemia, type 2 diabetes.  She voiced good understanding of these risks.  2. Hyperlipidemia associated with type 2 diabetes mellitus  ([place]) Reluctant to start any cholesterol medicines.  Previously intolerant to statins.  3. Elevated liver function tests  4. Fatigue, unspecified type - DG Chest 2 View; Future  5. History of renal cell cancer Personal review of the chest x-ray demonstrated no abnormalities. - DG Chest 2 View; Future   No orders of the defined  types were placed in this encounter.  No orders of the defined types were placed in this encounter.    Janora Norlander, DO [place] [phone]

## 2020-04-05 NOTE — Patient Instructions (Addendum)
Get your annual eye exam.  Let them know that you are newly diagnosed diabetic. Schedule with julie for additional resources  Diabetes Mellitus and Standards of Medical Care Managing diabetes (diabetes mellitus) can be complicated. Your diabetes treatment may be managed by a team of health care providers, including:  A physician who specializes in diabetes (endocrinologist).  A nurse practitioner or physician assistant.  Nurses.  A diet and nutrition specialist (registered dietitian).  A certified diabetes educator (CDE).  An exercise specialist.  A pharmacist.  An eye doctor.  A foot specialist (podiatrist).  A dentist.  A primary care provider.  A mental health provider. Your health care providers follow guidelines to help you get the best quality of care. The following schedule is a general guideline for your diabetes management plan. Your health care providers may give you more specific instructions. Physical exams Upon being diagnosed with diabetes mellitus, and each year after that, your health care provider will ask about your medical and family history. He or she will also do a physical exam. Your exam may include:  Measuring your height, weight, and body mass index (BMI).  Checking your blood pressure. This will be done at every routine medical visit. Your target blood pressure may vary depending on your medical conditions, your age, and other factors.  Thyroid gland exam.  Skin exam.  Screening for damage to your nerves (peripheral neuropathy). This may include checking the pulse in your legs and feet and checking the level of sensation in your hands and feet.  A complete foot exam to inspect the structure and skin of your feet, including checking for cuts, bruises, redness, blisters, sores, or other problems.  Screening for blood vessel (vascular) problems, which may include checking the pulse in your legs and feet and checking your temperature. Blood  tests Depending on your treatment plan and your personal needs, you may have the following tests done:  HbA1c (hemoglobin A1c). This test provides information about blood sugar (glucose) control over the previous 2-3 months. It is used to adjust your treatment plan, if needed. This test will be done: ? At least 2 times a year, if you are meeting your treatment goals. ? 4 times a year, if you are not meeting your treatment goals or if treatment goals have changed.  Lipid testing, including total, LDL, and HDL cholesterol and triglyceride levels. ? The goal for LDL is less than 100 mg/dL (5.5 mmol/L). If you are at high risk for complications, the goal is less than 70 mg/dL (3.9 mmol/L). ? The goal for HDL is 40 mg/dL (2.2 mmol/L) or higher for men and 50 mg/dL (2.8 mmol/L) or higher for women. An HDL cholesterol of 60 mg/dL (3.3 mmol/L) or higher gives some protection against heart disease. ? The goal for triglycerides is less than 150 mg/dL (8.3 mmol/L).  Liver function tests.  Kidney function tests.  Thyroid function tests. Dental and eye exams  Visit your dentist two times a year.  If you have type 1 diabetes, your health care provider may recommend an eye exam 3-5 years after you are diagnosed, and then once a year after your first exam. ? For children with type 1 diabetes, a health care provider may recommend an eye exam when your child is age 41 or older and has had diabetes for 3-5 years. After the first exam, your child should get an eye exam once a year.  If you have type 2 diabetes, your health care provider may recommend  an eye exam as soon as you are diagnosed, and then once a year after your first exam. Immunizations   The yearly flu (influenza) vaccine is recommended for everyone 6 months or older who has diabetes.  The pneumonia (pneumococcal) vaccine is recommended for everyone 2 years or older who has diabetes. If you are 39 or older, you may get the pneumonia vaccine as  a series of two separate shots.  The hepatitis B vaccine is recommended for adults shortly after being diagnosed with diabetes.  Adults and children with diabetes should receive all other vaccines according to age-specific recommendations from the Centers for Disease Control and Prevention (CDC). Mental and emotional health Screening for symptoms of eating disorders, anxiety, and depression is recommended at the time of diagnosis and afterward as needed. If your screening shows that you have symptoms (positive screening result), you may need more evaluation and you may work with a mental health care provider. Treatment plan Your treatment plan will be reviewed at every medical visit. You and your health care provider will discuss:  How you are taking your medicines, including insulin.  Any side effects you are experiencing.  Your blood glucose target goals.  The frequency of your blood glucose monitoring.  Lifestyle habits, such as activity level as well as tobacco, alcohol, and substance use. Diabetes self-management education Your health care provider will assess how well you are monitoring your blood glucose levels and whether you are taking your insulin correctly. He or she may refer you to:  A certified diabetes educator to manage your diabetes throughout your life, starting at diagnosis.  A registered dietitian who can create or review your personal nutrition plan.  An exercise specialist who can discuss your activity level and exercise plan. Summary  Managing diabetes (diabetes mellitus) can be complicated. Your diabetes treatment may be managed by a team of health care providers.  Your health care providers follow guidelines in order to help you get the best quality of care.  Standards of care including having regular physical exams, blood tests, blood pressure monitoring, immunizations, screening tests, and education about how to manage your diabetes.  Your health care  providers may also give you more specific instructions based on your individual health. This information is not intended to replace advice given to you by your health care provider. Make sure you discuss any questions you have with your health care provider. Document Revised: 05/20/2018 Document Reviewed: 05/29/2016 Elsevier Patient Education  [place].

## 2020-05-31 ENCOUNTER — Other Ambulatory Visit: Payer: Self-pay | Admitting: Family Medicine

## 2020-05-31 DIAGNOSIS — M1A39X0 Chronic gout due to renal impairment, multiple sites, without tophus (tophi): Secondary | ICD-10-CM

## 2020-06-03 ENCOUNTER — Other Ambulatory Visit: Payer: Self-pay | Admitting: Family Medicine

## 2020-06-03 DIAGNOSIS — M1A39X0 Chronic gout due to renal impairment, multiple sites, without tophus (tophi): Secondary | ICD-10-CM

## 2020-06-30 ENCOUNTER — Encounter: Payer: Self-pay | Admitting: Family Medicine

## 2020-07-01 ENCOUNTER — Telehealth: Payer: Self-pay

## 2020-07-01 DIAGNOSIS — E785 Hyperlipidemia, unspecified: Secondary | ICD-10-CM

## 2020-07-01 DIAGNOSIS — E119 Type 2 diabetes mellitus without complications: Secondary | ICD-10-CM

## 2020-07-01 DIAGNOSIS — E1169 Type 2 diabetes mellitus with other specified complication: Secondary | ICD-10-CM

## 2020-07-01 DIAGNOSIS — R7989 Other specified abnormal findings of blood chemistry: Secondary | ICD-10-CM

## 2020-07-01 NOTE — Telephone Encounter (Signed)
Vmb not set up

## 2020-07-01 NOTE — Telephone Encounter (Signed)
Labs are placed. Ok to come in [place]

## 2020-07-01 NOTE — Telephone Encounter (Signed)
Labs are in

## 2020-07-02 ENCOUNTER — Other Ambulatory Visit: Payer: Self-pay

## 2020-07-02 ENCOUNTER — Other Ambulatory Visit: Payer: Medicare Other

## 2020-07-02 DIAGNOSIS — R7989 Other specified abnormal findings of blood chemistry: Secondary | ICD-10-CM

## 2020-07-02 DIAGNOSIS — E785 Hyperlipidemia, unspecified: Secondary | ICD-10-CM | POA: Diagnosis not present

## 2020-07-02 DIAGNOSIS — E119 Type 2 diabetes mellitus without complications: Secondary | ICD-10-CM

## 2020-07-02 DIAGNOSIS — E1169 Type 2 diabetes mellitus with other specified complication: Secondary | ICD-10-CM | POA: Diagnosis not present

## 2020-07-02 LAB — BAYER DCA HB A1C WAIVED: HB A1C (BAYER DCA - WAIVED): 6.4 % (ref <7.0)

## 2020-07-03 ENCOUNTER — Other Ambulatory Visit: Payer: Self-pay | Admitting: Family Medicine

## 2020-07-03 DIAGNOSIS — E1169 Type 2 diabetes mellitus with other specified complication: Secondary | ICD-10-CM

## 2020-07-03 DIAGNOSIS — E039 Hypothyroidism, unspecified: Secondary | ICD-10-CM

## 2020-07-03 LAB — CMP14+EGFR
ALT: 35 IU/L — ABNORMAL HIGH (ref 0–32)
AST: 36 IU/L (ref 0–40)
Albumin/Globulin Ratio: 1.5 (ref 1.2–2.2)
Albumin: 4.3 g/dL (ref 3.8–4.8)
Alkaline Phosphatase: 77 IU/L (ref 44–121)
BUN/Creatinine Ratio: 15 (ref 12–28)
BUN: 15 mg/dL (ref 8–27)
Bilirubin Total: 0.6 mg/dL (ref 0.0–1.2)
CO2: 22 mmol/L (ref 20–29)
Calcium: 10 mg/dL (ref 8.7–10.3)
Chloride: 103 mmol/L (ref 96–106)
Creatinine, Ser: 1.03 mg/dL — ABNORMAL HIGH (ref 0.57–1.00)
GFR calc Af Amer: 64 mL/min/{1.73_m2} (ref >59)
GFR calc non Af Amer: 56 mL/min/{1.73_m2} — ABNORMAL LOW (ref >59)
Globulin, Total: 2.8 g/dL (ref 1.5–4.5)
Glucose: 120 mg/dL — ABNORMAL HIGH (ref 65–99)
Potassium: 4.8 mmol/L (ref 3.5–5.2)
Sodium: 140 mmol/L (ref 134–144)
Total Protein: 7.1 g/dL (ref 6.0–8.5)

## 2020-07-03 LAB — LIPID PANEL
Chol/HDL Ratio: 7.1 ratio — ABNORMAL HIGH (ref 0.0–4.4)
Cholesterol, Total: 285 mg/dL — ABNORMAL HIGH (ref 100–199)
HDL: 40 mg/dL (ref >39)
LDL Chol Calc (NIH): 202 mg/dL — ABNORMAL HIGH (ref 0–99)
Triglycerides: 220 mg/dL — ABNORMAL HIGH (ref 0–149)
VLDL Cholesterol Cal: 43 mg/dL — ABNORMAL HIGH (ref 5–40)

## 2020-07-03 LAB — THYROID PANEL WITH TSH
Free Thyroxine Index: 1.9 (ref 1.2–4.9)
T3 Uptake Ratio: 24 % (ref 24–39)
T4, Total: 8 ug/dL (ref 4.5–12.0)
TSH: 5.03 u[IU]/mL — ABNORMAL HIGH (ref 0.450–4.500)

## 2020-07-03 MED ORDER — ATORVASTATIN CALCIUM 20 MG PO TABS: 20.0000 mg | ORAL_TABLET | Freq: Every day | ORAL | 3 refills | Status: DC

## 2020-07-03 MED ORDER — LEVOTHYROXINE SODIUM 50 MCG PO TABS: 50.0000 ug | ORAL_TABLET | Freq: Every day | ORAL | 0 refills | Status: DC

## 2020-07-05 ENCOUNTER — Ambulatory Visit (INDEPENDENT_AMBULATORY_CARE_PROVIDER_SITE_OTHER): Payer: Medicare Other | Admitting: Family Medicine

## 2020-07-05 ENCOUNTER — Encounter: Payer: Self-pay | Admitting: Family Medicine

## 2020-07-05 ENCOUNTER — Other Ambulatory Visit: Payer: Self-pay

## 2020-07-05 VITALS — BP 94/52 | HR 68 | Temp 97.8°F | Ht 63.0 in | Wt 186.0 lb

## 2020-07-05 DIAGNOSIS — E785 Hyperlipidemia, unspecified: Secondary | ICD-10-CM

## 2020-07-05 DIAGNOSIS — R0781 Pleurodynia: Secondary | ICD-10-CM | POA: Diagnosis not present

## 2020-07-05 DIAGNOSIS — N1831 Chronic kidney disease, stage 3a: Secondary | ICD-10-CM | POA: Diagnosis not present

## 2020-07-05 DIAGNOSIS — E039 Hypothyroidism, unspecified: Secondary | ICD-10-CM

## 2020-07-05 DIAGNOSIS — E1169 Type 2 diabetes mellitus with other specified complication: Secondary | ICD-10-CM

## 2020-07-05 DIAGNOSIS — E119 Type 2 diabetes mellitus without complications: Secondary | ICD-10-CM | POA: Diagnosis not present

## 2020-07-05 NOTE — Progress Notes (Signed)
Subjective: CC: f/u New onset DM, HTN, HLD PCP: Janora Norlander, DO DGL:OVFIE W Soltis is a 69 y.o. female presenting to clinic today for:  1. New onset Type 2 Diabetes w/ HLD:  She has a strong family history of type 2 diabetes in the family  Her A1c was noted to be 9.2 at her last visit.  She admits that this was likely elevated to increasing her carbohydrate consumption as meat seem to be exacerbating her gout flares.  She has since really cut back on carbohydrate intake.  She is subsequently lost weight and doing well.  She is very adamant about not taking a statin as she has had severe intolerance to statins in the past.  This includes Livalo.  She has not taken Vascepa or Nexletol however.  There is a strong family history of cardiovascular disease as well.   Last eye exam: Needs Last foot exam: UTD Last A1c:  Lab Results  Component Value Date   HGBA1C 6.4 07/02/2020   Nephropathy screen indicated?:  UTD Last flu, zoster and/or pneumovax:  There is no immunization history on file for this patient.  2.  Rib pain Patient reports right rib pain that has been present for the last couple of months.  The pain seems to be exacerbated by activities like riding a lawnmower or turning a certain way in bed.  Denies any preceding injury or change in activity.  She denies any right upper quadrant pain.  Though she reports she does have a history of gallbladder sludge and this does not feel like her typical gallbladder attack.  No association of pain with food intake  ROS: Per HPI  No Known Allergies Past Medical History:  Diagnosis Date   Arthritis    Cataract    GERD (gastroesophageal reflux disease)    Gout    Hyperlipidemia    Hypertension    Microscopic colitis    Renal cancer ([place]) 2010    Current Outpatient Medications:    allopurinol (ZYLOPRIM) 100 MG tablet, Take 1 tablet (100 mg total) by mouth daily., Disp: 90 tablet, Rfl: 3   aspirin EC 81 MG tablet,  Take 81 mg by mouth 2 (two) times daily., Disp: , Rfl:    atenolol (TENORMIN) 25 MG tablet, TAKE ONE (1) TABLET EACH DAY, Disp: 90 tablet, Rfl: 3   B Complex Vitamins (B-COMPLEX/B-12 PO), Take by mouth., Disp: , Rfl:    budesonide (ENTOCORT EC) 3 MG 24 hr capsule, Take by mouth as needed. , Disp: , Rfl:    colchicine 0.6 MG tablet, TAKE 2 TABLETS AT THE ONSET OF ATTACK THEN 1 TAB EVERY 2 HOURS AS NEEDED UP TO 6 TABLETS THEN TAKE TWICE DAILY UNTIL SYMPTOMS CLEAR, Disp: 30 tablet, Rfl: 0   furosemide (LASIX) 20 MG tablet, TAKE ONE (1) TABLET EACH DAY, Disp: 90 tablet, Rfl: 3   gabapentin (NEURONTIN) 300 MG capsule, Take 1 capsule (300 mg total) by mouth at bedtime., Disp: 30 capsule, Rfl: 2   KRILL OIL 1000 MG CAPS, Take by mouth., Disp: , Rfl:    L-Lysine 1000 MG TABS, Take by mouth., Disp: , Rfl:    Misc Natural Products (OSTEO BI-FLEX ADV DOUBLE ST PO), Take 2 capsules by mouth daily. , Disp: , Rfl:    potassium chloride SA (KLOR-CON) 20 MEQ tablet, Take 1 tablet (20 mEq total) by mouth daily., Disp: 90 tablet, Rfl: 3   UNABLE TO FIND, Always eye drops, Disp: , Rfl:  VITAMIN D PO, Take by mouth., Disp: , Rfl:    atorvastatin (LIPITOR) 20 MG tablet, Take 1 tablet (20 mg total) by mouth daily. (Patient not taking: Reported on 07/05/2020), Disp: 90 tablet, Rfl: 3   levothyroxine (SYNTHROID) 50 MCG tablet, Take 1 tablet (50 mcg total) by mouth daily. Recheck lab in 6-8 weeks (Patient not taking: Reported on 07/05/2020), Disp: 90 tablet, Rfl: 0 Social History   Socioeconomic History   Marital status: Married    Spouse name: Not on file   Number of children: Not on file   Years of education: Not on file   Highest education level: Not on file  Occupational History   Not on file  Tobacco Use   Smoking status: Never Smoker   Smokeless tobacco: Never Used  Vaping Use   Vaping Use: Never used  Substance and Sexual Activity   Alcohol use: No   Drug use: No   Sexual  activity: Not on file  Other Topics Concern   Not on file  Social History Narrative   Not on file   Social Determinants of Health   Financial Resource Strain:    Difficulty of Paying Living Expenses: Not on file  Food Insecurity:    Worried About [place] in the Last Year: Not on file   Ran Out of Food in the Last Year: Not on file  Transportation Needs:    Lack of Transportation (Medical): Not on file   Lack of Transportation (Non-Medical): Not on file  Physical Activity:    Days of Exercise per Week: Not on file   Minutes of Exercise per Session: Not on file  Stress:    Feeling of Stress : Not on file  Social Connections:    Frequency of Communication with Friends and Family: Not on file   Frequency of Social Gatherings with Friends and Family: Not on file   Attends Religious Services: Not on file   Active Member of Clubs or Organizations: Not on file   Attends Archivist Meetings: Not on file   Marital Status: Not on file  Intimate Partner Violence:    Fear of Current or Ex-Partner: Not on file   Emotionally Abused: Not on file   Physically Abused: Not on file   Sexually Abused: Not on file   Family History  Problem Relation Age of Onset   Hyperlipidemia Mother    Stroke Mother    Diabetes Father    Heart disease Father    Hypertension Father    Stroke Father    Colon cancer Neg Hx    Esophageal cancer Neg Hx    Stomach cancer Neg Hx    Rectal cancer Neg Hx     Objective: Office vital signs reviewed. BP (!) 94/52    Pulse 68    Temp 97.8 F (36.6 C) (Temporal)    Ht 5\' 3"  (1.6 m)    Wt 186 lb (84.4 kg)    SpO2 97%    BMI 32.95 kg/m   Physical Examination:  General: Awake, alert, well nourished, No acute distress HEENT: Normal; no goiter Cardio: regular rate and rhythm, S1S2 heard, no murmurs appreciated Pulm: clear to auscultation bilaterally, no wheezes, rhonchi or rales; normal work of breathing on room  air Extremities: warm, well perfused, No edema, cyanosis or clubbing; +2 pulses bilaterally MSK:  Right Ribs: She has quite a bit of tenderness to palpation anteriorly and posteriorly at around rib 7-8.  There are no  palpable defects or soft tissue changes Skin: warm Neuro: no tremor  Assessment/ Plan: 69 y.o. female   Diet-controlled diabetes mellitus ([place])  Hyperlipidemia associated with type 2 diabetes mellitus ([place]) - Plan: Lipid panel  Stage 3a chronic kidney disease ([place])  Acquired hypothyroidism - Plan: Thyroid Panel With TSH  Rib pain on right side  Her sugar is now diet controlled.  She is had quite a substantial drop in her A1c from 9.2 down to 6.4.  I congratulated her on this accomplishment.   We reviewed her cholesterol labs, which unfortunately were persistently elevated.  Her 10-year risk of stroke and heart attack is over 15%.  She has had quite a bit of intolerance to multiple statins in the past including Livalo.  We discussed nonstatin alternatives including Vascepa and Nexletol.  I would like her to come in for repeat thyroid panel.  She was recently prescribed Synthroid 50 mcg.  I wonder if the thyroid may be impacting her cholesterol and liver function test.  I have also placed a fasting lipid panel.  She will come in the next 6 to 8 weeks to have these repeated.  If cholesterol remains elevated, low threshold to have her see Almyra Free for further evaluation of statin alternatives.  With regards to her right rib pain.  This is likely a dysfunctional rib.  I did offer her referral to Dr. Tamala Julian for OMT treatment.  We also discussed alternative treatments including chiropractic medicine and simply medicinal treatment with lidocaine patches.  She would like to hold off on any of these for now but will likely follow-up if symptoms do not improve.  No orders of the defined types were placed in this encounter.  No orders of the defined types were placed in this  encounter.    Janora Norlander, DO [place] [phone]

## 2020-07-05 NOTE — Patient Instructions (Addendum)
We talked about the fact that you have a 15% risk of stroke and heart attack in the next 10 years.  Given your family is high risk of cardiovascular disease this does worry me.  There are a couple of options that are nonstatins that can help control cholesterol, one of them being Nexletol and the other being Vascepa.  I would like you to come back in 3 months for fasting cholesterol panel and repeat thyroid panel.  As we discussed, uncontrolled thyroid levels can sometimes impact cholesterol. If cholesterol is still elevated, I would recommend that you see Almyra Free for further discussion of these medications.  For your rib pain, we discussed options of chiropractic medicine versus seeing an osteopath for manual manipulation of the rib versus using lidocaine patches for pain.  Let me know if you want a referral to Dr. Tamala Julian for OMT treatment of these ribs  Luther Hearing, [place] physician in [place], [place] COVID-19 info: [email or web address] Get online care: [email or web address] Address: [address], [place], [place] [address] Phone: [phone]   Costochondritis Costochondritis is swelling and irritation (inflammation) of the tissue (cartilage) that connects your ribs to your breastbone (sternum). This causes pain in the front of your chest. Usually, the pain:  Starts gradually.  Is in more than one rib. This condition usually goes away on its own over time. Follow these instructions at home:  Do not do anything that makes your pain worse.  If directed, put ice on the painful area: ? Put ice in a plastic bag. ? Place a towel between your skin and the bag. ? Leave the ice on for 20 minutes, 2-3 times a day.  If directed, put heat on the affected area as often as told by your doctor. Use the heat source that your doctor tells you to use, such as a moist heat pack or a heating pad. ? Place a towel between your skin and the heat source. ? Leave the heat on for 20-30  minutes. ? Take off the heat if your skin turns bright red. This is very important if you cannot feel pain, heat, or cold. You may have a greater risk of getting burned.  Take over-the-counter and prescription medicines only as told by your doctor.  Return to your normal activities as told by your doctor. Ask your doctor what activities are safe for you.  Keep all follow-up visits as told by your doctor. This is important. Contact a doctor if:  You have chills or a fever.  Your pain does not go away or it gets worse.  You have a cough that does not go away. Get help right away if:  You are short of breath. This information is not intended to replace advice given to you by your health care provider. Make sure you discuss any questions you have with your health care provider. Document Revised: 09/15/2017 Document Reviewed: 12/25/2015 Elsevier Patient Education  2020 [employer].

## 2020-08-02 ENCOUNTER — Other Ambulatory Visit: Payer: Self-pay | Admitting: Family Medicine

## 2020-08-02 DIAGNOSIS — I10 Essential (primary) hypertension: Secondary | ICD-10-CM

## 2020-09-19 ENCOUNTER — Encounter: Payer: Self-pay | Admitting: Family Medicine

## 2020-09-19 MED ORDER — LEVOTHYROXINE SODIUM 50 MCG PO TABS: 50.0000 ug | ORAL_TABLET | Freq: Every day | ORAL | 0 refills | Status: DC

## 2020-10-14 ENCOUNTER — Other Ambulatory Visit: Payer: Self-pay | Admitting: Family Medicine

## 2020-10-22 ENCOUNTER — Encounter: Payer: Self-pay | Admitting: Family Medicine

## 2020-10-24 ENCOUNTER — Other Ambulatory Visit: Payer: Self-pay

## 2020-10-24 ENCOUNTER — Other Ambulatory Visit: Payer: Medicare Other

## 2020-10-24 DIAGNOSIS — E1169 Type 2 diabetes mellitus with other specified complication: Secondary | ICD-10-CM

## 2020-10-24 DIAGNOSIS — E039 Hypothyroidism, unspecified: Secondary | ICD-10-CM | POA: Diagnosis not present

## 2020-10-24 DIAGNOSIS — E785 Hyperlipidemia, unspecified: Secondary | ICD-10-CM

## 2020-10-24 DIAGNOSIS — E119 Type 2 diabetes mellitus without complications: Secondary | ICD-10-CM

## 2020-10-25 LAB — CMP14+EGFR
ALT: 26 IU/L (ref 0–32)
AST: 25 IU/L (ref 0–40)
Albumin/Globulin Ratio: 1.6 (ref 1.2–2.2)
Albumin: 4.2 g/dL (ref 3.8–4.8)
Alkaline Phosphatase: 78 IU/L (ref 44–121)
BUN/Creatinine Ratio: 18 (ref 12–28)
BUN: 18 mg/dL (ref 8–27)
Bilirubin Total: 0.6 mg/dL (ref 0.0–1.2)
CO2: 20 mmol/L (ref 20–29)
Calcium: 10.3 mg/dL (ref 8.7–10.3)
Chloride: 103 mmol/L (ref 96–106)
Creatinine, Ser: 1.01 mg/dL — ABNORMAL HIGH (ref 0.57–1.00)
GFR calc Af Amer: 66 mL/min/{1.73_m2} (ref >59)
GFR calc non Af Amer: 57 mL/min/{1.73_m2} — ABNORMAL LOW (ref >59)
Globulin, Total: 2.6 g/dL (ref 1.5–4.5)
Glucose: 113 mg/dL — ABNORMAL HIGH (ref 65–99)
Potassium: 4.8 mmol/L (ref 3.5–5.2)
Sodium: 141 mmol/L (ref 134–144)
Total Protein: 6.8 g/dL (ref 6.0–8.5)

## 2020-10-25 LAB — THYROID PANEL WITH TSH
Free Thyroxine Index: 2.3 (ref 1.2–4.9)
T3 Uptake Ratio: 26 % (ref 24–39)
T4, Total: 8.7 ug/dL (ref 4.5–12.0)
TSH: 1.86 u[IU]/mL (ref 0.450–4.500)

## 2020-10-25 LAB — LIPID PANEL
Chol/HDL Ratio: 6 ratio — ABNORMAL HIGH (ref 0.0–4.4)
Cholesterol, Total: 298 mg/dL — ABNORMAL HIGH (ref 100–199)
HDL: 50 mg/dL (ref >39)
LDL Chol Calc (NIH): 219 mg/dL — ABNORMAL HIGH (ref 0–99)
Triglycerides: 153 mg/dL — ABNORMAL HIGH (ref 0–149)
VLDL Cholesterol Cal: 29 mg/dL (ref 5–40)

## 2020-10-29 ENCOUNTER — Telehealth: Payer: Self-pay | Admitting: Family Medicine

## 2020-10-29 ENCOUNTER — Encounter: Payer: Self-pay | Admitting: Family Medicine

## 2020-10-29 ENCOUNTER — Ambulatory Visit (INDEPENDENT_AMBULATORY_CARE_PROVIDER_SITE_OTHER): Payer: Medicare Other | Admitting: Family Medicine

## 2020-10-29 ENCOUNTER — Other Ambulatory Visit: Payer: Self-pay

## 2020-10-29 VITALS — BP 111/56 | HR 59 | Temp 97.1°F | Ht 63.0 in | Wt 190.0 lb

## 2020-10-29 DIAGNOSIS — E119 Type 2 diabetes mellitus without complications: Secondary | ICD-10-CM | POA: Diagnosis not present

## 2020-10-29 DIAGNOSIS — E785 Hyperlipidemia, unspecified: Secondary | ICD-10-CM | POA: Diagnosis not present

## 2020-10-29 DIAGNOSIS — E039 Hypothyroidism, unspecified: Secondary | ICD-10-CM | POA: Diagnosis not present

## 2020-10-29 DIAGNOSIS — G720 Drug-induced myopathy: Secondary | ICD-10-CM

## 2020-10-29 DIAGNOSIS — E1169 Type 2 diabetes mellitus with other specified complication: Secondary | ICD-10-CM | POA: Diagnosis not present

## 2020-10-29 DIAGNOSIS — T466X5A Adverse effect of antihyperlipidemic and antiarteriosclerotic drugs, initial encounter: Secondary | ICD-10-CM | POA: Diagnosis not present

## 2020-10-29 DIAGNOSIS — N1831 Chronic kidney disease, stage 3a: Secondary | ICD-10-CM

## 2020-10-29 LAB — BAYER DCA HB A1C WAIVED: HB A1C (BAYER DCA - WAIVED): 6.1 % (ref <7.0)

## 2020-10-29 NOTE — Progress Notes (Signed)
Subjective: CC: Dm, thyroid PCP: Janora Norlander, DO Kristie Lewis is a 70 y.o. female presenting to clinic today for:  1. Diet controlled Type 2 Diabetes with hypertension, hyperlipidemia:  Diet controlled diabetes.  She has persistent hyperlipidemia.  She never started the atorvastatin because she notes that she is had quite a bit of difficulty with various statins inducing myopathy.  She is been on Zetia previously.  She does not want to trial Nexletol.  She is "been dealing with this for 30 years".  She knows what the repercussions of uncontrolled cholesterol is and understands that she has a very high risk of stroke and heart attack.  She tries to reduce her carbs but admits that over the weekend she had biscuits, butter, [employer] chicken tenders, mac & cheese and steak. She did have a salad for dinner though with Caesar dressing.  She has gained a little weight because she is not as physically active as she normally is due to the weather.  Last eye exam: Needs Last foot exam: Up-to-date Last A1c:  Lab Results  Component Value Date   HGBA1C 6.4 07/02/2020   Nephropathy screen indicated?:  Up-to-date Last flu, zoster and/or pneumovax:  There is no immunization history on file for this patient.  2.  Hypothyroidism Patient is compliant with Synthroid 50 mcg daily.  She occasionally does have heart palpitations.  No reports of tremor, change in bowel habit.  Mild weight gain as above.   ROS: Per HPI  No Known Allergies Past Medical History:  Diagnosis Date  . Arthritis   . Cataract   . GERD (gastroesophageal reflux disease)   . Gout   . Hyperlipidemia   . Hypertension   . Microscopic colitis   . Renal cancer ([place]) 2010    Current Outpatient Medications:  .  allopurinol (ZYLOPRIM) 100 MG tablet, Take 1 tablet (100 mg total) by mouth daily., Disp: 90 tablet, Rfl: 3 .  aspirin EC 81 MG tablet, Take 81 mg by mouth 2 (two) times daily., Disp: , Rfl:  .  atenolol  (TENORMIN) 25 MG tablet, TAKE ONE (1) TABLET EACH DAY, Disp: 90 tablet, Rfl: 1 .  atorvastatin (LIPITOR) 20 MG tablet, Take 1 tablet (20 mg total) by mouth daily. (Patient not taking: Reported on 07/05/2020), Disp: 90 tablet, Rfl: 3 .  B Complex Vitamins (B-COMPLEX/B-12 PO), Take by mouth., Disp: , Rfl:  .  budesonide (ENTOCORT EC) 3 MG 24 hr capsule, Take by mouth as needed. , Disp: , Rfl:  .  colchicine 0.6 MG tablet, TAKE 2 TABLETS AT THE ONSET OF ATTACK THEN 1 TAB EVERY 2 HOURS AS NEEDED UP TO 6 TABLETS THEN TAKE TWICE DAILY UNTIL SYMPTOMS CLEAR, Disp: 30 tablet, Rfl: 0 .  furosemide (LASIX) 20 MG tablet, TAKE ONE (1) TABLET EACH DAY, Disp: 90 tablet, Rfl: 1 .  gabapentin (NEURONTIN) 300 MG capsule, Take 1 capsule (300 mg total) by mouth at bedtime., Disp: 30 capsule, Rfl: 2 .  KRILL OIL 1000 MG CAPS, Take by mouth., Disp: , Rfl:  .  L-Lysine 1000 MG TABS, Take by mouth., Disp: , Rfl:  .  levothyroxine (SYNTHROID) 50 MCG tablet, TAKE ONE (1) TABLET EACH DAY, Disp: 90 tablet, Rfl: 0 .  Misc Natural Products (OSTEO BI-FLEX ADV DOUBLE ST PO), Take 2 capsules by mouth daily. , Disp: , Rfl:  .  potassium chloride SA (KLOR-CON) 20 MEQ tablet, TAKE ONE (1) TABLET EACH DAY, Disp: 90 tablet, Rfl: 1 .  UNABLE TO FIND, Always eye drops, Disp: , Rfl:  .  VITAMIN D PO, Take by mouth., Disp: , Rfl:  Social History   Socioeconomic History  . Marital status: Married    Spouse name: Not on file  . Number of children: Not on file  . Years of education: Not on file  . Highest education level: Not on file  Occupational History  . Not on file  Tobacco Use  . Smoking status: Never Smoker  . Smokeless tobacco: Never Used  Vaping Use  . Vaping Use: Never used  Substance and Sexual Activity  . Alcohol use: No  . Drug use: No  . Sexual activity: Not on file  Other Topics Concern  . Not on file  Social History Narrative  . Not on file   Social Determinants of Health   Financial Resource Strain: Not  on file  Food Insecurity: Not on file  Transportation Needs: Not on file  Physical Activity: Not on file  Stress: Not on file  Social Connections: Not on file  Intimate Partner Violence: Not on file   Family History  Problem Relation Age of Onset  . Hyperlipidemia Mother   . Stroke Mother   . Diabetes Father   . Heart disease Father   . Hypertension Father   . Stroke Father   . Colon cancer Neg Hx   . Esophageal cancer Neg Hx   . Stomach cancer Neg Hx   . Rectal cancer Neg Hx     Objective: Office vital signs reviewed. BP (!) 111/56   Pulse (!) 59   Temp (!) 97.1 F (36.2 C) (Temporal)   Ht _0  (1.6 m)   Wt 190 lb (86.2 kg)   SpO2 97%   BMI 33.66 kg/m   Physical Examination:  General: Awake, alert, well nourished, No acute distress HEENT: Normal; sclera white.  Moist mucous membranes Cardio: regular rate and rhythm, S1S2 heard, no murmurs appreciated Pulm: clear to auscultation bilaterally, no wheezes, rhonchi or rales; normal work of breathing on room air GI: soft, non-tender, non-distended, bowel sounds present x4, no hepatomegaly, no splenomegaly, no masses Extremities: warm, well perfused, No edema, cyanosis or clubbing; +2 pulses bilaterally MSK: normal gait and station  The 10-year ASCVD risk score Mikey Bussing DC Jr., et al., 2013) is: 18.6%   Values used to calculate the score:     Age: 16 years     Sex: Female     Is Non-Hispanic African American: No     Diabetic: Yes     Tobacco smoker: No     Systolic Blood Pressure: 740 mmHg     Is BP treated: Yes     HDL Cholesterol: 50 mg/dL     Total Cholesterol: 298 mg/dL   Assessment/ Plan: 70 y.o. female   Diet-controlled diabetes mellitus ([place]) - Plan: Bayer DCA Hb A1c Waived, Bayer DCA Hb A1c Waived, CANCELED: Bayer DCA Hb A1c Waived  Hyperlipidemia associated with type 2 diabetes mellitus ([place]) - Plan: Lipid panel, CMP14+EGFR  Statin myopathy - Plan: Lipid panel, CMP14+EGFR  Stage 3a chronic kidney  disease ([place]) - Plan: CMP14+EGFR, CBC, VITAMIN D 25 Hydroxy (Vit-D Deficiency, Fractures)  Acquired hypothyroidism - Plan: Thyroid Panel With TSH  Sugar still controlled with A1c of 6.1.  Cholesterol unfortunately has gotten worse with LDL up to 219.  Her ASCVD risk score is quite high at 18.6%.  I do worry quite a bit about her, particularly given high LDL and total combination  of elevated ASCVD risk score.  Unfortunately, she is not able to tolerate statins due to myopathy and is extremely reluctant to trial other medications including Nexletol, which she has not used before.  She does understand the ramifications of uncontrolled cholesterol and the cardiovascular risk it poses.  She is accepting of these risks and will continue with diet modification as able to try and reduce risk  Check renal function.  Will check vitamin D and CBC given known CKD 3 a at next visit.  Her renal function was essentially stable and we reviewed her labs today  Thyroid panel was controlled.  She will continue current regimen.   No orders of the defined types were placed in this encounter.  No orders of the defined types were placed in this encounter.    Janora Norlander, DO [place] [phone]

## 2020-10-29 NOTE — Telephone Encounter (Signed)
What labs would you like patient to have and I can order

## 2020-10-29 NOTE — Telephone Encounter (Signed)
Labs are in

## 2020-10-29 NOTE — Telephone Encounter (Signed)
Please put in lab orders for pt before next appt. Pt will be calling to check on these orders

## 2020-10-29 NOTE — Telephone Encounter (Signed)
Detailed message left for patient that lab orders are in epic.

## 2020-11-04 ENCOUNTER — Encounter: Payer: Self-pay | Admitting: Family Medicine

## 2020-11-11 ENCOUNTER — Other Ambulatory Visit: Payer: Self-pay | Admitting: Family Medicine

## 2020-11-11 DIAGNOSIS — I10 Essential (primary) hypertension: Secondary | ICD-10-CM

## 2020-12-07 IMAGING — DX DG CHEST 2V
2 series · 2 of 2 positions shown · non-contrast
Comparison: Chest x-ray 01/04/2017.

CLINICAL DATA: 69-year-old female with history of renal cancer
presenting with fatigue.

EXAM:
CHEST - 2 VIEW

[chest pa]
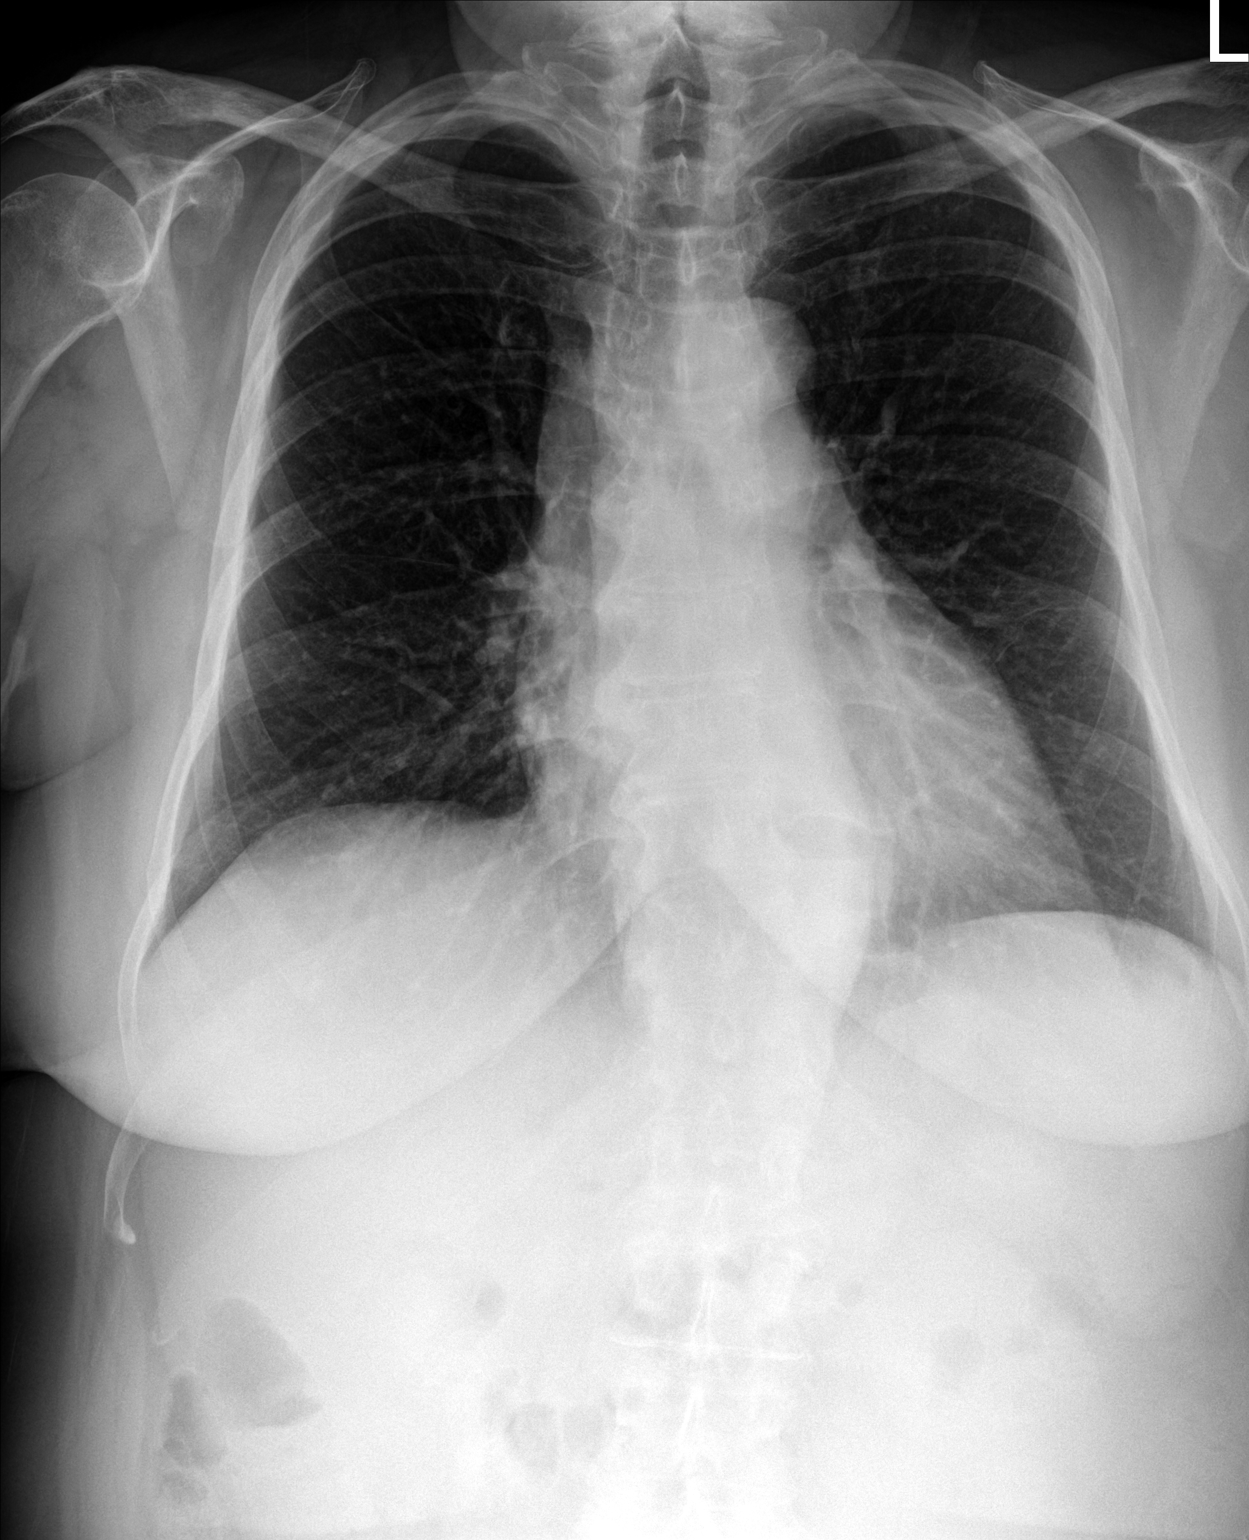

[chest lat]
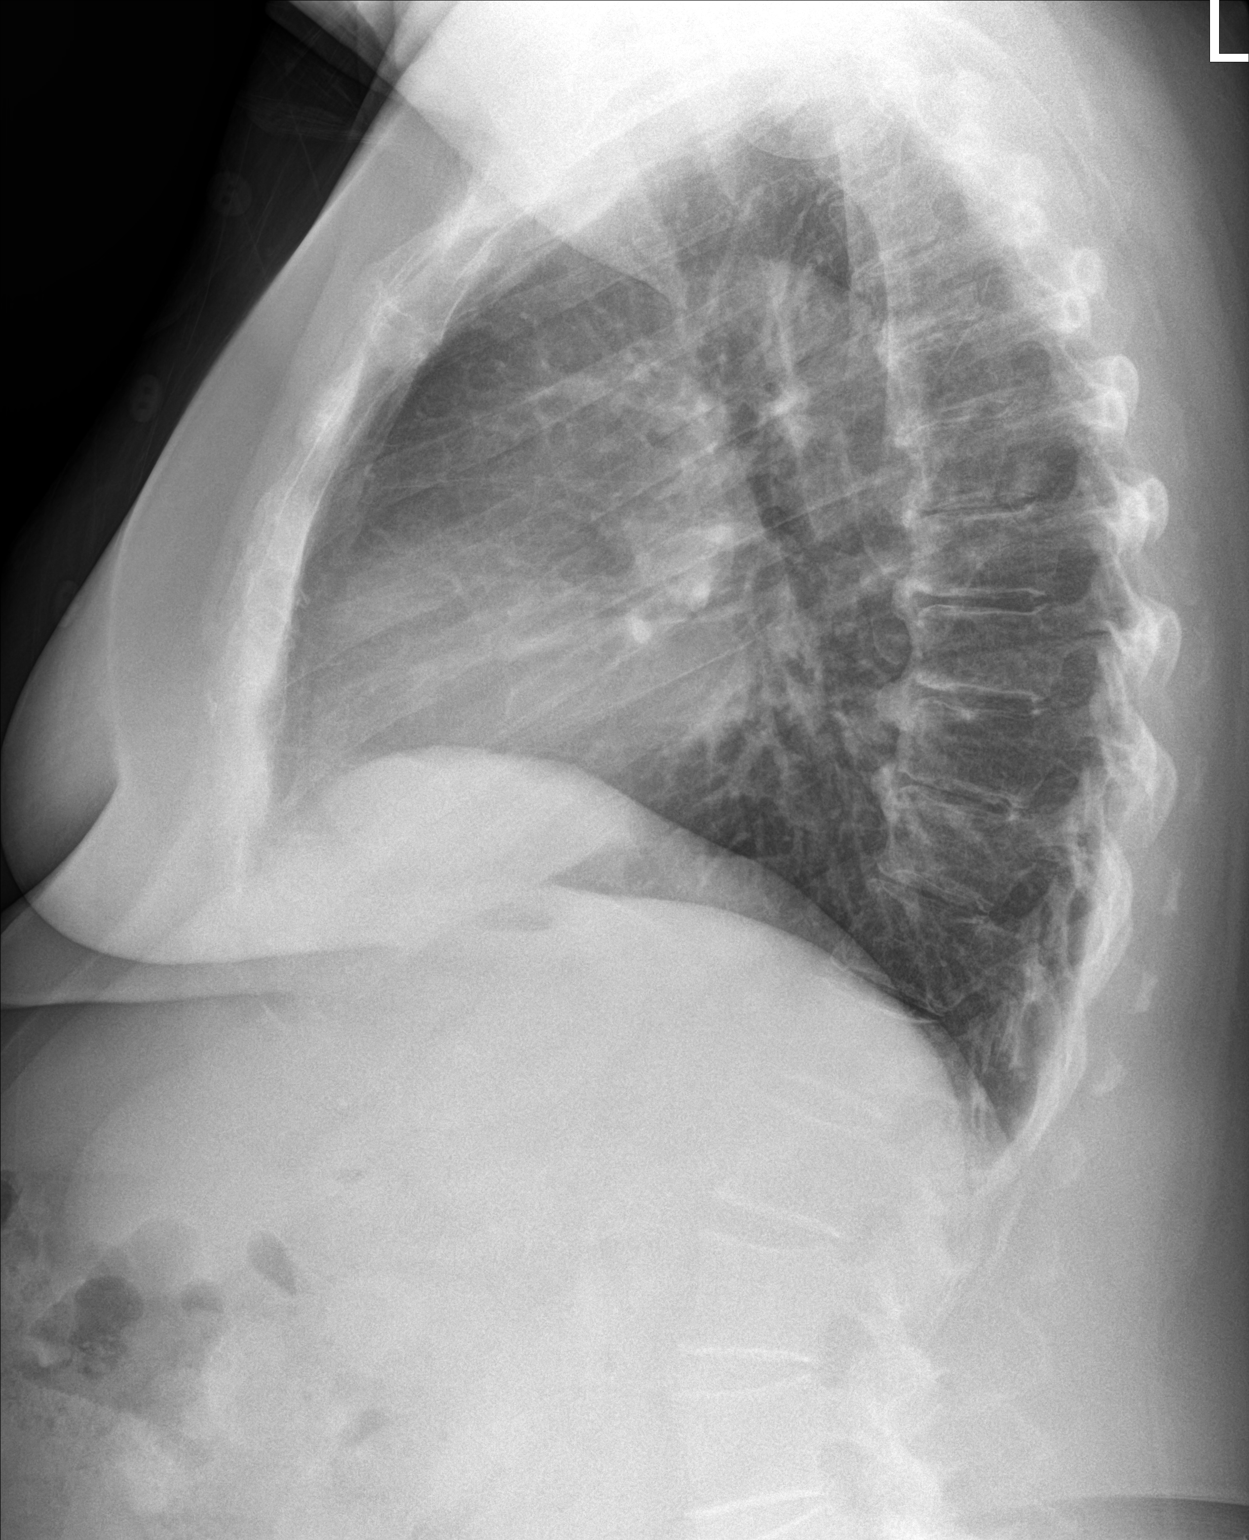

[2 of 2 positions shown; findings below may reference images not displayed]

FINDINGS: Lung volumes are normal. No consolidative airspace disease. No
pleural effusions. No pneumothorax. No pulmonary nodule or mass
noted. Pulmonary vasculature and the cardiomediastinal silhouette
are within normal limits.
IMPRESSION: No radiographic evidence of acute cardiopulmonary disease.

## 2021-01-21 ENCOUNTER — Other Ambulatory Visit: Payer: Self-pay | Admitting: Family Medicine

## 2021-01-21 DIAGNOSIS — I10 Essential (primary) hypertension: Secondary | ICD-10-CM

## 2021-02-19 DIAGNOSIS — H04123 Dry eye syndrome of bilateral lacrimal glands: Secondary | ICD-10-CM | POA: Diagnosis not present

## 2021-03-10 ENCOUNTER — Other Ambulatory Visit: Payer: Self-pay | Admitting: Family Medicine

## 2021-03-10 DIAGNOSIS — M1A39X0 Chronic gout due to renal impairment, multiple sites, without tophus (tophi): Secondary | ICD-10-CM

## 2021-03-11 ENCOUNTER — Other Ambulatory Visit: Payer: Self-pay | Admitting: Family Medicine

## 2021-03-11 DIAGNOSIS — M1A39X0 Chronic gout due to renal impairment, multiple sites, without tophus (tophi): Secondary | ICD-10-CM

## 2021-05-07 ENCOUNTER — Other Ambulatory Visit: Payer: Self-pay | Admitting: Family Medicine

## 2021-05-07 DIAGNOSIS — I10 Essential (primary) hypertension: Secondary | ICD-10-CM

## 2021-05-19 ENCOUNTER — Other Ambulatory Visit: Payer: Self-pay | Admitting: Family Medicine

## 2021-05-19 DIAGNOSIS — M1A39X0 Chronic gout due to renal impairment, multiple sites, without tophus (tophi): Secondary | ICD-10-CM

## 2021-06-16 ENCOUNTER — Other Ambulatory Visit: Payer: Self-pay

## 2021-06-16 ENCOUNTER — Encounter: Payer: Self-pay | Admitting: Family Medicine

## 2021-06-16 ENCOUNTER — Ambulatory Visit (INDEPENDENT_AMBULATORY_CARE_PROVIDER_SITE_OTHER): Payer: Medicare Other | Admitting: Family Medicine

## 2021-06-16 VITALS — BP 110/59 | HR 56 | Temp 97.3°F | Ht 63.0 in | Wt 195.8 lb

## 2021-06-16 DIAGNOSIS — T466X5A Adverse effect of antihyperlipidemic and antiarteriosclerotic drugs, initial encounter: Secondary | ICD-10-CM | POA: Diagnosis not present

## 2021-06-16 DIAGNOSIS — E785 Hyperlipidemia, unspecified: Secondary | ICD-10-CM

## 2021-06-16 DIAGNOSIS — E1122 Type 2 diabetes mellitus with diabetic chronic kidney disease: Secondary | ICD-10-CM

## 2021-06-16 DIAGNOSIS — N1831 Chronic kidney disease, stage 3a: Secondary | ICD-10-CM

## 2021-06-16 DIAGNOSIS — Z0000 Encounter for general adult medical examination without abnormal findings: Secondary | ICD-10-CM

## 2021-06-16 DIAGNOSIS — Z2821 Immunization not carried out because of patient refusal: Secondary | ICD-10-CM

## 2021-06-16 DIAGNOSIS — Z0001 Encounter for general adult medical examination with abnormal findings: Secondary | ICD-10-CM | POA: Diagnosis not present

## 2021-06-16 DIAGNOSIS — G8929 Other chronic pain: Secondary | ICD-10-CM | POA: Insufficient documentation

## 2021-06-16 DIAGNOSIS — M25561 Pain in right knee: Secondary | ICD-10-CM

## 2021-06-16 DIAGNOSIS — E039 Hypothyroidism, unspecified: Secondary | ICD-10-CM

## 2021-06-16 DIAGNOSIS — E1169 Type 2 diabetes mellitus with other specified complication: Secondary | ICD-10-CM

## 2021-06-16 DIAGNOSIS — E119 Type 2 diabetes mellitus without complications: Secondary | ICD-10-CM

## 2021-06-16 DIAGNOSIS — I129 Hypertensive chronic kidney disease with stage 1 through stage 4 chronic kidney disease, or unspecified chronic kidney disease: Secondary | ICD-10-CM

## 2021-06-16 DIAGNOSIS — G720 Drug-induced myopathy: Secondary | ICD-10-CM | POA: Diagnosis not present

## 2021-06-16 DIAGNOSIS — M1A39X0 Chronic gout due to renal impairment, multiple sites, without tophus (tophi): Secondary | ICD-10-CM

## 2021-06-16 DIAGNOSIS — N183 Chronic kidney disease, stage 3 unspecified: Secondary | ICD-10-CM

## 2021-06-16 LAB — BAYER DCA HB A1C WAIVED: HB A1C (BAYER DCA - WAIVED): 5.9 % — ABNORMAL HIGH (ref 4.8–5.6)

## 2021-06-16 MED ORDER — ALLOPURINOL 100 MG PO TABS: 100.0000 mg | ORAL_TABLET | Freq: Every day | ORAL | 3 refills | Status: DC

## 2021-06-16 MED ORDER — FUROSEMIDE 20 MG PO TABS: 20.0000 mg | ORAL_TABLET | Freq: Every day | ORAL | 3 refills | Status: DC

## 2021-06-16 MED ORDER — ATENOLOL 25 MG PO TABS: 25.0000 mg | ORAL_TABLET | Freq: Every day | ORAL | 3 refills | Status: DC

## 2021-06-16 MED ORDER — POTASSIUM CHLORIDE CRYS ER 20 MEQ PO TBCR: EXTENDED_RELEASE_TABLET | ORAL | 3 refills | Status: DC

## 2021-06-16 NOTE — Progress Notes (Signed)
Kristie Lewis is a 70 y.o. female presents to office today for annual physical exam examination.    Concerns today include: 1. Type 2 Diabetes with hypertension, hyperlipidemia w. UKG2R and gout:  She is had diet-controlled diabetes.  She is compliant with her atenolol, Lasix, potassium and allopurinol.  No gout flares in over a month and a half.  Not currently treated with a cholesterol medication.  Last eye exam: needs Last foot exam: needs Last A1c:  Lab Results  Component Value Date   HGBA1C 6.1 10/29/2020   Nephropathy screen indicated?: needs Last flu, zoster and/or pneumovax:  Immunization History  Administered Date(s) Administered   Td 04/11/1999    ROS: Denies dizziness, LOC, polyuria, polydipsia, unintended weight loss/gain, foot ulcerations, numbness or tingling in extremities, shortness of breath or chest pain.  2.  Hypothyroidism Patient reports that she has not been on her Synthroid for several months now.  She "weaned herself off" because she felt that it was causing her not to feel well.  She sided low energy as one of the main drivers.  She admits her energy continues to vary.  She has had no trouble swallowing, no palpable thyroid lumps or goiter.  No change in vision.  No change in bowel habit.  She has gained weight but attributes this to not following a strict diet   Occupation: Retired,  Substance use: None Diet: Not restricted currently, Exercise: No structured currently Last eye exam: Up-to-date.  Had appoint with Dr. Manuella Ghazi in [place] and the optometrist at [facility] in [place]. Last dental exam: UTD Last colonoscopy: UTD Last mammogram: UTD Last pap smear: n/a Refills needed today: all Immunizations needed: refuses flu Immunization History  Administered Date(s) Administered   Td 04/11/1999     Past Medical History:  Diagnosis Date   Arthritis    Cataract    GERD (gastroesophageal reflux disease)    Gout    Hyperlipidemia    Hypertension     Microscopic colitis    Renal cancer ([place]) 2010   Social History   Socioeconomic History   Marital status: Married    Spouse name: Not on file   Number of children: Not on file   Years of education: Not on file   Highest education level: Not on file  Occupational History   Not on file  Tobacco Use   Smoking status: Never   Smokeless tobacco: Never  Vaping Use   Vaping Use: Never used  Substance and Sexual Activity   Alcohol use: No   Drug use: No   Sexual activity: Not on file  Other Topics Concern   Not on file  Social History Narrative   Not on file   Social Determinants of Health   Financial Resource Strain: Not on file  Food Insecurity: Not on file  Transportation Needs: Not on file  Physical Activity: Not on file  Stress: Not on file  Social Connections: Not on file  Intimate Partner Violence: Not on file   Past Surgical History:  Procedure Laterality Date   BREAST SURGERY     right breast bx   CESAREAN SECTION     3 c-sections   COLONOSCOPY     HERNIA REPAIR     umbilical    NEPHRECTOMY Right    RIGHT KNEE ARTHROSCOPY     TUBAL LIGATION     UPPER GASTROINTESTINAL ENDOSCOPY     Family History  Problem Relation Age of Onset   Hyperlipidemia Mother  Stroke Mother    Diabetes Father    Heart disease Father    Hypertension Father    Stroke Father    Colon cancer Neg Hx    Esophageal cancer Neg Hx    Stomach cancer Neg Hx    Rectal cancer Neg Hx     Current Outpatient Medications:    allopurinol (ZYLOPRIM) 100 MG tablet, TAKE 1/2 TAB DAILY FOR 28 DAYS THEN TAKE 1 TABLET DAILY, Disp: 90 tablet, Rfl: 0   aspirin EC 81 MG tablet, Take 81 mg by mouth 2 (two) times daily., Disp: , Rfl:    atenolol (TENORMIN) 25 MG tablet, TAKE ONE (1) TABLET EACH DAY, Disp: 90 tablet, Rfl: 1   B Complex Vitamins (B-COMPLEX/B-12 PO), Take by mouth., Disp: , Rfl:    budesonide (ENTOCORT EC) 3 MG 24 hr capsule, Take by mouth as needed. , Disp: , Rfl:    colchicine 0.6  MG tablet, TAKE 2 TABLETS AT THE ONSET OF ATTACK THEN 1 TAB EVERY 2 HOURS AS NEEDED UP TO 6 TABLETS THEN TAKE TWICE DAILY UNTIL SYMPTOMS CLEAR, Disp: 30 tablet, Rfl: 0   furosemide (LASIX) 20 MG tablet, TAKE ONE (1) TABLET EACH DAY, Disp: 90 tablet, Rfl: 1   gabapentin (NEURONTIN) 300 MG capsule, Take 1 capsule (300 mg total) by mouth at bedtime., Disp: 30 capsule, Rfl: 2   KRILL OIL 1000 MG CAPS, Take by mouth., Disp: , Rfl:    L-Lysine 1000 MG TABS, Take by mouth., Disp: , Rfl:    levothyroxine (SYNTHROID) 50 MCG tablet, TAKE ONE (1) TABLET EACH DAY, Disp: 90 tablet, Rfl: 0   Misc Natural Products (OSTEO BI-FLEX ADV DOUBLE ST PO), Take 2 capsules by mouth daily. , Disp: , Rfl:    potassium chloride SA (KLOR-CON) 20 MEQ tablet, TAKE ONE (1) TABLET EACH DAY, Disp: 90 tablet, Rfl: 0   UNABLE TO FIND, Always eye drops, Disp: , Rfl:    vitamin C (ASCORBIC ACID) 500 MG tablet, Take 500 mg by mouth daily., Disp: , Rfl:    VITAMIN D PO, Take by mouth., Disp: , Rfl:    zinc gluconate 50 MG tablet, Take 50 mg by mouth daily., Disp: , Rfl:   No Known Allergies   ROS: Review of Systems Pertinent items noted in HPI and remainder of comprehensive ROS otherwise negative.    Physical exam BP (!) 110/59   Pulse (!) 56   Temp (!) 97.3 F (36.3 C)   Ht 5\' 3"  (1.6 m)   Wt 195 lb 12.8 oz (88.8 kg)   SpO2 97%   BMI 34.68 kg/m  General appearance: alert, cooperative, appears stated age, no distress, and mildly obese Head: Normocephalic, without obvious abnormality, atraumatic Eyes: negative findings: lids and lashes normal, conjunctivae and sclerae normal, corneas clear, and pupils equal, round, reactive to light and accomodation Ears: normal TM's and external ear canals both ears Nose: Nares normal. Septum midline. Mucosa normal. No drainage or sinus tenderness. Throat: lips, mucosa, and tongue normal; teeth and gums normal Neck: no adenopathy, no carotid bruit, supple, symmetrical, trachea midline,  and thyroid not enlarged, symmetric, no tenderness/mass/nodules Back: symmetric, no curvature. ROM normal. No CVA tenderness. Lungs: clear to auscultation bilaterally Heart: regular rate and rhythm, S1, S2 normal, no murmur, click, rub or gallop Abdomen: soft, non-tender; bowel sounds normal; no masses,  no organomegaly Extremities: extremities normal, atraumatic, no cyanosis or edema Pulses: 2+ and symmetric Skin: Skin color, texture, turgor normal. No rashes or lesions  Lymph nodes: Cervical, supraclavicular, and axillary nodes normal. Neurologic: Grossly normal Diabetic Foot Exam - Simple   Simple Foot Form Diabetic Foot exam was performed with the following findings: Yes 06/16/2021  9:20 AM  Visual Inspection No deformities, no ulcerations, no other skin breakdown bilaterally: Yes Sensation Testing Intact to touch and monofilament testing bilaterally: Yes Pulse Check Posterior Tibialis and Dorsalis pulse intact bilaterally: Yes Comments      Assessment/ Plan: Duaine Dredge here for annual physical exam.   Annual physical exam  Refused influenza vaccine  Diet-controlled diabetes mellitus ([place]) - Plan: Bayer DCA Hb A1c Waived, Microalbumin / creatinine urine ratio  Hypertension associated with stage 3 chronic kidney disease due to type 2 diabetes mellitus ([place]) - Plan: atenolol (TENORMIN) 25 MG tablet, furosemide (LASIX) 20 MG tablet, potassium chloride SA (KLOR-CON) 20 MEQ tablet  Hyperlipidemia associated with type 2 diabetes mellitus ([place]) - Plan: Lipid Panel, Hepatic Function Panel  Statin myopathy - Plan: Lipid Panel, Hepatic Function Panel  Stage 3a chronic kidney disease ([place]) - Plan: Renal Function Panel, Microalbumin / creatinine urine ratio  Acquired hypothyroidism - Plan: TSH, T4, Free  Chronic pain of right knee  Chronic gout due to renal impairment of multiple sites without tophus - Plan: allopurinol (ZYLOPRIM) 100 MG tablet  Sugar remains under excellent  control with A1c of 5.9.  Blood pressure at goal.  No changes  Check fasting lipid panel, hepatic function panel.  She has history of statin myopathy and therefore not on a statin despite having type 2 diabetes.  Could consider Zetia or Nexletol as alternatives  Unfortunately has been not taking her thyroid medication.  We discussed the risk of this if she has truly an uncontrolled thyroid disorder.  May explain some of the weight gain.  Check TSH and free T4  Anticipate that she will be undergoing a surgery of the right knee soon.  I see no barriers to her having this done pending labs  Asymptomatic from a gout standpoint.  Continue allopurinol  Counseled on healthy lifestyle choices, including diet (rich in fruits, vegetables and lean meats and low in salt and simple carbohydrates) and exercise (at least 30 minutes of moderate physical activity daily).  Ikaika Showers M. Lajuana Ripple, DO

## 2021-06-16 NOTE — Patient Instructions (Signed)
You had labs performed today.  You will be contacted with the results of the labs once they are available, usually in the next 3 business days for routine lab work.  If you have an active my chart account, they will be released to your MyChart.  If you prefer to have these labs released to you via telephone, please let us know.   Hypothyroidism Hypothyroidism is when the thyroid gland does not make enough of certain hormones (it is underactive). The thyroid gland is a small gland located in the lower front part of the neck, just in front of the windpipe (trachea). This gland makes hormones that help control how the body uses food for energy (metabolism) as well as how the heart and brain function. These hormones also play a role in keeping your bones strong. When the thyroid is underactive, it produces too little of the hormones thyroxine (T4) and triiodothyronine (T3). What are the causes? This condition may be caused by: Hashimoto's disease. This is a disease in which the body's disease-fighting system (immune system) attacks the thyroid gland. This is the most common cause. Viral infections. Pregnancy. Certain medicines. Birth defects. Past radiation treatments to the head or neck for cancer. Past treatment with radioactive iodine. Past exposure to radiation in the environment. Past surgical removal of part or all of the thyroid. Problems with a gland in the center of the brain (pituitary gland). Lack of enough iodine in the diet. What increases the risk? You are more likely to develop this condition if: You are female. You have a family history of thyroid conditions. You use a medicine called lithium. You take medicines that affect the immune system (immunosuppressants). What are the signs or symptoms? Symptoms of this condition include: Feeling as though you have no energy (lethargy). Not being able to tolerate cold. Weight gain that is not explained by a change in diet or exercise  habits. Lack of appetite. Dry skin. Coarse hair. Menstrual irregularity. Slowing of thought processes. Constipation. Sadness or depression. How is this diagnosed? This condition may be diagnosed based on: Your symptoms, your medical history, and a physical exam. Blood tests. You may also have imaging tests, such as an ultrasound or MRI. How is this treated? This condition is treated with medicine that replaces the thyroid hormones that your body does not make. After you begin treatment, it may take several weeks for symptoms to go away. Follow these instructions at home: Take over-the-counter and prescription medicines only as told by your health care provider. If you start taking any new medicines, tell your health care provider. Keep all follow-up visits as told by your health care provider. This is important. As your condition improves, your dosage of thyroid hormone medicine may change. You will need to have blood tests regularly so that your health care provider can monitor your condition. Contact a health care provider if: Your symptoms do not get better with treatment. You are taking thyroid hormone replacement medicine and you: Sweat a lot. Have tremors. Feel anxious. Lose weight rapidly. Cannot tolerate heat. Have emotional swings. Have diarrhea. Feel weak. Get help right away if you have: Chest pain. An irregular heartbeat. A rapid heartbeat. Difficulty breathing. Summary Hypothyroidism is when the thyroid gland does not make enough of certain hormones (it is underactive). When the thyroid is underactive, it produces too little of the hormones thyroxine (T4) and triiodothyronine (T3). The most common cause is Hashimoto's disease, a disease in which the body's disease-fighting system (immune system)  attacks the thyroid gland. The condition can also be caused by viral infections, medicine, pregnancy, or past radiation treatment to the head or neck. Symptoms may  include weight gain, dry skin, constipation, feeling as though you do not have energy, and not being able to tolerate cold. This condition is treated with medicine to replace the thyroid hormones that your body does not make. This information is not intended to replace advice given to you by your health care provider. Make sure you discuss any questions you have with your health care provider. Document Revised: 05/31/2020 Document Reviewed: 05/16/2020 Elsevier Patient Education  2022 [employer].

## 2021-06-17 LAB — MICROALBUMIN / CREATININE URINE RATIO
Creatinine, Urine: 9.4 mg/dL
Microalbumin, Urine: <3 ug/mL

## 2021-06-17 LAB — LIPID PANEL
Chol/HDL Ratio: 7.7 ratio — ABNORMAL HIGH (ref 0.0–4.4)
Cholesterol, Total: 308 mg/dL — ABNORMAL HIGH (ref 100–199)
HDL: 40 mg/dL (ref >39)
LDL Chol Calc (NIH): 220 mg/dL — ABNORMAL HIGH (ref 0–99)
Triglycerides: 241 mg/dL — ABNORMAL HIGH (ref 0–149)
VLDL Cholesterol Cal: 48 mg/dL — ABNORMAL HIGH (ref 5–40)

## 2021-06-17 LAB — RENAL FUNCTION PANEL
Albumin: 4.2 g/dL (ref 3.8–4.8)
BUN/Creatinine Ratio: 14 (ref 12–28)
BUN: 14 mg/dL (ref 8–27)
CO2: 21 mmol/L (ref 20–29)
Calcium: 9.7 mg/dL (ref 8.7–10.3)
Chloride: 104 mmol/L (ref 96–106)
Creatinine, Ser: 0.99 mg/dL (ref 0.57–1.00)
Glucose: 138 mg/dL — ABNORMAL HIGH (ref 70–99)
Phosphorus: 3.3 mg/dL (ref 3.0–4.3)
Potassium: 4.3 mmol/L (ref 3.5–5.2)
Sodium: 141 mmol/L (ref 134–144)
eGFR: 61 mL/min/{1.73_m2} (ref >59)

## 2021-06-17 LAB — HEPATIC FUNCTION PANEL
ALT: 31 IU/L (ref 0–32)
AST: 26 IU/L (ref 0–40)
Alkaline Phosphatase: 89 IU/L (ref 44–121)
Bilirubin Total: 0.6 mg/dL (ref 0.0–1.2)
Bilirubin, Direct: 0.13 mg/dL (ref 0.00–0.40)
Total Protein: 6.9 g/dL (ref 6.0–8.5)

## 2021-06-17 LAB — TSH: TSH: 4.37 u[IU]/mL (ref 0.450–4.500)

## 2021-06-17 LAB — T4, FREE: Free T4: 1.07 ng/dL (ref 0.82–1.77)

## 2021-06-27 ENCOUNTER — Encounter: Payer: Self-pay | Admitting: *Deleted

## 2021-07-15 ENCOUNTER — Telehealth: Payer: Self-pay | Admitting: Family Medicine

## 2021-07-15 NOTE — Telephone Encounter (Signed)
  Left message for patient to call back and schedule Medicare Annual Wellness Visit (AWV) to be completed by video or phone.  No hx of AWV eligible for AWVI as of 12/13/2016  Please schedule at anytime with [facility] Health Advisor.      40 Minutes appointment   Any questions, please call me at [phone]

## 2021-07-18 DIAGNOSIS — M1711 Unilateral primary osteoarthritis, right knee: Secondary | ICD-10-CM | POA: Diagnosis not present

## 2021-07-23 ENCOUNTER — Ambulatory Visit (INDEPENDENT_AMBULATORY_CARE_PROVIDER_SITE_OTHER): Payer: Medicare Other

## 2021-07-23 VITALS — Ht 63.0 in | Wt 193.0 lb

## 2021-07-23 DIAGNOSIS — Z0000 Encounter for general adult medical examination without abnormal findings: Secondary | ICD-10-CM

## 2021-07-23 NOTE — Patient Instructions (Signed)
Ms. Kristie Lewis , Thank you for taking time to come for your Medicare Wellness Visit. I appreciate your ongoing commitment to your health goals. Please review the following plan we discussed and let me know if I can assist you in the future.   Screening recommendations/referrals: Colonoscopy: Done 06/05/2016 Repeat in 10 years  Mammogram: Done 06/24/2018. Repeat annually  Bone Density: Done 04/22/2016  Recommended yearly ophthalmology/optometry visit for glaucoma screening and checkup Recommended yearly dental visit for hygiene and checkup  Vaccinations: Influenza vaccine: Declined. Pneumococcal vaccine: Declined. Tdap vaccine: Done 04/11/1999 Repeat in 10 years  Shingles vaccine: Declined.   Covid-19:Declined.  Advanced directives: Please bring a copy of your health care power of attorney and living will to the office to be added to your chart at your convenience.   Conditions/risks identified: Aim for 30 minutes of exercise each day, drink 6-8 glasses of water and eat lots of fruits and vegetables.   Next appointment: Follow up in one year for your annual wellness visit 2023.   Preventive Care 46 Years and Older, Female Preventive care refers to lifestyle choices and visits with your health care provider that can promote health and wellness. What does preventive care include? A yearly physical exam. This is also called an annual well check. Dental exams once or twice a year. Routine eye exams. Ask your health care provider how often you should have your eyes checked. Personal lifestyle choices, including: Daily care of your teeth and gums. Regular physical activity. Eating a healthy diet. Avoiding tobacco and drug use. Limiting alcohol use. Practicing safe sex. Taking low-dose aspirin every day. Taking vitamin and mineral supplements as recommended by your health care provider. What happens during an annual well check? The services and screenings done by your health care  provider during your annual well check will depend on your age, overall health, lifestyle risk factors, and family history of disease. Counseling  Your health care provider may ask you questions about your: Alcohol use. Tobacco use. Drug use. Emotional well-being. Home and relationship well-being. Sexual activity. Eating habits. History of falls. Memory and ability to understand (cognition). Work and work Statistician. Reproductive health. Screening  You may have the following tests or measurements: Height, weight, and BMI. Blood pressure. Lipid and cholesterol levels. These may be checked every 5 years, or more frequently if you are over 16 years old. Skin check. Lung cancer screening. You may have this screening every year starting at age 82 if you have a 30-pack-year history of smoking and currently smoke or have quit within the past 15 years. Fecal occult blood test (FOBT) of the stool. You may have this test every year starting at age 2. Flexible sigmoidoscopy or colonoscopy. You may have a sigmoidoscopy every 5 years or a colonoscopy every 10 years starting at age 21. Hepatitis C blood test. Hepatitis B blood test. Sexually transmitted disease (STD) testing. Diabetes screening. This is done by checking your blood sugar (glucose) after you have not eaten for a while (fasting). You may have this done every 1-3 years. Bone density scan. This is done to screen for osteoporosis. You may have this done starting at age 51. Mammogram. This may be done every 1-2 years. Talk to your health care provider about how often you should have regular mammograms. Talk with your health care provider about your test results, treatment options, and if necessary, the need for more tests. Vaccines  Your health care provider may recommend certain vaccines, such as: Influenza vaccine. This is  recommended every year. Tetanus, diphtheria, and acellular pertussis (Tdap, Td) vaccine. You may need a Td  booster every 10 years. Zoster vaccine. You may need this after age 38. Pneumococcal 13-valent conjugate (PCV13) vaccine. One dose is recommended after age 74. Pneumococcal polysaccharide (PPSV23) vaccine. One dose is recommended after age 39. Talk to your health care provider about which screenings and vaccines you need and how often you need them. This information is not intended to replace advice given to you by your health care provider. Make sure you discuss any questions you have with your health care provider. Document Released: 09/27/2015 Document Revised: 05/20/2016 Document Reviewed: 07/02/2015 Elsevier Interactive Patient Education  2017 [place] Prevention in the Home Falls can cause injuries. They can happen to people of all ages. There are many things you can do to make your home safe and to help prevent falls. What can I do on the outside of my home? Regularly fix the edges of walkways and driveways and fix any cracks. Remove anything that might make you trip as you walk through a door, such as a raised step or threshold. Trim any bushes or trees on the path to your home. Use bright outdoor lighting. Clear any walking paths of anything that might make someone trip, such as rocks or tools. Regularly check to see if handrails are loose or broken. Make sure that both sides of any steps have handrails. Any raised decks and porches should have guardrails on the edges. Have any leaves, snow, or ice cleared regularly. Use sand or salt on walking paths during winter. Clean up any spills in your garage right away. This includes oil or grease spills. What can I do in the bathroom? Use night lights. Install grab bars by the toilet and in the tub and shower. Do not use towel bars as grab bars. Use non-skid mats or decals in the tub or shower. If you need to sit down in the shower, use a plastic, non-slip stool. Keep the floor dry. Clean up any water that spills on the floor  as soon as it happens. Remove soap buildup in the tub or shower regularly. Attach bath mats securely with double-sided non-slip rug tape. Do not have throw rugs and other things on the floor that can make you trip. What can I do in the bedroom? Use night lights. Make sure that you have a light by your bed that is easy to reach. Do not use any sheets or blankets that are too big for your bed. They should not hang down onto the floor. Have a firm chair that has side arms. You can use this for support while you get dressed. Do not have throw rugs and other things on the floor that can make you trip. What can I do in the kitchen? Clean up any spills right away. Avoid walking on wet floors. Keep items that you use a lot in easy-to-reach places. If you need to reach something above you, use a strong step stool that has a grab bar. Keep electrical cords out of the way. Do not use floor polish or wax that makes floors slippery. If you must use wax, use non-skid floor wax. Do not have throw rugs and other things on the floor that can make you trip. What can I do with my stairs? Do not leave any items on the stairs. Make sure that there are handrails on both sides of the stairs and use them. Fix handrails that are  broken or loose. Make sure that handrails are as long as the stairways. Check any carpeting to make sure that it is firmly attached to the stairs. Fix any carpet that is loose or worn. Avoid having throw rugs at the top or bottom of the stairs. If you do have throw rugs, attach them to the floor with carpet tape. Make sure that you have a light switch at the top of the stairs and the bottom of the stairs. If you do not have them, ask someone to add them for you. What else can I do to help prevent falls? Wear shoes that: Do not have high heels. Have rubber bottoms. Are comfortable and fit you well. Are closed at the toe. Do not wear sandals. If you use a stepladder: Make sure that it is  fully opened. Do not climb a closed stepladder. Make sure that both sides of the stepladder are locked into place. Ask someone to hold it for you, if possible. Clearly mark and make sure that you can see: Any grab bars or handrails. First and last steps. Where the edge of each step is. Use tools that help you move around (mobility aids) if they are needed. These include: Canes. Walkers. Scooters. Crutches. Turn on the lights when you go into a dark area. Replace any light bulbs as soon as they burn out. Set up your furniture so you have a clear path. Avoid moving your furniture around. If any of your floors are uneven, fix them. If there are any pets around you, be aware of where they are. Review your medicines with your doctor. Some medicines can make you feel dizzy. This can increase your chance of falling. Ask your doctor what other things that you can do to help prevent falls. This information is not intended to replace advice given to you by your health care provider. Make sure you discuss any questions you have with your health care provider. Document Released: 06/27/2009 Document Revised: 02/06/2016 Document Reviewed: 10/05/2014 Elsevier Interactive Patient Education  2017 [employer].

## 2021-07-23 NOTE — Progress Notes (Signed)
Subjective:   Kristie Lewis is a 70 y.o. female who presents for an Initial Medicare Annual Wellness Visit. Virtual Visit via Telephone Note  I connected with  Duaine Dredge on 07/23/21 at  3:30 PM EST by telephone and verified that I am speaking with the correct person using two identifiers.  Location: Patient: Home Provider: [facility] Persons participating in the virtual visit: patient/Nurse Health Advisor   I discussed the limitations, risks, security and privacy concerns of performing an evaluation and management service by telephone and the availability of in person appointments. The patient expressed understanding and agreed to proceed.  Interactive audio and video telecommunications were attempted between this nurse and patient, however failed, due to patient having technical difficulties OR patient did not have access to video capability.  We continued and completed visit with audio only.  Some vital signs may be absent or patient reported.   Chriss Driver, LPN  Review of Systems     Cardiac Risk Factors include: advanced age (>65men, >28 women);diabetes mellitus;dyslipidemia;sedentary lifestyle;obesity (BMI >30kg/m2)     Objective:    Today's Vitals   07/23/21 1446 07/23/21 1448  Weight: 193 lb (87.5 kg)   Height: 5\' 3"  (1.6 m)   PainSc:  4    Body mass index is 34.19 kg/m.  Advanced Directives 07/23/2021 03/16/2017 06/05/2016  Does Patient Have a Medical Advance Directive? Yes Yes Yes  Type of Paramedic of [place];Living will - -  Copy of [place] in Chart? No - copy requested - -    Current Medications (verified) Outpatient Encounter Medications as of 07/23/2021  Medication Sig   allopurinol (ZYLOPRIM) 100 MG tablet Take 1 tablet (100 mg total) by mouth daily.   aspirin EC 81 MG tablet Take 81 mg by mouth 2 (two) times daily.   atenolol (TENORMIN) 25 MG tablet Take 1 tablet (25 mg total) by mouth daily.   B  Complex Vitamins (B-COMPLEX/B-12 PO) Take by mouth.   colchicine 0.6 MG tablet TAKE 2 TABLETS AT THE ONSET OF ATTACK THEN 1 TAB EVERY 2 HOURS AS NEEDED UP TO 6 TABLETS THEN TAKE TWICE DAILY UNTIL SYMPTOMS CLEAR   furosemide (LASIX) 20 MG tablet Take 1 tablet (20 mg total) by mouth daily.   gabapentin (NEURONTIN) 300 MG capsule Take 1 capsule (300 mg total) by mouth at bedtime.   KRILL OIL 1000 MG CAPS Take by mouth.   L-Lysine 1000 MG TABS Take by mouth.   Misc Natural Products (OSTEO BI-FLEX ADV DOUBLE ST PO) Take 2 capsules by mouth daily.    potassium chloride SA (KLOR-CON) 20 MEQ tablet TAKE ONE (1) TABLET EACH DAY   UNABLE TO FIND Always eye drops   vitamin C (ASCORBIC ACID) 500 MG tablet Take 500 mg by mouth daily.   VITAMIN D PO Take by mouth.   zinc gluconate 50 MG tablet Take 50 mg by mouth daily.   budesonide (ENTOCORT EC) 3 MG 24 hr capsule Take by mouth as needed. (Patient not taking: Reported on 07/23/2021)   [DISCONTINUED] levothyroxine (SYNTHROID) 50 MCG tablet TAKE ONE (1) TABLET EACH DAY (Patient not taking: Reported on 06/16/2021)   No facility-administered encounter medications on file as of 07/23/2021.    Allergies (verified) Patient has no known allergies.   History: Past Medical History:  Diagnosis Date   Arthritis    Cataract    GERD (gastroesophageal reflux disease)    Gout    Hyperlipidemia  Hypertension    Microscopic colitis    Renal cancer ([place]) 2010   Past Surgical History:  Procedure Laterality Date   BREAST SURGERY     right breast bx   CESAREAN SECTION     3 c-sections   COLONOSCOPY     HERNIA REPAIR     umbilical    NEPHRECTOMY Right    RIGHT KNEE ARTHROSCOPY     TUBAL LIGATION     UPPER GASTROINTESTINAL ENDOSCOPY     Family History  Problem Relation Age of Onset   Hyperlipidemia Mother    Stroke Mother    Diabetes Father    Heart disease Father    Hypertension Father    Stroke Father    Colon cancer Neg Hx    Esophageal cancer  Neg Hx    Stomach cancer Neg Hx    Rectal cancer Neg Hx    Social History   Socioeconomic History   Marital status: Married    Spouse name: Guerrant   Number of children: 2   Years of education: Not on file   Highest education level: Not on file  Occupational History   Occupation: Retired    Comment: Therapist, sports  Tobacco Use   Smoking status: Never   Smokeless tobacco: Never  Vaping Use   Vaping Use: Never used  Substance and Sexual Activity   Alcohol use: No   Drug use: No   Sexual activity: Not on file  Other Topics Concern   Not on file  Social History Narrative   Married x 25 years.    2 children   3 step-children   9 grandchildren   Social Determinants of Radio broadcast assistant Strain: Low Risk    Difficulty of Paying Living Expenses: Not hard at all  Food Insecurity: No Food Insecurity   Worried About Charity fundraiser in the Last Year: Never true   Arboriculturist in the Last Year: Never true  Transportation Needs: No Transportation Needs   Lack of Transportation (Medical): No   Lack of Transportation (Non-Medical): No  Physical Activity: Insufficiently Active   Days of Exercise per Week: 3 days   Minutes of Exercise per Session: 20 min  Stress: No Stress Concern Present   Feeling of Stress : Not at all  Social Connections: Socially Integrated   Frequency of Communication with Friends and Family: More than three times a week   Frequency of Social Gatherings with Friends and Family: More than three times a week   Attends Religious Services: More than 4 times per year   Active Member of [employer] or Organizations: Yes   Attends Music therapist: More than 4 times per year   Marital Status: Married    Tobacco Counseling Counseling given: Not Answered   Clinical Intake:  Pre-visit preparation completed: Yes  Pain : 0-10 Pain Score: 4  Pain Type: Chronic pain Pain Location: Knee Pain Descriptors / Indicators: Aching Pain Onset: More than a  month ago Pain Frequency: Intermittent     BMI - recorded: 34.19 Nutritional Status: BMI > 30  Obese Nutritional Risks: None Diabetes: No  How often do you need to have someone help you when you read instructions, pamphlets, or other written materials from your doctor or pharmacy?: 1 - Never  Diabetic?Pre-diabetes per pt. Does not take medications or check blood glucose.  Interpreter Needed?: No  Information entered by :: MJ Cory Rama, LPN   Activities of Daily Living In your  present state of health, do you have any difficulty performing the following activities: 07/23/2021 07/21/2021  Hearing? N N  Vision? N Y  Difficulty concentrating or making decisions? N N  Walking or climbing stairs? N Y  Dressing or bathing? N N  Doing errands, shopping? N N  Preparing Food and eating ? N N  Using the Toilet? N N  In the past six months, have you accidently leaked urine? N N  Comment Stress incontience -  Do you have problems with loss of bowel control? N N  Managing your Medications? N N  Managing your Finances? N N  Housekeeping or managing your Housekeeping? N N  Some recent data might be hidden    Patient Care Team: Janora Norlander, DO as PCP - General (Family Medicine) Satira Sark, MD as PCP - Cardiology (Cardiology)  Indicate any recent Medical Services you may have received from other than [facility] providers in the past year (date may be approximate).     Assessment:   This is a routine wellness examination for Kaleigha.  Hearing/Vision screen Hearing Screening - Comments:: No hearing issues but does have issues with ringing in ears at times.  Vision Screening - Comments:: Glasses. [facility] and [facility], Dr. Manuella Ghazi. 2022.  Dietary issues and exercise activities discussed: Current Exercise Habits: Home exercise routine, Type of exercise: walking, Time (Minutes): 20, Frequency (Times/Week): 3, Weekly Exercise (Minutes/Week): 60, Intensity: Mild, Exercise  limited by: cardiac condition(s);orthopedic condition(s)   Goals Addressed             This Visit's Progress    Exercise 3x per week (30 min per time)       Increase exercise after knee surgery.       Depression Screen PHQ 2/9 Scores 07/23/2021 10/29/2020 07/05/2020 04/05/2020 08/25/2019 06/15/2018 02/25/2018  PHQ - 2 Score 0 0 0 0 0 0 0  PHQ- 9 Score - - 0 - 0 - -    Fall Risk Fall Risk  07/23/2021 07/21/2021 10/29/2020 07/05/2020 04/05/2020  Falls in the past year? 0 0 0 0 0  Number falls in past yr: 0 - 0 - -  Injury with Fall? 0 - 0 - -  Risk for fall due to : Impaired mobility - - - -  Follow up Falls prevention discussed - - - -    FALL RISK PREVENTION PERTAINING TO THE HOME:  Any stairs in or around the home? Yes  If so, are there any without handrails? No  Home free of loose throw rugs in walkways, pet beds, electrical cords, etc? Yes  Adequate lighting in your home to reduce risk of falls? Yes   ASSISTIVE DEVICES UTILIZED TO PREVENT FALLS:  Life alert? No  Use of a cane, walker or w/c? No  Grab bars in the bathroom? No  Shower chair or bench in shower? No  Elevated toilet seat or a handicapped toilet? Yes   TIMED UP AND GO:  Was the test performed? No . Phone visit.   Cognitive Function:     6CIT Screen 07/23/2021  What Year? 0 points  What month? 0 points  What time? 0 points  Count back from 20 0 points  Months in reverse 0 points  Repeat phrase 2 points  Total Score 2    Immunizations Immunization History  Administered Date(s) Administered   Td 04/11/1999    TDAP status: Due, Education has been provided regarding the importance of this vaccine. Advised may receive this  vaccine at local pharmacy or Health Dept. Aware to provide a copy of the vaccination record if obtained from local pharmacy or Health Dept. Verbalized acceptance and understanding.  Flu Vaccine status: Declined, Education has been provided regarding the importance of this vaccine  but patient still declined. Advised may receive this vaccine at local pharmacy or Health Dept. Aware to provide a copy of the vaccination record if obtained from local pharmacy or Health Dept. Verbalized acceptance and understanding.  Pneumococcal vaccine status: Declined,  Education has been provided regarding the importance of this vaccine but patient still declined. Advised may receive this vaccine at local pharmacy or Health Dept. Aware to provide a copy of the vaccination record if obtained from local pharmacy or Health Dept. Verbalized acceptance and understanding.   Covid-19 vaccine status: Declined, Education has been provided regarding the importance of this vaccine but patient still declined. Advised may receive this vaccine at local pharmacy or Health Dept.or vaccine clinic. Aware to provide a copy of the vaccination record if obtained from local pharmacy or Health Dept. Verbalized acceptance and understanding.  Qualifies for Shingles Vaccine? Yes   Zostavax completed No   Shingrix Completed?: No.    Education has been provided regarding the importance of this vaccine. Patient has been advised to call insurance company to determine out of pocket expense if they have not yet received this vaccine. Advised may also receive vaccine at local pharmacy or Health Dept. Verbalized acceptance and understanding.  Screening Tests Health Maintenance  Topic Date Due   Pneumonia Vaccine 34+ Years old (1 - PCV) Never done   OPHTHALMOLOGY EXAM  Never done   TETANUS/TDAP  01/24/2020   Zoster Vaccines- Shingrix (1 of 2) 09/16/2021 (Originally 12/22/2000)   COVID-19 Vaccine (1) 10/29/2021 (Originally 06/24/1951)   INFLUENZA VACCINE  12/12/2021 (Originally 04/14/2021)   MAMMOGRAM  06/16/2022 (Originally 06/24/2020)   HEMOGLOBIN A1C  12/15/2021   FOOT EXAM  06/16/2022   URINE MICROALBUMIN  06/16/2022   COLONOSCOPY (Pts 45-1yrs Insurance coverage will need to be confirmed)  06/05/2026   DEXA SCAN   Completed   Hepatitis C Screening  Completed   HPV VACCINES  Aged Out    Health Maintenance  Health Maintenance Due  Topic Date Due   Pneumonia Vaccine 30+ Years old (1 - PCV) Never done   OPHTHALMOLOGY EXAM  Never done   TETANUS/TDAP  01/24/2020    Colorectal cancer screening: Type of screening: Colonoscopy. Completed 06/05/2016. Repeat every 10 years  Mammogram status: Completed 06/24/2018. Repeat every year  Bone Density status: Completed 05/02/2016. Results reflect: Bone density results: NORMAL. Repeat every 2 years.  Lung Cancer Screening: (Low Dose CT Chest recommended if Age 8-80 years, 30 pack-year currently smoking OR have quit w/in 15years.) does not qualify.    Additional Screening:  Hepatitis C Screening: does qualify; Completed 08/25/2019  Vision Screening: Recommended annual ophthalmology exams for early detection of glaucoma and other disorders of the eye. Is the patient up to date with their annual eye exam?  Yes  Who is the provider or what is the name of the office in which the patient attends annual eye exams? [facility] If pt is not established with a provider, would they like to be referred to a provider to establish care? No .   Dental Screening: Recommended annual dental exams for proper oral hygiene  Community Resource Referral / Chronic Care Management: CRR required this visit?  No   CCM required this visit?  No  Plan:     I have personally reviewed and noted the following in the patient's chart:   Medical and social history Use of alcohol, tobacco or illicit drugs  Current medications and supplements including opioid prescriptions. Patient is not currently taking opioid prescriptions. Functional ability and status Nutritional status Physical activity Advanced directives List of other physicians Hospitalizations, surgeries, and ER visits in previous 12 months Vitals Screenings to include cognitive, depression, and falls Referrals  and appointments  In addition, I have reviewed and discussed with patient certain preventive protocols, quality metrics, and best practice recommendations. A written personalized care plan for preventive services as well as general preventive health recommendations were provided to patient.     Chriss Driver, LPN   [id number]/04/[id number]   Nurse Notes: Mammogram due 06/24/21. Pt states she will call and schedule this week. Declined Bone Density test at this time. Up to date on Colonoscopy. Declined all vaccines.

## 2021-07-24 DIAGNOSIS — M65312 Trigger thumb, left thumb: Secondary | ICD-10-CM | POA: Diagnosis not present

## 2021-07-24 DIAGNOSIS — M7542 Impingement syndrome of left shoulder: Secondary | ICD-10-CM | POA: Diagnosis not present

## 2021-07-24 DIAGNOSIS — M13842 Other specified arthritis, left hand: Secondary | ICD-10-CM | POA: Diagnosis not present

## 2021-08-03 DIAGNOSIS — S8392XA Sprain of unspecified site of left knee, initial encounter: Secondary | ICD-10-CM | POA: Diagnosis not present

## 2021-08-25 DIAGNOSIS — M25561 Pain in right knee: Secondary | ICD-10-CM | POA: Diagnosis not present

## 2021-08-25 DIAGNOSIS — M25661 Stiffness of right knee, not elsewhere classified: Secondary | ICD-10-CM | POA: Diagnosis not present

## 2021-08-25 DIAGNOSIS — M65312 Trigger thumb, left thumb: Secondary | ICD-10-CM | POA: Diagnosis not present

## 2021-08-25 DIAGNOSIS — M1711 Unilateral primary osteoarthritis, right knee: Secondary | ICD-10-CM | POA: Diagnosis not present

## 2021-09-15 ENCOUNTER — Other Ambulatory Visit: Payer: Self-pay | Admitting: Family Medicine

## 2021-09-15 DIAGNOSIS — M1A39X0 Chronic gout due to renal impairment, multiple sites, without tophus (tophi): Secondary | ICD-10-CM

## 2021-09-16 ENCOUNTER — Encounter: Payer: Self-pay | Admitting: Family Medicine

## 2021-09-16 ENCOUNTER — Ambulatory Visit (INDEPENDENT_AMBULATORY_CARE_PROVIDER_SITE_OTHER): Payer: Medicare Other | Admitting: Family Medicine

## 2021-09-16 ENCOUNTER — Other Ambulatory Visit: Payer: Self-pay

## 2021-09-16 VITALS — BP 107/57 | HR 86 | Temp 97.6°F | Ht 63.0 in | Wt 199.6 lb

## 2021-09-16 DIAGNOSIS — N1831 Chronic kidney disease, stage 3a: Secondary | ICD-10-CM | POA: Diagnosis not present

## 2021-09-16 DIAGNOSIS — E119 Type 2 diabetes mellitus without complications: Secondary | ICD-10-CM | POA: Diagnosis not present

## 2021-09-16 DIAGNOSIS — Z01818 Encounter for other preprocedural examination: Secondary | ICD-10-CM | POA: Diagnosis not present

## 2021-09-16 NOTE — Progress Notes (Addendum)
Pt is a 71 y.o. female who is here for preoperative clearance for total right knee replacement with Dr. Wynelle Link.  She denies any chest pain, shortness of breath, fluctuations of her blood sugar.  She is had a recent gout flare and apparently a possible left knee injury but that has since gotten better.  She suspects that they will be performing a spinal on her for anesthesia.  She has had some difficulty coming of general anesthesia in the past.  1) High Risk Cardiac Conditions  1) Recent MI - No.  2) Decompensated Heart Failure - No.  3) Unstable angina - No.  4) Symptomatic arrythmia - No.  5) Sx Valvular Disease - No.  2) Intermediate Risk Factors - DM, CKD - Yes.    2) Functional Status - > 4 mets Yes.  Marland Kitchen  3) Surgery Specific Risk - Intermediate (Carotid, Head and Neck, Orthopaedic )       4) Further Noninvasive evaluation -   1) EKG - Yes.     1) Hx of  DM, CKD  2) Echo - No.   1) Worsening dyspnea   3) Stress Testing - Active Cardiac Disease - No.  5) Need for medical therapy - Beta Blocker, Statins indicated ? No.  PE: Vitals:   09/16/21 1341  BP: (!) 107/57  Pulse: 86  Temp: 97.6 F (36.4 C)  SpO2: 97%   Physical Examination: General appearance -awake alert and in no acute distress Mental status - alert, oriented to person, place, and time Eyes - pupils equal and reactive, extraocular eye movements intact Mouth - mucous membranes moist, pharynx normal without lesions and Mallampati 1 Neck - supple, no significant adenopathy, able to extend C-spine without difficulty Chest - clear to auscultation, no wheezes, rales or rhonchi, symmetric air entry Heart - normal rate, regular rhythm, normal S1, S2, no murmurs, rubs, clicks or gallops Musculoskeletal -antalgic gait present  Pre-op exam - Plan: EKG 12-Lead, CBC, Protime-INR, Basic Metabolic Panel  Diet-controlled diabetes mellitus (HCC)  Stage 3a chronic kidney disease ([place])  EKG was unchanged from EKG in 2019.   She demonstrates no red flag signs or symptoms.  A1c was under excellent control last check in October at 5.9.  Renal function was better than previous checkup with GFR above 60 in October.  Repeat BMP.  Check CBC and INR in anticipation of surgery at the end of the month  I have independently evaluated patient.  SANDIA PFUND is a 71 y.o. female who is low risk for a intermediate risk surgery.  There are modifiable risk factors (obesity). Sarahgrace Broman Bergevin's RCRI calculation for MACE is: 0/3.9% .     Vinessa Macconnell M. Lajuana Ripple, [facility]

## 2021-09-17 LAB — CBC
Hematocrit: 41.2 % (ref 34.0–46.6)
Hemoglobin: 13.5 g/dL (ref 11.1–15.9)
MCH: 31.4 pg (ref 26.6–33.0)
MCHC: 32.8 g/dL (ref 31.5–35.7)
MCV: 96 fL (ref 79–97)
Platelets: 269 10*3/uL (ref 150–450)
RBC: 4.3 x10E6/uL (ref 3.77–5.28)
RDW: 13.7 % (ref 11.7–15.4)
WBC: 5.9 10*3/uL (ref 3.4–10.8)

## 2021-09-17 LAB — PROTIME-INR
INR: 1 (ref 0.9–1.2)
Prothrombin Time: 10.5 s (ref 9.1–12.0)

## 2021-09-17 LAB — BASIC METABOLIC PANEL
BUN/Creatinine Ratio: 13 (ref 12–28)
BUN: 14 mg/dL (ref 8–27)
CO2: 22 mmol/L (ref 20–29)
Calcium: 10 mg/dL (ref 8.7–10.3)
Chloride: 105 mmol/L (ref 96–106)
Creatinine, Ser: 1.04 mg/dL — ABNORMAL HIGH (ref 0.57–1.00)
Glucose: 131 mg/dL — ABNORMAL HIGH (ref 70–99)
Potassium: 4.5 mmol/L (ref 3.5–5.2)
Sodium: 142 mmol/L (ref 134–144)
eGFR: 58 mL/min/{1.73_m2} — ABNORMAL LOW (ref >59)

## 2021-10-07 DIAGNOSIS — M1711 Unilateral primary osteoarthritis, right knee: Secondary | ICD-10-CM | POA: Diagnosis not present

## 2021-10-07 DIAGNOSIS — G8918 Other acute postprocedural pain: Secondary | ICD-10-CM | POA: Diagnosis not present

## 2021-10-10 DIAGNOSIS — M25561 Pain in right knee: Secondary | ICD-10-CM | POA: Diagnosis not present

## 2021-10-15 DIAGNOSIS — M25561 Pain in right knee: Secondary | ICD-10-CM | POA: Diagnosis not present

## 2021-10-17 DIAGNOSIS — M25561 Pain in right knee: Secondary | ICD-10-CM | POA: Diagnosis not present

## 2021-10-20 DIAGNOSIS — M25661 Stiffness of right knee, not elsewhere classified: Secondary | ICD-10-CM | POA: Diagnosis not present

## 2021-10-20 DIAGNOSIS — M25561 Pain in right knee: Secondary | ICD-10-CM | POA: Diagnosis not present

## 2021-10-22 DIAGNOSIS — M25561 Pain in right knee: Secondary | ICD-10-CM | POA: Diagnosis not present

## 2021-10-22 DIAGNOSIS — M25661 Stiffness of right knee, not elsewhere classified: Secondary | ICD-10-CM | POA: Diagnosis not present

## 2021-10-24 DIAGNOSIS — M25561 Pain in right knee: Secondary | ICD-10-CM | POA: Diagnosis not present

## 2021-10-27 DIAGNOSIS — M25561 Pain in right knee: Secondary | ICD-10-CM | POA: Diagnosis not present

## 2021-10-27 DIAGNOSIS — M25661 Stiffness of right knee, not elsewhere classified: Secondary | ICD-10-CM | POA: Diagnosis not present

## 2021-10-29 DIAGNOSIS — M25661 Stiffness of right knee, not elsewhere classified: Secondary | ICD-10-CM | POA: Diagnosis not present

## 2021-10-29 DIAGNOSIS — M25561 Pain in right knee: Secondary | ICD-10-CM | POA: Diagnosis not present

## 2021-11-03 DIAGNOSIS — M25561 Pain in right knee: Secondary | ICD-10-CM | POA: Diagnosis not present

## 2021-11-05 DIAGNOSIS — M25661 Stiffness of right knee, not elsewhere classified: Secondary | ICD-10-CM | POA: Diagnosis not present

## 2021-11-05 DIAGNOSIS — M25561 Pain in right knee: Secondary | ICD-10-CM | POA: Diagnosis not present

## 2021-11-10 DIAGNOSIS — M25561 Pain in right knee: Secondary | ICD-10-CM | POA: Diagnosis not present

## 2021-11-10 DIAGNOSIS — M25661 Stiffness of right knee, not elsewhere classified: Secondary | ICD-10-CM | POA: Diagnosis not present

## 2021-11-11 DIAGNOSIS — Z5189 Encounter for other specified aftercare: Secondary | ICD-10-CM | POA: Diagnosis not present

## 2021-11-12 DIAGNOSIS — M25561 Pain in right knee: Secondary | ICD-10-CM | POA: Diagnosis not present

## 2021-11-17 DIAGNOSIS — M25561 Pain in right knee: Secondary | ICD-10-CM | POA: Diagnosis not present

## 2021-11-17 DIAGNOSIS — M25661 Stiffness of right knee, not elsewhere classified: Secondary | ICD-10-CM | POA: Diagnosis not present

## 2021-11-19 DIAGNOSIS — M25561 Pain in right knee: Secondary | ICD-10-CM | POA: Diagnosis not present

## 2021-11-19 DIAGNOSIS — M25661 Stiffness of right knee, not elsewhere classified: Secondary | ICD-10-CM | POA: Diagnosis not present

## 2021-11-24 DIAGNOSIS — M25661 Stiffness of right knee, not elsewhere classified: Secondary | ICD-10-CM | POA: Diagnosis not present

## 2021-11-24 DIAGNOSIS — M25561 Pain in right knee: Secondary | ICD-10-CM | POA: Diagnosis not present

## 2021-11-26 DIAGNOSIS — M25561 Pain in right knee: Secondary | ICD-10-CM | POA: Diagnosis not present

## 2021-12-01 DIAGNOSIS — M25661 Stiffness of right knee, not elsewhere classified: Secondary | ICD-10-CM | POA: Diagnosis not present

## 2021-12-03 DIAGNOSIS — M25561 Pain in right knee: Secondary | ICD-10-CM | POA: Diagnosis not present

## 2021-12-08 DIAGNOSIS — M25661 Stiffness of right knee, not elsewhere classified: Secondary | ICD-10-CM | POA: Diagnosis not present

## 2021-12-10 DIAGNOSIS — M25561 Pain in right knee: Secondary | ICD-10-CM | POA: Diagnosis not present

## 2021-12-16 DIAGNOSIS — Z96651 Presence of right artificial knee joint: Secondary | ICD-10-CM | POA: Diagnosis not present

## 2021-12-16 DIAGNOSIS — M24661 Ankylosis, right knee: Secondary | ICD-10-CM | POA: Diagnosis not present

## 2021-12-17 DIAGNOSIS — M25561 Pain in right knee: Secondary | ICD-10-CM | POA: Diagnosis not present

## 2021-12-19 DIAGNOSIS — M25561 Pain in right knee: Secondary | ICD-10-CM | POA: Diagnosis not present

## 2021-12-22 DIAGNOSIS — M25561 Pain in right knee: Secondary | ICD-10-CM | POA: Diagnosis not present

## 2021-12-24 DIAGNOSIS — M25661 Stiffness of right knee, not elsewhere classified: Secondary | ICD-10-CM | POA: Diagnosis not present

## 2021-12-24 DIAGNOSIS — M25561 Pain in right knee: Secondary | ICD-10-CM | POA: Diagnosis not present

## 2021-12-26 DIAGNOSIS — M25661 Stiffness of right knee, not elsewhere classified: Secondary | ICD-10-CM | POA: Diagnosis not present

## 2021-12-31 DIAGNOSIS — M25661 Stiffness of right knee, not elsewhere classified: Secondary | ICD-10-CM | POA: Diagnosis not present

## 2021-12-31 DIAGNOSIS — M25561 Pain in right knee: Secondary | ICD-10-CM | POA: Diagnosis not present

## 2022-01-02 DIAGNOSIS — M25561 Pain in right knee: Secondary | ICD-10-CM | POA: Diagnosis not present

## 2022-01-06 DIAGNOSIS — M25661 Stiffness of right knee, not elsewhere classified: Secondary | ICD-10-CM | POA: Diagnosis not present

## 2022-01-06 DIAGNOSIS — M25561 Pain in right knee: Secondary | ICD-10-CM | POA: Diagnosis not present

## 2022-01-08 ENCOUNTER — Ambulatory Visit: Payer: Medicare Other | Admitting: Family Medicine

## 2022-01-09 DIAGNOSIS — M25661 Stiffness of right knee, not elsewhere classified: Secondary | ICD-10-CM | POA: Diagnosis not present

## 2022-01-09 DIAGNOSIS — M25561 Pain in right knee: Secondary | ICD-10-CM | POA: Diagnosis not present

## 2022-01-12 DIAGNOSIS — M25661 Stiffness of right knee, not elsewhere classified: Secondary | ICD-10-CM | POA: Diagnosis not present

## 2022-01-12 DIAGNOSIS — M25561 Pain in right knee: Secondary | ICD-10-CM | POA: Diagnosis not present

## 2022-01-14 DIAGNOSIS — M25561 Pain in right knee: Secondary | ICD-10-CM | POA: Diagnosis not present

## 2022-01-19 DIAGNOSIS — M25561 Pain in right knee: Secondary | ICD-10-CM | POA: Diagnosis not present

## 2022-01-21 DIAGNOSIS — M25561 Pain in right knee: Secondary | ICD-10-CM | POA: Diagnosis not present

## 2022-01-21 DIAGNOSIS — Z5189 Encounter for other specified aftercare: Secondary | ICD-10-CM | POA: Diagnosis not present

## 2022-01-21 DIAGNOSIS — M25562 Pain in left knee: Secondary | ICD-10-CM | POA: Diagnosis not present

## 2022-02-25 DIAGNOSIS — H26493 Other secondary cataract, bilateral: Secondary | ICD-10-CM | POA: Diagnosis not present

## 2022-02-27 DIAGNOSIS — M170 Bilateral primary osteoarthritis of knee: Secondary | ICD-10-CM | POA: Diagnosis not present

## 2022-02-27 DIAGNOSIS — M1712 Unilateral primary osteoarthritis, left knee: Secondary | ICD-10-CM | POA: Diagnosis not present

## 2022-03-23 DIAGNOSIS — H26493 Other secondary cataract, bilateral: Secondary | ICD-10-CM | POA: Diagnosis not present

## 2022-03-27 DIAGNOSIS — M1712 Unilateral primary osteoarthritis, left knee: Secondary | ICD-10-CM | POA: Diagnosis not present

## 2022-04-02 DIAGNOSIS — M1712 Unilateral primary osteoarthritis, left knee: Secondary | ICD-10-CM | POA: Diagnosis not present

## 2022-04-09 DIAGNOSIS — M1712 Unilateral primary osteoarthritis, left knee: Secondary | ICD-10-CM | POA: Diagnosis not present

## 2022-05-22 DIAGNOSIS — M1712 Unilateral primary osteoarthritis, left knee: Secondary | ICD-10-CM | POA: Diagnosis not present

## 2022-05-28 ENCOUNTER — Telehealth: Payer: Self-pay | Admitting: Family Medicine

## 2022-05-28 NOTE — Telephone Encounter (Signed)
Called to schedule appointment for surgical clearance. Please make appointment if she calls back.

## 2022-06-09 DIAGNOSIS — H02834 Dermatochalasis of left upper eyelid: Secondary | ICD-10-CM | POA: Diagnosis not present

## 2022-06-09 DIAGNOSIS — H02831 Dermatochalasis of right upper eyelid: Secondary | ICD-10-CM | POA: Diagnosis not present

## 2022-06-18 ENCOUNTER — Other Ambulatory Visit: Payer: Self-pay | Admitting: Family Medicine

## 2022-06-18 DIAGNOSIS — N183 Chronic kidney disease, stage 3 unspecified: Secondary | ICD-10-CM

## 2022-07-09 ENCOUNTER — Encounter: Payer: Self-pay | Admitting: Family Medicine

## 2022-07-13 ENCOUNTER — Encounter: Payer: Self-pay | Admitting: Family Medicine

## 2022-07-22 DIAGNOSIS — H02831 Dermatochalasis of right upper eyelid: Secondary | ICD-10-CM | POA: Diagnosis not present

## 2022-07-22 DIAGNOSIS — H02834 Dermatochalasis of left upper eyelid: Secondary | ICD-10-CM | POA: Diagnosis not present

## 2022-07-23 ENCOUNTER — Other Ambulatory Visit: Payer: Self-pay | Admitting: Family Medicine

## 2022-07-23 DIAGNOSIS — M1A39X0 Chronic gout due to renal impairment, multiple sites, without tophus (tophi): Secondary | ICD-10-CM

## 2022-07-23 DIAGNOSIS — I129 Hypertensive chronic kidney disease with stage 1 through stage 4 chronic kidney disease, or unspecified chronic kidney disease: Secondary | ICD-10-CM

## 2022-07-26 ENCOUNTER — Other Ambulatory Visit: Payer: Self-pay | Admitting: Family Medicine

## 2022-07-26 DIAGNOSIS — N183 Chronic kidney disease, stage 3 unspecified: Secondary | ICD-10-CM

## 2022-08-12 ENCOUNTER — Encounter: Payer: Self-pay | Admitting: Family Medicine

## 2022-08-12 ENCOUNTER — Ambulatory Visit (INDEPENDENT_AMBULATORY_CARE_PROVIDER_SITE_OTHER): Payer: Medicare Other | Admitting: Family Medicine

## 2022-08-12 VITALS — BP 116/67 | HR 68 | Temp 97.7°F | Ht 63.0 in | Wt 200.4 lb

## 2022-08-12 DIAGNOSIS — Z01818 Encounter for other preprocedural examination: Secondary | ICD-10-CM | POA: Diagnosis not present

## 2022-08-12 DIAGNOSIS — Z905 Acquired absence of kidney: Secondary | ICD-10-CM | POA: Diagnosis not present

## 2022-08-12 DIAGNOSIS — N183 Chronic kidney disease, stage 3 unspecified: Secondary | ICD-10-CM | POA: Diagnosis not present

## 2022-08-12 DIAGNOSIS — E1122 Type 2 diabetes mellitus with diabetic chronic kidney disease: Secondary | ICD-10-CM | POA: Diagnosis not present

## 2022-08-12 DIAGNOSIS — I129 Hypertensive chronic kidney disease with stage 1 through stage 4 chronic kidney disease, or unspecified chronic kidney disease: Secondary | ICD-10-CM | POA: Diagnosis not present

## 2022-08-12 DIAGNOSIS — E785 Hyperlipidemia, unspecified: Secondary | ICD-10-CM | POA: Diagnosis not present

## 2022-08-12 DIAGNOSIS — N1831 Chronic kidney disease, stage 3a: Secondary | ICD-10-CM

## 2022-08-12 DIAGNOSIS — E1169 Type 2 diabetes mellitus with other specified complication: Secondary | ICD-10-CM | POA: Diagnosis not present

## 2022-08-12 DIAGNOSIS — E039 Hypothyroidism, unspecified: Secondary | ICD-10-CM | POA: Diagnosis not present

## 2022-08-12 DIAGNOSIS — E119 Type 2 diabetes mellitus without complications: Secondary | ICD-10-CM

## 2022-08-12 LAB — BAYER DCA HB A1C WAIVED: HB A1C (BAYER DCA - WAIVED): 7.9 % — ABNORMAL HIGH (ref 4.8–5.6)

## 2022-08-12 NOTE — Progress Notes (Signed)
Pt is a 71 y.o. female who is here for preoperative clearance for right knee replacement with Dr Almyra Deforest.  She has a PMH significant for diet controlled DM, HTN, HLD, CKD due to right nephrectomy for RCC and hypothyroidism.  She denies any chest pain, shortness of breath, change in urine output.  No rectal or vaginal bleeding.  She had no complications with her surgery on the left side earlier this year.  She admits that she is not as strict with carbohydrates so is a little worried about her sugar but hopefully will be okay  1) High Risk Cardiac Conditions  1) Recent MI - No.  2) Decompensated Heart Failure - No.  3) Unstable angina - No.  4) Symptomatic arrythmia - No.  5) Sx Valvular Disease - No.  2) Intermediate Risk Factors - DM, CKD, CVA, CHF, CAD - Yes.    2) Functional Status - > 4 mets (Walk, run, climb stairs) Yes.  Marland Kitchen   3) Surgery Specific Risk - Intermediate (  Orthopaedic )   4) Further Noninvasive evaluation -   1) EKG - Yes.     1) Hx of CVA, CAD, DM, CKD  2) Echo - No.   1) Worsening dyspnea   3) Stress Testing - Active Cardiac Disease - No.  5) Need for medical therapy - Beta Blocker, Statins indicated ? No.  PE: Vitals:   08/12/22 1431  BP: 116/67  Pulse: 68  Temp: 97.7 F (36.5 C)  SpO2: 96%   Physical Examination: General appearance - alert, well appearing, and in no distress Mental status - alert, oriented to person, place, and time Eyes - pupils equal and reactive, extraocular eye movements intact Mouth - mucous membranes moist, pharynx normal without lesions; Mallampati 1 Neck - supple, no significant adenopathy Chest - clear to auscultation, no wheezes, rales or rhonchi, symmetric air entry Heart - normal rate, regular rhythm, normal S1, S2, no murmurs, rubs, clicks or gallops Musculoskeletal -antalgic gait.  Unable to extend right knee fully.   Pre-op exam - Plan: Protime-INR, CMP14+EGFR, CBC with Differential/Platelet, Bayer DCA Hb A1c Waived, EKG  12-Lead, Microalbumin / creatinine urine ratio  Diet-controlled diabetes mellitus ([place]) - Plan: CMP14+EGFR, Bayer DCA Hb A1c Waived  History of nephrectomy  Stage 3a chronic kidney disease (HCC) - Plan: CMP14+EGFR, CBC with Differential/Platelet, EKG 12-Lead, Microalbumin / creatinine urine ratio, VITAMIN D 25 Hydroxy (Vit-D Deficiency, Fractures)  Hypertension associated with stage 3 chronic kidney disease due to type 2 diabetes mellitus (HCC) - Plan: CMP14+EGFR, EKG 12-Lead  Hyperlipidemia associated with type 2 diabetes mellitus ([place]) - Plan: CMP14+EGFR  Acquired hypothyroidism - Plan: TSH, T4, Free  Labs collected.  Will forward to Dr Aluscio's office once available with her clearance form.  EKG unchanged from previous.  No apparent contraindications to proceeding with surgery  I have independently evaluated patient.  MARSHELLA TELLO is a 71 y.o. female who is low risk for an intermediate risk surgery.  There are not modifiable risk factors (smoking, etc). Emerly Prak Depaolo's RCRI calculation for MACE is: 0.    Neely Cecena M. Lajuana Ripple, [facility]

## 2022-08-13 LAB — CBC WITH DIFFERENTIAL/PLATELET
Basophils Absolute: 0.1 10*3/uL (ref 0.0–0.2)
Basos: 1 %
EOS (ABSOLUTE): 0.2 10*3/uL (ref 0.0–0.4)
Eos: 3 %
Hematocrit: 39.7 % (ref 34.0–46.6)
Hemoglobin: 13.4 g/dL (ref 11.1–15.9)
Immature Grans (Abs): 0 10*3/uL (ref 0.0–0.1)
Immature Granulocytes: 0 %
Lymphocytes Absolute: 2 10*3/uL (ref 0.7–3.1)
Lymphs: 29 %
MCH: 30.9 pg (ref 26.6–33.0)
MCHC: 33.8 g/dL (ref 31.5–35.7)
MCV: 92 fL (ref 79–97)
Monocytes Absolute: 0.5 10*3/uL (ref 0.1–0.9)
Monocytes: 7 %
Neutrophils Absolute: 4.2 10*3/uL (ref 1.4–7.0)
Neutrophils: 60 %
Platelets: 244 10*3/uL (ref 150–450)
RBC: 4.33 x10E6/uL (ref 3.77–5.28)
RDW: 14.9 % (ref 11.7–15.4)
WBC: 6.9 10*3/uL (ref 3.4–10.8)

## 2022-08-13 LAB — CMP14+EGFR
ALT: 44 IU/L — ABNORMAL HIGH (ref 0–32)
AST: 39 IU/L (ref 0–40)
Albumin/Globulin Ratio: 1.5 (ref 1.2–2.2)
Albumin: 4.6 g/dL (ref 3.8–4.8)
Alkaline Phosphatase: 110 IU/L (ref 44–121)
BUN/Creatinine Ratio: 15 (ref 12–28)
BUN: 16 mg/dL (ref 8–27)
Bilirubin Total: 0.6 mg/dL (ref 0.0–1.2)
CO2: 22 mmol/L (ref 20–29)
Calcium: 10.2 mg/dL (ref 8.7–10.3)
Chloride: 100 mmol/L (ref 96–106)
Creatinine, Ser: 1.06 mg/dL — ABNORMAL HIGH (ref 0.57–1.00)
Globulin, Total: 3 g/dL (ref 1.5–4.5)
Glucose: 118 mg/dL — ABNORMAL HIGH (ref 70–99)
Potassium: 4.1 mmol/L (ref 3.5–5.2)
Sodium: 140 mmol/L (ref 134–144)
Total Protein: 7.6 g/dL (ref 6.0–8.5)
eGFR: 56 mL/min/{1.73_m2} — ABNORMAL LOW (ref >59)

## 2022-08-13 LAB — PROTIME-INR
INR: 1 (ref 0.9–1.2)
Prothrombin Time: 11.2 s (ref 9.1–12.0)

## 2022-08-13 LAB — TSH: TSH: 3.58 u[IU]/mL (ref 0.450–4.500)

## 2022-08-13 LAB — T4, FREE: Free T4: 1.06 ng/dL (ref 0.82–1.77)

## 2022-08-13 LAB — VITAMIN D 25 HYDROXY (VIT D DEFICIENCY, FRACTURES): Vit D, 25-Hydroxy: 32.2 ng/mL (ref 30.0–100.0)

## 2022-08-14 LAB — MICROALBUMIN / CREATININE URINE RATIO
Creatinine, Urine: 50.7 mg/dL
Microalb/Creat Ratio: <6 mg/g creat (ref 0–29)
Microalbumin, Urine: <3 ug/mL

## 2022-08-26 DIAGNOSIS — M1712 Unilateral primary osteoarthritis, left knee: Secondary | ICD-10-CM | POA: Diagnosis not present

## 2022-08-26 DIAGNOSIS — G8918 Other acute postprocedural pain: Secondary | ICD-10-CM | POA: Diagnosis not present

## 2022-08-28 ENCOUNTER — Ambulatory Visit (INDEPENDENT_AMBULATORY_CARE_PROVIDER_SITE_OTHER): Payer: Medicare Other

## 2022-08-28 VITALS — Ht 63.0 in | Wt 200.0 lb

## 2022-08-28 DIAGNOSIS — Z1231 Encounter for screening mammogram for malignant neoplasm of breast: Secondary | ICD-10-CM

## 2022-08-28 DIAGNOSIS — M6281 Muscle weakness (generalized): Secondary | ICD-10-CM | POA: Diagnosis not present

## 2022-08-28 DIAGNOSIS — Z780 Asymptomatic menopausal state: Secondary | ICD-10-CM

## 2022-08-28 DIAGNOSIS — M25562 Pain in left knee: Secondary | ICD-10-CM | POA: Diagnosis not present

## 2022-08-28 DIAGNOSIS — Z0000 Encounter for general adult medical examination without abnormal findings: Secondary | ICD-10-CM

## 2022-08-28 DIAGNOSIS — M25662 Stiffness of left knee, not elsewhere classified: Secondary | ICD-10-CM | POA: Diagnosis not present

## 2022-08-28 NOTE — Patient Instructions (Signed)
Kristie Lewis , Thank you for taking time to come for your Medicare Wellness Visit. I appreciate your ongoing commitment to your health goals. Please review the following plan we discussed and let me know if I can assist you in the future.   These are the goals we discussed:  Goals      Exercise 3x per week (30 min per time)     Increase exercise after knee surgery.        This is a list of the screening recommended for you and due dates:  Health Maintenance  Topic Date Due   Eye exam for diabetics  Never done   DTaP/Tdap/Td vaccine (3 - Tdap) 04/10/2009   Complete foot exam   06/16/2022   COVID-19 Vaccine (1) 08/28/2022*   Zoster (Shingles) Vaccine (1 of 2) 11/12/2022*   Flu Shot  12/13/2022*   Pneumonia Vaccine (1 - PCV) 08/13/2023*   Mammogram  08/13/2023*   Hemoglobin A1C  02/10/2023   Yearly kidney function blood test for diabetes  08/13/2023   Yearly kidney health urinalysis for diabetes  08/13/2023   Medicare Annual Wellness Visit  08/29/2023   Colon Cancer Screening  06/05/2026   DEXA scan (bone density measurement)  Completed   Hepatitis C Screening: USPSTF Recommendation to screen - Ages 69-79 yo.  Completed   HPV Vaccine  Aged Out  *Topic was postponed. The date shown is not the original due date.    Advanced directives: Please bring a copy of your health care power of attorney and living will to the office to be added to your chart at your convenience.   Conditions/risks identified: Aim for 30 minutes of exercise or brisk walking, 6-8 glasses of water, and 5 servings of fruits and vegetables each day.   Next appointment: Follow up in one year for your annual wellness visit    Preventive Care 65 Years and Older, Female Preventive care refers to lifestyle choices and visits with your health care provider that can promote health and wellness. What does preventive care include? A yearly physical exam. This is also called an annual well check. Dental exams once or  twice a year. Routine eye exams. Ask your health care provider how often you should have your eyes checked. Personal lifestyle choices, including: Daily care of your teeth and gums. Regular physical activity. Eating a healthy diet. Avoiding tobacco and drug use. Limiting alcohol use. Practicing safe sex. Taking low-dose aspirin every day. Taking vitamin and mineral supplements as recommended by your health care provider. What happens during an annual well check? The services and screenings done by your health care provider during your annual well check will depend on your age, overall health, lifestyle risk factors, and family history of disease. Counseling  Your health care provider may ask you questions about your: Alcohol use. Tobacco use. Drug use. Emotional well-being. Home and relationship well-being. Sexual activity. Eating habits. History of falls. Memory and ability to understand (cognition). Work and work Statistician. Reproductive health. Screening  You may have the following tests or measurements: Height, weight, and BMI. Blood pressure. Lipid and cholesterol levels. These may be checked every 5 years, or more frequently if you are over 43 years old. Skin check. Lung cancer screening. You may have this screening every year starting at age 49 if you have a 30-pack-year history of smoking and currently smoke or have quit within the past 15 years. Fecal occult blood test (FOBT) of the stool. You may have this test every  year starting at age 34. Flexible sigmoidoscopy or colonoscopy. You may have a sigmoidoscopy every 5 years or a colonoscopy every 10 years starting at age 2. Hepatitis C blood test. Hepatitis B blood test. Sexually transmitted disease (STD) testing. Diabetes screening. This is done by checking your blood sugar (glucose) after you have not eaten for a while (fasting). You may have this done every 1-3 years. Bone density scan. This is done to screen for  osteoporosis. You may have this done starting at age 82. Mammogram. This may be done every 1-2 years. Talk to your health care provider about how often you should have regular mammograms. Talk with your health care provider about your test results, treatment options, and if necessary, the need for more tests. Vaccines  Your health care provider may recommend certain vaccines, such as: Influenza vaccine. This is recommended every year. Tetanus, diphtheria, and acellular pertussis (Tdap, Td) vaccine. You may need a Td booster every 10 years. Zoster vaccine. You may need this after age 30. Pneumococcal 13-valent conjugate (PCV13) vaccine. One dose is recommended after age 65. Pneumococcal polysaccharide (PPSV23) vaccine. One dose is recommended after age 22. Talk to your health care provider about which screenings and vaccines you need and how often you need them. This information is not intended to replace advice given to you by your health care provider. Make sure you discuss any questions you have with your health care provider. Document Released: 09/27/2015 Document Revised: 05/20/2016 Document Reviewed: 07/02/2015 Elsevier Interactive Patient Education  2017 [place] Prevention in the Home Falls can cause injuries. They can happen to people of all ages. There are many things you can do to make your home safe and to help prevent falls. What can I do on the outside of my home? Regularly fix the edges of walkways and driveways and fix any cracks. Remove anything that might make you trip as you walk through a door, such as a raised step or threshold. Trim any bushes or trees on the path to your home. Use bright outdoor lighting. Clear any walking paths of anything that might make someone trip, such as rocks or tools. Regularly check to see if handrails are loose or broken. Make sure that both sides of any steps have handrails. Any raised decks and porches should have guardrails on  the edges. Have any leaves, snow, or ice cleared regularly. Use sand or salt on walking paths during winter. Clean up any spills in your garage right away. This includes oil or grease spills. What can I do in the bathroom? Use night lights. Install grab bars by the toilet and in the tub and shower. Do not use towel bars as grab bars. Use non-skid mats or decals in the tub or shower. If you need to sit down in the shower, use a plastic, non-slip stool. Keep the floor dry. Clean up any water that spills on the floor as soon as it happens. Remove soap buildup in the tub or shower regularly. Attach bath mats securely with double-sided non-slip rug tape. Do not have throw rugs and other things on the floor that can make you trip. What can I do in the bedroom? Use night lights. Make sure that you have a light by your bed that is easy to reach. Do not use any sheets or blankets that are too big for your bed. They should not hang down onto the floor. Have a firm chair that has side arms. You can use this  for support while you get dressed. Do not have throw rugs and other things on the floor that can make you trip. What can I do in the kitchen? Clean up any spills right away. Avoid walking on wet floors. Keep items that you use a lot in easy-to-reach places. If you need to reach something above you, use a strong step stool that has a grab bar. Keep electrical cords out of the way. Do not use floor polish or wax that makes floors slippery. If you must use wax, use non-skid floor wax. Do not have throw rugs and other things on the floor that can make you trip. What can I do with my stairs? Do not leave any items on the stairs. Make sure that there are handrails on both sides of the stairs and use them. Fix handrails that are broken or loose. Make sure that handrails are as long as the stairways. Check any carpeting to make sure that it is firmly attached to the stairs. Fix any carpet that is loose  or worn. Avoid having throw rugs at the top or bottom of the stairs. If you do have throw rugs, attach them to the floor with carpet tape. Make sure that you have a light switch at the top of the stairs and the bottom of the stairs. If you do not have them, ask someone to add them for you. What else can I do to help prevent falls? Wear shoes that: Do not have high heels. Have rubber bottoms. Are comfortable and fit you well. Are closed at the toe. Do not wear sandals. If you use a stepladder: Make sure that it is fully opened. Do not climb a closed stepladder. Make sure that both sides of the stepladder are locked into place. Ask someone to hold it for you, if possible. Clearly mark and make sure that you can see: Any grab bars or handrails. First and last steps. Where the edge of each step is. Use tools that help you move around (mobility aids) if they are needed. These include: Canes. Walkers. Scooters. Crutches. Turn on the lights when you go into a dark area. Replace any light bulbs as soon as they burn out. Set up your furniture so you have a clear path. Avoid moving your furniture around. If any of your floors are uneven, fix them. If there are any pets around you, be aware of where they are. Review your medicines with your doctor. Some medicines can make you feel dizzy. This can increase your chance of falling. Ask your doctor what other things that you can do to help prevent falls. This information is not intended to replace advice given to you by your health care provider. Make sure you discuss any questions you have with your health care provider. Document Released: 06/27/2009 Document Revised: 02/06/2016 Document Reviewed: 10/05/2014 Elsevier Interactive Patient Education  2017 [employer].

## 2022-08-28 NOTE — Progress Notes (Signed)
Subjective:   Kristie Lewis is a 71 y.o. female who presents for Medicare Annual (Subsequent) preventive examination. I connected with  Duaine Dredge on 08/28/22 by a audio enabled telemedicine application and verified that I am speaking with the correct person using two identifiers.  Patient Location: Home  Provider Location: Home Office  I discussed the limitations of evaluation and management by telemedicine. The patient expressed understanding and agreed to proceed.  Review of Systems     Cardiac Risk Factors include: advanced age (>28mn, >>22women)     Objective:    Today's Vitals   08/28/22 1201  Weight: 200 lb (90.7 kg)  Height: _0  (1.6 m)   Body mass index is 35.43 kg/m.     08/28/2022   12:05 PM 07/23/2021    3:00 PM 03/16/2017   10:05 AM 06/05/2016    9:27 AM  Advanced Directives  Does Patient Have a Medical Advance Directive? Yes Yes Yes Yes  Type of AParamedicof AGu [place] will [place] will    Copy of [place]? No - copy requested No - copy requested      Current Medications (verified) Outpatient Encounter Medications as of 08/28/2022  Medication Sig   allopurinol (ZYLOPRIM) 100 MG tablet TAKE ONE (1) TABLET BY MOUTH EVERY DAY   aspirin EC 81 MG tablet Take 81 mg by mouth 2 (two) times daily.   atenolol (TENORMIN) 25 MG tablet Take 1 tablet (25 mg total) by mouth daily. (NEEDS TO BE SEEN BEFORE NEXT REFILL)   B Complex Vitamins (B-COMPLEX/B-12 PO) Take by mouth.   colchicine 0.6 MG tablet TAKE 2 TABLETS AT THE ONSET OF ATTACK THEN 1 TAB EVERY 2 HOURS AS NEEDED UP TO 6 TABLETS THEN TAKE TWICE DAILY UNTIL SYMPTOMS CLEAR   furosemide (LASIX) 20 MG tablet Take 1 tablet (20 mg total) by mouth daily. (NEEDS TO BE SEEN BEFORE NEXT REFILL)   gabapentin (NEURONTIN) 300 MG capsule Take 1 capsule (300 mg total) by mouth at bedtime.   KRILL OIL 1000 MG CAPS Take by mouth.    L-Lysine 1000 MG TABS Take by mouth.   Misc Natural Products (OSTEO BI-FLEX ADV DOUBLE ST PO) Take 2 capsules by mouth daily.    potassium chloride SA (KLOR-CON M) 20 MEQ tablet TAKE ONE (1) TABLET EACH DAY   UNABLE TO FIND systane eye drops   vitamin C (ASCORBIC ACID) 500 MG tablet Take 500 mg by mouth daily.   VITAMIN D PO Take by mouth.   zinc gluconate 50 MG tablet Take 50 mg by mouth daily.   No facility-administered encounter medications on file as of 08/28/2022.    Allergies (verified) Patient has no known allergies.   History: Past Medical History:  Diagnosis Date   Arthritis    Cataract    GERD (gastroesophageal reflux disease)    Gout    Hyperlipidemia    Hypertension    Microscopic colitis    [place]   Past Surgical History:  Procedure Laterality Date   BREAST SURGERY     right breast bx   CESAREAN SECTION     3 c-sections   COLONOSCOPY     HERNIA REPAIR     umbilical    NEPHRECTOMY Right    RIGHT KNEE ARTHROSCOPY     TUBAL LIGATION     UPPER GASTROINTESTINAL ENDOSCOPY     Family History  Problem Relation Age  of Onset   Hyperlipidemia Mother    Stroke Mother    Diabetes Father    Heart disease Father    Hypertension Father    Stroke Father    Colon cancer Neg Hx    Esophageal cancer Neg Hx    Stomach cancer Neg Hx    Rectal cancer Neg Hx    Social History   Socioeconomic History   Marital status: Married    Spouse name: Guerrant   Number of children: 2   Years of education: Not on file   Highest education level: Not on file  Occupational History   Occupation: Retired    Comment: Therapist, sports  Tobacco Use   Smoking status: Never   Smokeless tobacco: Never  Vaping Use   Vaping Use: Never used  Substance and Sexual Activity   Alcohol use: No   Drug use: No   Sexual activity: Not on file  Other Topics Concern   Not on file  Social History Narrative   Married x 25 years.    2 children   3 step-children   9 grandchildren    Social Determinants of Health   Financial Resource Strain: Low Risk  (08/28/2022)   Overall Financial Resource Strain (CARDIA)    Difficulty of Paying Living Expenses: Not hard at all  Food Insecurity: No Food Insecurity (08/28/2022)   Hunger Vital Sign    Worried About Running Out of Food in the Last Year: Never true    Ran Out of Food in the Last Year: Never true  Transportation Needs: No Transportation Needs (08/28/2022)   PRAPARE - Hydrologist (Medical): No    Lack of Transportation (Non-Medical): No  Physical Activity: Insufficiently Active (08/28/2022)   Exercise Vital Sign    Days of Exercise per Week: 3 days    Minutes of Exercise per Session: 30 min  Stress: No Stress Concern Present (08/28/2022)   [place]    Feeling of Stress : Not at all  Social Connections: Moderately Isolated (08/28/2022)   Social Connection and Isolation Panel [NHANES]    Frequency of Communication with Friends and Family: More than three times a week    Frequency of Social Gatherings with Friends and Family: More than three times a week    Attends Religious Services: Never    Marine scientist or Organizations: No    Attends Music therapist: Never    Marital Status: Married    Tobacco Counseling Counseling given: Not Answered   Clinical Intake:  Pre-visit preparation completed: Yes  Pain : No/denies pain     Nutritional Risks: None Diabetes: No  How often do you need to have someone help you when you read instructions, pamphlets, or other written materials from your doctor or pharmacy?: 1 - Never  Diabetic?no   Interpreter Needed?: No  Information entered by :: Jadene Pierini, LPN   Activities of Daily Living    08/28/2022   12:05 PM  In your present state of health, do you have any difficulty performing the following activities:  Hearing? 0  Vision? 0   Difficulty concentrating or making decisions? 0  Walking or climbing stairs? 0  Dressing or bathing? 0  Doing errands, shopping? 0  Preparing Food and eating ? N  Using the Toilet? N  In the past six months, have you accidently leaked urine? N  Do you have problems with loss of bowel control?  N  Managing your Medications? N  Managing your Finances? N  Housekeeping or managing your Housekeeping? N    Patient Care Team: Janora Norlander, DO as PCP - General (Family Medicine) Satira Sark, MD as PCP - Cardiology (Cardiology)  Indicate any recent Medical Services you may have received from other than [facility] providers in the past year (date may be approximate).     Assessment:   This is a routine wellness examination for Shara.  Hearing/Vision screen Vision Screening - Comments:: Wears rx glasses - up to date with routine eye exams with  Dr. Mikey Bussing  Dietary issues and exercise activities discussed: Current Exercise Habits: Home exercise routine, Type of exercise: walking, Time (Minutes): 30, Frequency (Times/Week): 3, Weekly Exercise (Minutes/Week): 90, Intensity: Mild, Exercise limited by: orthopedic condition(s)   Goals Addressed             This Visit's Progress    Exercise 3x per week (30 min per time)   On track    Increase exercise after knee surgery.       Depression Screen    08/28/2022   12:04 PM 09/16/2021    1:54 PM 07/23/2021    2:56 PM 10/29/2020    9:14 AM 07/05/2020    2:33 PM 04/05/2020    4:05 PM 08/25/2019    8:16 AM  PHQ 2/9 Scores  PHQ - 2 Score 0 0 0 0 0 0 0  PHQ- 9 Score     0  0    Fall Risk    08/28/2022   12:02 PM 09/16/2021    1:54 PM 07/23/2021    3:00 PM 07/21/2021    9:56 PM 10/29/2020    9:14 AM  [place] in the past year? 0 0 0 0 0  Number falls in past yr: 0  0  0  Injury with Fall? 0  0  0  Risk for fall due to : No Fall Risks  Impaired mobility    Follow up Falls prevention discussed  Falls prevention discussed       FALL RISK PREVENTION PERTAINING TO THE HOME:  Any stairs in or around the home? Yes  If so, are there any without handrails? No  Home free of loose throw rugs in walkways, pet beds, electrical cords, etc? Yes  Adequate lighting in your home to reduce risk of falls? Yes   ASSISTIVE DEVICES UTILIZED TO PREVENT FALLS:  Life alert? No  Use of a cane, walker or w/c? Yes  Grab bars in the bathroom? No  Shower chair or bench in shower? Yes  Elevated toilet seat or a handicapped toilet? Yes        08/28/2022   12:06 PM 07/23/2021    3:16 PM 07/23/2021    3:03 PM  6CIT Screen  What Year? 0 points 0 points 0 points  What month? 0 points 0 points 0 points  What time? 0 points 0 points 0 points  Count back from 20 0 points 0 points 0 points  Months in reverse 0 points 0 points 0 points  Repeat phrase 0 points 2 points 2 points  Total Score 0 points 2 points 2 points    Immunizations Immunization History  Administered Date(s) Administered   Td 04/11/1999   Td (Adult),5 Lf Tetanus Toxid, Preservative Free 04/11/1999    TDAP status: Due, Education has been provided regarding the importance of this vaccine. Advised may receive this vaccine at local pharmacy or  Health Dept. Aware to provide a copy of the vaccination record if obtained from local pharmacy or Health Dept. Verbalized acceptance and understanding.  Flu Vaccine status: Due, Education has been provided regarding the importance of this vaccine. Advised may receive this vaccine at local pharmacy or Health Dept. Aware to provide a copy of the vaccination record if obtained from local pharmacy or Health Dept. Verbalized acceptance and understanding.  Pneumococcal vaccine status: Due, Education has been provided regarding the importance of this vaccine. Advised may receive this vaccine at local pharmacy or Health Dept. Aware to provide a copy of the vaccination record if obtained from local pharmacy or Health Dept. Verbalized  acceptance and understanding.  Covid-19 vaccine status: Completed vaccines  Qualifies for Shingles Vaccine? Yes   Zostavax completed No   Shingrix Completed?: No.    Education has been provided regarding the importance of this vaccine. Patient has been advised to call insurance company to determine out of pocket expense if they have not yet received this vaccine. Advised may also receive vaccine at local pharmacy or Health Dept. Verbalized acceptance and understanding.  Screening Tests Health Maintenance  Topic Date Due   OPHTHALMOLOGY EXAM  Never done   DTaP/Tdap/Td (3 - Tdap) 04/10/2009   FOOT EXAM  06/16/2022   COVID-19 Vaccine (1) 08/28/2022 (Originally 06/24/1951)   Zoster Vaccines- Shingrix (1 of 2) 11/12/2022 (Originally 12/22/2000)   INFLUENZA VACCINE  12/13/2022 (Originally 04/14/2022)   Pneumonia Vaccine 69+ Years old (1 - PCV) 08/13/2023 (Originally 12/23/2015)   MAMMOGRAM  08/13/2023 (Originally 06/24/2020)   HEMOGLOBIN A1C  02/10/2023   Diabetic kidney evaluation - eGFR measurement  08/13/2023   Diabetic kidney evaluation - Urine ACR  08/13/2023   Medicare Annual Wellness (AWV)  08/29/2023   COLONOSCOPY (Pts 45-31yr Insurance coverage will need to be confirmed)  06/05/2026   DEXA SCAN  Completed   Hepatitis C Screening  Completed   HPV VACCINES  Aged Out    Health Maintenance  Health Maintenance Due  Topic Date Due   OPHTHALMOLOGY EXAM  Never done   DTaP/Tdap/Td (3 - Tdap) 04/10/2009   FOOT EXAM  06/16/2022    Colorectal cancer screening: Type of screening: Colonoscopy. Completed 06/05/2016. Repeat every 10 years  Mammogram status: Ordered 08/28/2022. Pt provided with contact info and advised to call to schedule appt.   Bone Density status: Ordered 08/28/2022. Pt provided with contact info and advised to call to schedule appt.  Lung Cancer Screening: (Low Dose CT Chest recommended if Age 71-80years, 30 pack-year currently smoking OR have quit w/in 15years.) does  not qualify.   Lung Cancer Screening Referral: n/a  Additional Screening:  Hepatitis C Screening: does not qualify; Completed 08/25/2019  Vision Screening: Recommended annual ophthalmology exams for early detection of glaucoma and other disorders of the eye. Is the patient up to date with their annual eye exam?  Yes  Who is the provider or what is the name of the office in which the patient attends annual eye exams? Dr. SMikey Bussing If pt is not established with a provider, would they like to be referred to a provider to establish care? No .   Dental Screening: Recommended annual dental exams for proper oral hygiene  Community Resource Referral / Chronic Care Management: CRR required this visit?  No   CCM required this visit?  No      Plan:     I have personally reviewed and noted the following in the patient's chart:   Medical  and social history Use of alcohol, tobacco or illicit drugs  Current medications and supplements including opioid prescriptions. Patient is not currently taking opioid prescriptions. Functional ability and status Nutritional status Physical activity Advanced directives List of other physicians Hospitalizations, surgeries, and ER visits in previous 12 months Vitals Screenings to include cognitive, depression, and falls Referrals and appointments  In addition, I have reviewed and discussed with patient certain preventive protocols, quality metrics, and best practice recommendations. A written personalized care plan for preventive services as well as general preventive health recommendations were provided to patient.     Daphane Shepherd, LPN   [id number]   Nurse Notes: Declines all vaccines

## 2022-08-31 DIAGNOSIS — M25562 Pain in left knee: Secondary | ICD-10-CM | POA: Diagnosis not present

## 2022-08-31 DIAGNOSIS — M6281 Muscle weakness (generalized): Secondary | ICD-10-CM | POA: Diagnosis not present

## 2022-08-31 DIAGNOSIS — M25662 Stiffness of left knee, not elsewhere classified: Secondary | ICD-10-CM | POA: Diagnosis not present

## 2022-09-02 DIAGNOSIS — M25561 Pain in right knee: Secondary | ICD-10-CM | POA: Diagnosis not present

## 2022-09-02 DIAGNOSIS — M25661 Stiffness of right knee, not elsewhere classified: Secondary | ICD-10-CM | POA: Diagnosis not present

## 2022-09-03 DIAGNOSIS — M25661 Stiffness of right knee, not elsewhere classified: Secondary | ICD-10-CM | POA: Diagnosis not present

## 2022-09-03 DIAGNOSIS — M25561 Pain in right knee: Secondary | ICD-10-CM | POA: Diagnosis not present

## 2022-09-08 DIAGNOSIS — M25561 Pain in right knee: Secondary | ICD-10-CM | POA: Diagnosis not present

## 2022-09-08 DIAGNOSIS — M25661 Stiffness of right knee, not elsewhere classified: Secondary | ICD-10-CM | POA: Diagnosis not present

## 2022-09-09 DIAGNOSIS — M25561 Pain in right knee: Secondary | ICD-10-CM | POA: Diagnosis not present

## 2022-09-09 DIAGNOSIS — M25661 Stiffness of right knee, not elsewhere classified: Secondary | ICD-10-CM | POA: Diagnosis not present

## 2022-09-12 ENCOUNTER — Other Ambulatory Visit: Payer: Self-pay | Admitting: Family Medicine

## 2022-09-12 DIAGNOSIS — E1122 Type 2 diabetes mellitus with diabetic chronic kidney disease: Secondary | ICD-10-CM

## 2022-09-15 DIAGNOSIS — M25561 Pain in right knee: Secondary | ICD-10-CM | POA: Diagnosis not present

## 2022-09-15 DIAGNOSIS — M25661 Stiffness of right knee, not elsewhere classified: Secondary | ICD-10-CM | POA: Diagnosis not present

## 2022-09-17 DIAGNOSIS — M25661 Stiffness of right knee, not elsewhere classified: Secondary | ICD-10-CM | POA: Diagnosis not present

## 2022-09-17 DIAGNOSIS — M25561 Pain in right knee: Secondary | ICD-10-CM | POA: Diagnosis not present

## 2022-09-22 DIAGNOSIS — M25561 Pain in right knee: Secondary | ICD-10-CM | POA: Diagnosis not present

## 2022-09-22 DIAGNOSIS — M25661 Stiffness of right knee, not elsewhere classified: Secondary | ICD-10-CM | POA: Diagnosis not present

## 2022-09-24 DIAGNOSIS — M25561 Pain in right knee: Secondary | ICD-10-CM | POA: Diagnosis not present

## 2022-09-24 DIAGNOSIS — M25661 Stiffness of right knee, not elsewhere classified: Secondary | ICD-10-CM | POA: Diagnosis not present

## 2022-09-29 DIAGNOSIS — M25561 Pain in right knee: Secondary | ICD-10-CM | POA: Diagnosis not present

## 2022-09-29 DIAGNOSIS — M25661 Stiffness of right knee, not elsewhere classified: Secondary | ICD-10-CM | POA: Diagnosis not present

## 2022-10-01 DIAGNOSIS — Z96652 Presence of left artificial knee joint: Secondary | ICD-10-CM | POA: Diagnosis not present

## 2022-10-01 DIAGNOSIS — Z96651 Presence of right artificial knee joint: Secondary | ICD-10-CM | POA: Diagnosis not present

## 2022-10-01 DIAGNOSIS — M25561 Pain in right knee: Secondary | ICD-10-CM | POA: Diagnosis not present

## 2022-10-01 DIAGNOSIS — M25661 Stiffness of right knee, not elsewhere classified: Secondary | ICD-10-CM | POA: Diagnosis not present

## 2022-10-05 ENCOUNTER — Telehealth: Payer: Self-pay | Admitting: Family Medicine

## 2022-10-05 DIAGNOSIS — I129 Hypertensive chronic kidney disease with stage 1 through stage 4 chronic kidney disease, or unspecified chronic kidney disease: Secondary | ICD-10-CM

## 2022-10-05 DIAGNOSIS — M1A39X0 Chronic gout due to renal impairment, multiple sites, without tophus (tophi): Secondary | ICD-10-CM

## 2022-10-05 MED ORDER — ALLOPURINOL 100 MG PO TABS: 100.0000 mg | ORAL_TABLET | Freq: Every day | ORAL | 0 refills | Status: DC

## 2022-10-05 MED ORDER — FUROSEMIDE 20 MG PO TABS: 20.0000 mg | ORAL_TABLET | Freq: Every day | ORAL | 0 refills | Status: DC

## 2022-10-05 MED ORDER — POTASSIUM CHLORIDE CRYS ER 20 MEQ PO TBCR: EXTENDED_RELEASE_TABLET | ORAL | 0 refills | Status: DC

## 2022-10-05 MED ORDER — ATENOLOL 25 MG PO TABS: 25.0000 mg | ORAL_TABLET | Freq: Every day | ORAL | 0 refills | Status: DC

## 2022-10-05 NOTE — Telephone Encounter (Signed)
[facility], also reminder to call office to make 6 mos appt in May

## 2022-10-05 NOTE — Telephone Encounter (Signed)
  Prescription Request  10/05/2022  Is this a "Controlled Substance" medicine?  furosemide (LASIX) 20 MG tablet  potassium chloride SA (KLOR-CON M) 20 MEQ tablet  allopurinol (ZYLOPRIM) 100 MG tablet  atenolol (TENORMIN) 25 MG tablet  Pt is asking for a 90 day supply  Have you seen your PCP in the last 2 weeks? November of 2023  If YES, route message to pool  -  If NO, patient needs to be scheduled for appointment.  What is the name of the medication or equipment? furosemide (LASIX) 20 MG tablet  potassium chloride SA (KLOR-CON M) 20 MEQ tablet  allopurinol (ZYLOPRIM) 100 MG tablet  atenolol (TENORMIN) 25 MG tablet   Have you contacted your pharmacy to request a refill? no   Which pharmacy would you like [facility]   Patient notified that their request is being sent to the clinical staff for review and that they should receive a response within 2 business days.

## 2022-10-06 DIAGNOSIS — M25661 Stiffness of right knee, not elsewhere classified: Secondary | ICD-10-CM | POA: Diagnosis not present

## 2022-10-06 DIAGNOSIS — M25561 Pain in right knee: Secondary | ICD-10-CM | POA: Diagnosis not present

## 2022-10-08 DIAGNOSIS — M25562 Pain in left knee: Secondary | ICD-10-CM | POA: Diagnosis not present

## 2022-10-08 DIAGNOSIS — M25662 Stiffness of left knee, not elsewhere classified: Secondary | ICD-10-CM | POA: Diagnosis not present

## 2022-10-12 ENCOUNTER — Other Ambulatory Visit: Payer: Self-pay | Admitting: Family Medicine

## 2022-10-12 ENCOUNTER — Ambulatory Visit (INDEPENDENT_AMBULATORY_CARE_PROVIDER_SITE_OTHER): Payer: Medicare Other

## 2022-10-12 ENCOUNTER — Ambulatory Visit
Admission: RE | Admit: 2022-10-12 | Discharge: 2022-10-12 | Disposition: A | Discharge status: Home / Self Care | Payer: Medicare Other | Source: Ambulatory Visit | Attending: Family Medicine | Admitting: Family Medicine

## 2022-10-12 DIAGNOSIS — I129 Hypertensive chronic kidney disease with stage 1 through stage 4 chronic kidney disease, or unspecified chronic kidney disease: Secondary | ICD-10-CM

## 2022-10-12 DIAGNOSIS — Z1231 Encounter for screening mammogram for malignant neoplasm of breast: Secondary | ICD-10-CM

## 2022-10-12 DIAGNOSIS — Z780 Asymptomatic menopausal state: Secondary | ICD-10-CM

## 2022-10-12 DIAGNOSIS — M85851 Other specified disorders of bone density and structure, right thigh: Secondary | ICD-10-CM | POA: Diagnosis not present

## 2022-10-12 DIAGNOSIS — M85852 Other specified disorders of bone density and structure, left thigh: Secondary | ICD-10-CM | POA: Diagnosis not present

## 2022-10-13 DIAGNOSIS — M25562 Pain in left knee: Secondary | ICD-10-CM | POA: Diagnosis not present

## 2022-10-13 DIAGNOSIS — M25662 Stiffness of left knee, not elsewhere classified: Secondary | ICD-10-CM | POA: Diagnosis not present

## 2022-10-15 DIAGNOSIS — M25662 Stiffness of left knee, not elsewhere classified: Secondary | ICD-10-CM | POA: Diagnosis not present

## 2022-10-15 DIAGNOSIS — M25562 Pain in left knee: Secondary | ICD-10-CM | POA: Diagnosis not present

## 2022-10-16 ENCOUNTER — Other Ambulatory Visit: Payer: Self-pay | Admitting: Family Medicine

## 2022-10-16 DIAGNOSIS — R928 Other abnormal and inconclusive findings on diagnostic imaging of breast: Secondary | ICD-10-CM

## 2022-10-20 DIAGNOSIS — M25662 Stiffness of left knee, not elsewhere classified: Secondary | ICD-10-CM | POA: Diagnosis not present

## 2022-10-20 DIAGNOSIS — M25562 Pain in left knee: Secondary | ICD-10-CM | POA: Diagnosis not present

## 2022-10-22 DIAGNOSIS — M25661 Stiffness of right knee, not elsewhere classified: Secondary | ICD-10-CM | POA: Diagnosis not present

## 2022-10-22 DIAGNOSIS — M25561 Pain in right knee: Secondary | ICD-10-CM | POA: Diagnosis not present

## 2022-11-03 ENCOUNTER — Ambulatory Visit
Admission: RE | Admit: 2022-11-03 | Discharge: 2022-11-03 | Disposition: A | Discharge status: Home / Self Care | Payer: Medicare Other | Source: Ambulatory Visit | Attending: Family Medicine | Admitting: Family Medicine

## 2022-11-03 ENCOUNTER — Ambulatory Visit: Payer: Medicare Other

## 2022-11-03 DIAGNOSIS — R928 Other abnormal and inconclusive findings on diagnostic imaging of breast: Secondary | ICD-10-CM | POA: Diagnosis not present

## 2022-12-21 DIAGNOSIS — D225 Melanocytic nevi of trunk: Secondary | ICD-10-CM | POA: Diagnosis not present

## 2022-12-21 DIAGNOSIS — D0471 Carcinoma in situ of skin of right lower limb, including hip: Secondary | ICD-10-CM | POA: Diagnosis not present

## 2022-12-21 DIAGNOSIS — L814 Other melanin hyperpigmentation: Secondary | ICD-10-CM | POA: Diagnosis not present

## 2022-12-21 DIAGNOSIS — L821 Other seborrheic keratosis: Secondary | ICD-10-CM | POA: Diagnosis not present

## 2022-12-21 DIAGNOSIS — L570 Actinic keratosis: Secondary | ICD-10-CM | POA: Diagnosis not present

## 2022-12-21 DIAGNOSIS — L304 Erythema intertrigo: Secondary | ICD-10-CM | POA: Diagnosis not present

## 2022-12-21 DIAGNOSIS — D692 Other nonthrombocytopenic purpura: Secondary | ICD-10-CM | POA: Diagnosis not present

## 2022-12-21 DIAGNOSIS — L905 Scar conditions and fibrosis of skin: Secondary | ICD-10-CM | POA: Diagnosis not present

## 2022-12-21 DIAGNOSIS — L565 Disseminated superficial actinic porokeratosis (DSAP): Secondary | ICD-10-CM | POA: Diagnosis not present

## 2023-01-05 ENCOUNTER — Other Ambulatory Visit: Payer: Self-pay | Admitting: Family Medicine

## 2023-01-05 DIAGNOSIS — E1122 Type 2 diabetes mellitus with diabetic chronic kidney disease: Secondary | ICD-10-CM

## 2023-01-18 ENCOUNTER — Other Ambulatory Visit: Payer: Self-pay | Admitting: Family Medicine

## 2023-01-18 DIAGNOSIS — L244 Irritant contact dermatitis due to drugs in contact with skin: Secondary | ICD-10-CM | POA: Diagnosis not present

## 2023-01-18 DIAGNOSIS — N183 Chronic kidney disease, stage 3 unspecified: Secondary | ICD-10-CM

## 2023-01-18 DIAGNOSIS — M1A39X0 Chronic gout due to renal impairment, multiple sites, without tophus (tophi): Secondary | ICD-10-CM

## 2023-02-11 ENCOUNTER — Encounter: Payer: Self-pay | Admitting: Family Medicine

## 2023-02-12 ENCOUNTER — Other Ambulatory Visit: Payer: Self-pay

## 2023-02-12 DIAGNOSIS — N1831 Chronic kidney disease, stage 3a: Secondary | ICD-10-CM

## 2023-02-12 DIAGNOSIS — E119 Type 2 diabetes mellitus without complications: Secondary | ICD-10-CM

## 2023-02-12 DIAGNOSIS — Z0000 Encounter for general adult medical examination without abnormal findings: Secondary | ICD-10-CM

## 2023-02-12 DIAGNOSIS — E785 Hyperlipidemia, unspecified: Secondary | ICD-10-CM

## 2023-02-12 DIAGNOSIS — N183 Chronic kidney disease, stage 3 unspecified: Secondary | ICD-10-CM

## 2023-02-16 ENCOUNTER — Other Ambulatory Visit: Payer: Medicare Other

## 2023-02-16 DIAGNOSIS — E119 Type 2 diabetes mellitus without complications: Secondary | ICD-10-CM

## 2023-02-16 DIAGNOSIS — E876 Hypokalemia: Secondary | ICD-10-CM

## 2023-02-16 DIAGNOSIS — R6889 Other general symptoms and signs: Secondary | ICD-10-CM | POA: Diagnosis not present

## 2023-02-16 DIAGNOSIS — E1169 Type 2 diabetes mellitus with other specified complication: Secondary | ICD-10-CM | POA: Diagnosis not present

## 2023-02-16 DIAGNOSIS — E785 Hyperlipidemia, unspecified: Secondary | ICD-10-CM | POA: Diagnosis not present

## 2023-02-16 DIAGNOSIS — E039 Hypothyroidism, unspecified: Secondary | ICD-10-CM | POA: Diagnosis not present

## 2023-02-16 LAB — TSH

## 2023-02-16 LAB — CBC WITH DIFFERENTIAL/PLATELET
Lymphocytes Absolute: 1.4 10*3/uL (ref 0.7–3.1)
MCH: 30.5 pg (ref 26.6–33.0)
MCV: 94 fL (ref 79–97)
Monocytes: 7 %
Platelets: 220 10*3/uL (ref 150–450)
RBC: 4.1 x10E6/uL (ref 3.77–5.28)
RDW: 14.7 % (ref 11.7–15.4)
WBC: 4.9 10*3/uL (ref 3.4–10.8)

## 2023-02-16 LAB — T4, FREE

## 2023-02-16 LAB — CMP14+EGFR
AST: 37 IU/L (ref 0–40)
Potassium: 4.3 mmol/L (ref 3.5–5.2)

## 2023-02-16 LAB — BAYER DCA HB A1C WAIVED: HB A1C (BAYER DCA - WAIVED): 7.2 % — ABNORMAL HIGH (ref 4.8–5.6)

## 2023-02-17 LAB — CMP14+EGFR
ALT: 38 IU/L — ABNORMAL HIGH (ref 0–32)
Albumin/Globulin Ratio: 1.6 (ref 1.2–2.2)
Albumin: 4.2 g/dL (ref 3.8–4.8)
Alkaline Phosphatase: 99 IU/L (ref 44–121)
BUN/Creatinine Ratio: 14 (ref 12–28)
BUN: 14 mg/dL (ref 8–27)
Bilirubin Total: 0.6 mg/dL (ref 0.0–1.2)
CO2: 20 mmol/L (ref 20–29)
Calcium: 9.9 mg/dL (ref 8.7–10.3)
Chloride: 102 mmol/L (ref 96–106)
Creatinine, Ser: 0.99 mg/dL (ref 0.57–1.00)
Globulin, Total: 2.7 g/dL (ref 1.5–4.5)
Glucose: 190 mg/dL — ABNORMAL HIGH (ref 70–99)
Sodium: 139 mmol/L (ref 134–144)
Total Protein: 6.9 g/dL (ref 6.0–8.5)
eGFR: 61 mL/min/{1.73_m2} (ref >59)

## 2023-02-17 LAB — CBC WITH DIFFERENTIAL/PLATELET
Basophils Absolute: 0.1 10*3/uL (ref 0.0–0.2)
Basos: 1 %
EOS (ABSOLUTE): 0.2 10*3/uL (ref 0.0–0.4)
Eos: 4 %
Hematocrit: 38.4 % (ref 34.0–46.6)
Hemoglobin: 12.5 g/dL (ref 11.1–15.9)
Immature Grans (Abs): 0 10*3/uL (ref 0.0–0.1)
Immature Granulocytes: 0 %
Lymphs: 28 %
MCHC: 32.6 g/dL (ref 31.5–35.7)
Monocytes Absolute: 0.4 10*3/uL (ref 0.1–0.9)
Neutrophils Absolute: 3 10*3/uL (ref 1.4–7.0)
Neutrophils: 60 %

## 2023-02-17 LAB — VITAMIN D 25 HYDROXY (VIT D DEFICIENCY, FRACTURES): Vit D, 25-Hydroxy: 29 ng/mL — ABNORMAL LOW (ref 30.0–100.0)

## 2023-02-19 ENCOUNTER — Encounter: Payer: Self-pay | Admitting: Family Medicine

## 2023-02-19 ENCOUNTER — Ambulatory Visit (INDEPENDENT_AMBULATORY_CARE_PROVIDER_SITE_OTHER): Payer: Medicare Other | Admitting: Family Medicine

## 2023-02-19 VITALS — BP 116/68 | HR 65 | Temp 98.4°F | Ht 63.0 in | Wt 198.0 lb

## 2023-02-19 DIAGNOSIS — N183 Chronic kidney disease, stage 3 unspecified: Secondary | ICD-10-CM

## 2023-02-19 DIAGNOSIS — E785 Hyperlipidemia, unspecified: Secondary | ICD-10-CM | POA: Diagnosis not present

## 2023-02-19 DIAGNOSIS — I129 Hypertensive chronic kidney disease with stage 1 through stage 4 chronic kidney disease, or unspecified chronic kidney disease: Secondary | ICD-10-CM | POA: Diagnosis not present

## 2023-02-19 DIAGNOSIS — E1122 Type 2 diabetes mellitus with diabetic chronic kidney disease: Secondary | ICD-10-CM

## 2023-02-19 DIAGNOSIS — Z2821 Immunization not carried out because of patient refusal: Secondary | ICD-10-CM

## 2023-02-19 DIAGNOSIS — G720 Drug-induced myopathy: Secondary | ICD-10-CM | POA: Diagnosis not present

## 2023-02-19 DIAGNOSIS — M7732 Calcaneal spur, left foot: Secondary | ICD-10-CM

## 2023-02-19 DIAGNOSIS — M1A39X0 Chronic gout due to renal impairment, multiple sites, without tophus (tophi): Secondary | ICD-10-CM

## 2023-02-19 DIAGNOSIS — T466X5A Adverse effect of antihyperlipidemic and antiarteriosclerotic drugs, initial encounter: Secondary | ICD-10-CM

## 2023-02-19 DIAGNOSIS — Z0001 Encounter for general adult medical examination with abnormal findings: Secondary | ICD-10-CM

## 2023-02-19 DIAGNOSIS — Z0000 Encounter for general adult medical examination without abnormal findings: Secondary | ICD-10-CM

## 2023-02-19 DIAGNOSIS — E1169 Type 2 diabetes mellitus with other specified complication: Secondary | ICD-10-CM

## 2023-02-19 MED ORDER — LANCET DEVICE MISC
0 refills | Status: DC
Start: 2023-02-19 — End: 2024-02-21

## 2023-02-19 MED ORDER — FUROSEMIDE 20 MG PO TABS: 20.0000 mg | ORAL_TABLET | Freq: Every day | ORAL | 3 refills | Status: DC

## 2023-02-19 MED ORDER — GABAPENTIN 300 MG PO CAPS: 300.0000 mg | ORAL_CAPSULE | Freq: Every evening | ORAL | 3 refills | Status: AC | PRN

## 2023-02-19 MED ORDER — LANCETS MISC. MISC
3 refills | Status: DC
Start: 2023-02-19 — End: 2023-12-13

## 2023-02-19 MED ORDER — BLOOD GLUCOSE MONITORING SUPPL DEVI
0 refills | Status: DC
Start: 2023-02-19 — End: 2024-03-20

## 2023-02-19 MED ORDER — ALLOPURINOL 100 MG PO TABS: 100.0000 mg | ORAL_TABLET | Freq: Every day | ORAL | 3 refills | Status: DC

## 2023-02-19 MED ORDER — POTASSIUM CHLORIDE CRYS ER 20 MEQ PO TBCR: 20.0000 meq | EXTENDED_RELEASE_TABLET | Freq: Every day | ORAL | 3 refills | Status: DC | PRN

## 2023-02-19 MED ORDER — COLCHICINE 0.6 MG PO TABS
ORAL_TABLET | ORAL | 99 refills | Status: DC
Start: 2023-02-19 — End: 2024-02-21

## 2023-02-19 MED ORDER — BLOOD GLUCOSE TEST VI STRP
ORAL_STRIP | 3 refills | Status: DC
Start: 2023-02-19 — End: 2023-12-13

## 2023-02-19 MED ORDER — ATENOLOL 25 MG PO TABS
25.0000 mg | ORAL_TABLET | Freq: Every day | ORAL | 3 refills | Status: DC
Start: 2023-02-19 — End: 2024-02-21

## 2023-02-19 NOTE — Progress Notes (Signed)
Kristie Lewis is a 72 y.o. female who is accompanied to today's visit by her spouse.  She is presents to office today for annual physical exam examination.    Concerns today include: 1. Type 2 Diabetes with hypertension, hyperlipidemia associate with CKD:  Not monitoring blood sugars at home and would like to get a meter.  She has been trying to cut out carbs since her initial visit but notes that she had a fell off the wagon over the last several months.  She is gone back to her normal eating and really is motivated not to be on any type of medication so is going to try and get rid of carbs again and increase physical exercise.  She gardens a lot and will be getting lots of fresh fruits and vegetables this summer.  She hopes to get back to riding her stationary bike more.  She is compliant with her atenolol, Lasix and potassium.  Last eye exam: needs.  Scheduled next month Last foot exam: needs Last A1c:  Lab Results  Component Value Date   HGBA1C 7.2 (H) 02/16/2023   Nephropathy screen indicated?: UTD Last flu, zoster and/or pneumovax:  Immunization History  Administered Date(s) Administered   Td 04/11/1999   Td (Adult),5 Lf Tetanus Toxid, Preservative Free 04/11/1999    ROS: Denies dizziness, LOC, polyuria, polydipsia, unintended weight loss/gain, foot ulcerations, numbness or tingling in extremities, shortness of breath or chest pain.   Occupation: retired, Marital status: married, Substance use: none Diet: not restricted currently, Exercise: gardening/ bike Last eye exam: scheduled Last dental exam: UTD Last colonoscopy: UTD Last mammogram: UTD Last pap smear: UTD Refills needed today: all Immunizations needed: Immunization History  Administered Date(s) Administered   Td 04/11/1999   Td (Adult),5 Lf Tetanus Toxid, Preservative Free 04/11/1999     Past Medical History:  Diagnosis Date   Arthritis    Cataract    GERD (gastroesophageal reflux disease)    Gout     Hyperlipidemia    Hypertension    Microscopic colitis    Renal cancer (HCC) 2010   Social History   Socioeconomic History   Marital status: Married    Spouse name: Guerrant   Number of children: 2   Years of education: Not on file   Highest education level: Not on file  Occupational History   Occupation: Retired    Comment: Charity fundraiser  Tobacco Use   Smoking status: Never   Smokeless tobacco: Never  Vaping Use   Vaping Use: Never used  Substance and Sexual Activity   Alcohol use: No   Drug use: No   Sexual activity: Not on file  Other Topics Concern   Not on file  Social History Narrative   Married x 25 years.    2 children   3 step-children   9 grandchildren   Social Determinants of Health   Financial Resource Strain: Low Risk  (08/28/2022)   Overall Financial Resource Strain (CARDIA)    Difficulty of Paying Living Expenses: Not hard at all  Food Insecurity: No Food Insecurity (08/28/2022)   Hunger Vital Sign    Worried About Running Out of Food in the Last Year: Never true    Ran Out of Food in the Last Year: Never true  Transportation Needs: No Transportation Needs (08/28/2022)   PRAPARE - Administrator, Civil Service (Medical): No    Lack of Transportation (Non-Medical): No  Physical Activity: Insufficiently Active (08/28/2022)   Exercise Vital Sign  Days of Exercise per Week: 3 days    Minutes of Exercise per Session: 30 min  Stress: No Stress Concern Present (08/28/2022)   [employer] of Occupational Health - Occupational Stress Questionnaire    Feeling of Stress : Not at all  Social Connections: Moderately Isolated (08/28/2022)   Social Connection and Isolation Panel [NHANES]    Frequency of Communication with Friends and Family: More than three times a week    Frequency of Social Gatherings with Friends and Family: More than three times a week    Attends Religious Services: Never    Database administrator or Organizations: No    Attends  Banker Meetings: Never    Marital Status: Married  Catering manager Violence: Not At Risk (08/28/2022)   Humiliation, Afraid, Rape, and Kick questionnaire    Fear of Current or Ex-Partner: No    Emotionally Abused: No    Physically Abused: No    Sexually Abused: No   Past Surgical History:  Procedure Laterality Date   BREAST EXCISIONAL BIOPSY Right 1971   BREAST SURGERY     right breast bx   CESAREAN SECTION     3 c-sections   COLONOSCOPY     HERNIA REPAIR     umbilical    NEPHRECTOMY Right    RIGHT KNEE ARTHROSCOPY     TUBAL LIGATION     UPPER GASTROINTESTINAL ENDOSCOPY     Family History  Problem Relation Age of Onset   Hyperlipidemia Mother    Stroke Mother    Diabetes Father    Heart disease Father    Hypertension Father    Stroke Father    Colon cancer Neg Hx    Esophageal cancer Neg Hx    Stomach cancer Neg Hx    Rectal cancer Neg Hx     Current Outpatient Medications:    allopurinol (ZYLOPRIM) 100 MG tablet, TAKE ONE (1) TABLET BY MOUTH EVERY DAY, Disp: 90 tablet, Rfl: 0   aspirin EC 81 MG tablet, Take 81 mg by mouth 2 (two) times daily., Disp: , Rfl:    atenolol (TENORMIN) 25 MG tablet, TAKE ONE (1) TABLET BY MOUTH EVERY DAY, Disp: 90 tablet, Rfl: 0   B Complex Vitamins (B-COMPLEX/B-12 PO), Take by mouth., Disp: , Rfl:    colchicine 0.6 MG tablet, TAKE 2 TABLETS AT THE ONSET OF ATTACK THEN 1 TAB EVERY 2 HOURS AS NEEDED UP TO 6 TABLETS THEN TAKE TWICE DAILY UNTIL SYMPTOMS CLEAR, Disp: 30 tablet, Rfl: 0   furosemide (LASIX) 20 MG tablet, TAKE ONE (1) TABLET BY MOUTH EVERY DAY, Disp: 90 tablet, Rfl: 0   gabapentin (NEURONTIN) 300 MG capsule, Take 1 capsule (300 mg total) by mouth at bedtime., Disp: 30 capsule, Rfl: 2   KRILL OIL 1000 MG CAPS, Take by mouth., Disp: , Rfl:    L-Lysine 1000 MG TABS, Take by mouth., Disp: , Rfl:    Misc Natural Products (OSTEO BI-FLEX ADV DOUBLE ST PO), Take 2 capsules by mouth daily. 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  This is being managed by her dermatologist and is status post treatment with 5-FU Musculoskeletal: positive for arthralgias and stiff joints    Physical exam BP 116/68   Pulse 65   Temp 98.4 F (36.9 C)   Ht 5\' 3"  (1.6 m)   Wt 198 lb (89.8 kg)   SpO2 97%   BMI 35.07 kg/m  General appearance: alert, cooperative, appears stated age, and no distress Head: Normocephalic, without obvious abnormality, atraumatic Eyes: negative findings: lids and lashes normal, conjunctivae and sclerae normal, corneas clear, and pupils equal, round, reactive to light and accomodation Ears: normal TM's and external ear canals both ears Nose: Nares normal. Septum midline. Mucosa normal. No drainage or sinus tenderness. Throat: lips, mucosa, and tongue normal; teeth and gums normal Neck: no adenopathy, no carotid bruit, supple, symmetrical, trachea midline, and thyroid not enlarged, symmetric, no tenderness/mass/nodules Back: symmetric, no curvature. ROM normal. No CVA tenderness. Lungs: clear to auscultation bilaterally Heart: regular rate and rhythm, S1, S2 normal, no murmur, click, rub or gallop Abdomen:  Obese, soft, mild tenderness to palpation in the right upper quadrant.  No rebound or guarding Extremities: extremities normal, atraumatic, no cyanosis or edema Pulses: 2+ and symmetric Skin:  Bandage to right anterior shin.  She has varicose veins along medial lower legs  bilaterally Lymph nodes: Cervical, supraclavicular, and axillary nodes normal. Neurologic: Grossly normal Psych: Mood stable, speech normal, affect appropriate.  Eye contact fair  Diabetic Foot Exam - Simple   Simple Foot Form Diabetic Foot exam was performed with the following findings: Yes 02/19/2023  9:33 AM  Visual Inspection No deformities, no ulcerations, no other skin breakdown bilaterally: Yes See comments: Yes Sensation Testing Intact to touch and monofilament testing bilaterally: Yes Pulse Check Posterior Tibialis and Dorsalis pulse intact bilaterally: Yes Comments Crack of the skin of left medial plantar aspect of the ball of her foot.    Assessment/ Plan: Ivory Broad here for annual physical exam.   Annual physical exam  No vaccination-pt refuse  Type 2 diabetes mellitus with other specified complication, without long-term current use of insulin (HCC) - Plan: Blood Glucose Monitoring Suppl DEVI, Glucose Blood (BLOOD GLUCOSE TEST STRIPS) STRP, Lancet Device MISC, Lancets Misc. MISC  Hypertension associated with stage 3 chronic kidney disease due to type 2 diabetes mellitus (HCC) - Plan: atenolol (TENORMIN) 25 MG tablet, colchicine 0.6 MG tablet, furosemide (LASIX) 20 MG tablet, potassium chloride SA (KLOR-CON M) 20 MEQ tablet  Chronic gout due to renal impairment of multiple sites without tophus - Plan: allopurinol (ZYLOPRIM) 100 MG tablet, colchicine 0.6 MG tablet  Hyperlipidemia associated with type 2 diabetes mellitus (HCC)  Statin myopathy  Heel spur, left  Declines vaccinations.  Has eye exam set up.  Foot exam performed today.  I gave her a prescription for diabetic testing supplies.  Reinforced importance of carb restriction as she is motivated not to be on any type of medication for her diabetes and or cholesterol.  She understands the risks associated with uncontrolled sugar, cholesterol and accepts these risks.  Blood pressure is well-controlled.  No  changes needed.  Rx's have been renewed  Gout has been quiet.  Allopurinol and as needed colchicine sent  Considering seeing [facility] for that left heel.  She will let me  know if she needs referral  Counseled on healthy lifestyle choices, including diet (rich in fruits, vegetables and lean meats and low in salt and simple carbohydrates) and exercise (at least 30 minutes of moderate physical activity daily).  Patient to follow up in 49m for DM recheck  Akeel Reffner M. Nadine Counts, DO

## 2023-02-19 NOTE — Patient Instructions (Signed)
Please have your eye doctor send me results.  Glucose meter sent to pharmacy.  Check sugar daily.  Plan to see me in 3 months for recheck.  Goal A1c <7.0  Continue to monitor your blood sugars as we discussed.    Understanding your Hemoglobin A1c:     Diabetes Mellitus and Nutrition When you have diabetes (diabetes mellitus), it is very important to have healthy eating habits because your blood sugar (glucose) levels are greatly affected by what you eat and drink. Eating healthy foods in the appropriate amounts, at about the same times every day, can help you: Control your blood glucose. Lower your risk of heart disease. Improve your blood pressure. Reach or maintain a healthy weight.  Every person with diabetes is different, and each person has different needs for a meal plan. Your health care provider may recommend that you work with a diet and nutrition specialist (dietitian) to make a meal plan that is best for you. Your meal plan may vary depending on factors such as: The calories you need. The medicines you take. Your weight. Your blood glucose, blood pressure, and cholesterol levels. Your activity level. Other health conditions you have, such as heart or kidney disease.  How do carbohydrates affect me? Carbohydrates affect your blood glucose level more than any other type of food. Eating carbohydrates naturally increases the amount of glucose in your blood. Carbohydrate counting is a method for keeping track of how many carbohydrates you eat. Counting carbohydrates is important to keep your blood glucose at a healthy level, especially if you use insulin or take certain oral diabetes medicines. It is important to know how many carbohydrates you can safely have in each meal. This is different for every person. Your dietitian can help you calculate how many carbohydrates you should have at each meal and for snack. Foods that contain carbohydrates include: Bread, cereal, rice, pasta,  and crackers. Potatoes and corn. Peas, beans, and lentils. Milk and yogurt. Fruit and juice. Desserts, such as cakes, cookies, ice cream, and candy.  How does alcohol affect me? Alcohol can cause a sudden decrease in blood glucose (hypoglycemia), especially if you use insulin or take certain oral diabetes medicines. Hypoglycemia can be a life-threatening condition. Symptoms of hypoglycemia (sleepiness, dizziness, and confusion) are similar to symptoms of having too much alcohol. If your health care provider says that alcohol is safe for you, follow these guidelines: Limit alcohol intake to no more than 1 drink per day for nonpregnant women and 2 drinks per day for men. One drink equals 12 oz of beer, 5 oz of wine, or 1 oz of hard liquor. Do not drink on an empty stomach. Keep yourself hydrated with water, diet soda, or unsweetened iced tea. Keep in mind that regular soda, juice, and other mixers may contain a lot of sugar and must be counted as carbohydrates.  What are tips for following this plan? Reading food labels Start by checking the serving size on the label. The amount of calories, carbohydrates, fats, and other nutrients listed on the label are based on one serving of the food. Many foods contain more than one serving per package. Check the total grams (g) of carbohydrates in one serving. You can calculate the number of servings of carbohydrates in one serving by dividing the total carbohydrates by 15. For example, if a food has 30 g of total carbohydrates, it would be equal to 2 servings of carbohydrates. Check the number of grams (g) of saturated and  trans fats in one serving. Choose foods that have low or no amount of these fats. Check the number of milligrams (mg) of sodium in one serving. Most people should limit total sodium intake to less than 2,300 mg per day. Always check the nutrition information of foods labeled as "low-fat" or "nonfat". These foods may be higher in added  sugar or refined carbohydrates and should be avoided. Talk to your dietitian to identify your daily goals for nutrients listed on the label. Shopping Avoid buying canned, premade, or processed foods. These foods tend to be high in fat, sodium, and added sugar. Shop around the outside edge of the grocery store. This includes fresh fruits and vegetables, bulk grains, fresh meats, and fresh dairy. Cooking Use low-heat cooking methods, such as baking, instead of high-heat cooking methods like deep frying. Cook using healthy oils, such as olive, canola, or sunflower oil. Avoid cooking with butter, cream, or high-fat meats. Meal planning Eat meals and snacks regularly, preferably at the same times every day. Avoid going long periods of time without eating. Eat foods high in fiber, such as fresh fruits, vegetables, beans, and whole grains. Talk to your dietitian about how many servings of carbohydrates you can eat at each meal. Eat 4-6 ounces of lean protein each day, such as lean meat, chicken, fish, eggs, or tofu. 1 ounce is equal to 1 ounce of meat, chicken, or fish, 1 egg, or 1/4 cup of tofu. Eat some foods each day that contain healthy fats, such as avocado, nuts, seeds, and fish. Lifestyle  Check your blood glucose regularly. Exercise at least 30 minutes 5 or more days each week, or as told by your health care provider. Take medicines as told by your health care provider. Do not use any products that contain nicotine or tobacco, such as cigarettes and e-cigarettes. If you need help quitting, ask your health care provider. Work with a Veterinary surgeon or diabetes educator to identify strategies to manage stress and any emotional and social challenges. What are some questions to ask my health care provider? Do I need to meet with a diabetes educator? Do I need to meet with a dietitian? What number can I call if I have questions? When are the best times to check my blood glucose? Where to find more  information: American Diabetes Association: diabetes.org/food-and-fitness/food Academy of Nutrition and Dietetics: https://www.vargas.com/ [employer] of Diabetes and Digestive and Kidney Diseases (NIH): FindJewelers.cz Summary A healthy meal plan will help you control your blood glucose and maintain a healthy lifestyle. Working with a diet and nutrition specialist (dietitian) can help you make a meal plan that is best for you. Keep in mind that carbohydrates and alcohol have immediate effects on your blood glucose levels. It is important to count carbohydrates and to use alcohol carefully. This information is not intended to replace advice given to you by your health care provider. Make sure you discuss any questions you have with your health care provider. Document Released: 05/28/2005 Document Revised: 10/05/2016 Document Reviewed: 10/05/2016 Elsevier Interactive Patient Education  [employer].

## 2023-03-08 DIAGNOSIS — M7662 Achilles tendinitis, left leg: Secondary | ICD-10-CM | POA: Diagnosis not present

## 2023-03-08 DIAGNOSIS — M79672 Pain in left foot: Secondary | ICD-10-CM | POA: Diagnosis not present

## 2023-03-09 DIAGNOSIS — R269 Unspecified abnormalities of gait and mobility: Secondary | ICD-10-CM | POA: Diagnosis not present

## 2023-03-09 DIAGNOSIS — M7662 Achilles tendinitis, left leg: Secondary | ICD-10-CM | POA: Diagnosis not present

## 2023-03-09 DIAGNOSIS — M79672 Pain in left foot: Secondary | ICD-10-CM | POA: Diagnosis not present

## 2023-03-11 DIAGNOSIS — R269 Unspecified abnormalities of gait and mobility: Secondary | ICD-10-CM | POA: Diagnosis not present

## 2023-03-11 DIAGNOSIS — M7662 Achilles tendinitis, left leg: Secondary | ICD-10-CM | POA: Diagnosis not present

## 2023-03-11 DIAGNOSIS — M79672 Pain in left foot: Secondary | ICD-10-CM | POA: Diagnosis not present

## 2023-03-15 DIAGNOSIS — R269 Unspecified abnormalities of gait and mobility: Secondary | ICD-10-CM | POA: Diagnosis not present

## 2023-03-15 DIAGNOSIS — M79672 Pain in left foot: Secondary | ICD-10-CM | POA: Diagnosis not present

## 2023-03-17 DIAGNOSIS — M79672 Pain in left foot: Secondary | ICD-10-CM | POA: Diagnosis not present

## 2023-03-17 DIAGNOSIS — R269 Unspecified abnormalities of gait and mobility: Secondary | ICD-10-CM | POA: Diagnosis not present

## 2023-03-22 DIAGNOSIS — D0471 Carcinoma in situ of skin of right lower limb, including hip: Secondary | ICD-10-CM | POA: Diagnosis not present

## 2023-03-22 DIAGNOSIS — L570 Actinic keratosis: Secondary | ICD-10-CM | POA: Diagnosis not present

## 2023-03-23 DIAGNOSIS — M79672 Pain in left foot: Secondary | ICD-10-CM | POA: Diagnosis not present

## 2023-03-23 DIAGNOSIS — R269 Unspecified abnormalities of gait and mobility: Secondary | ICD-10-CM | POA: Diagnosis not present

## 2023-03-25 DIAGNOSIS — R269 Unspecified abnormalities of gait and mobility: Secondary | ICD-10-CM | POA: Diagnosis not present

## 2023-03-25 DIAGNOSIS — M79672 Pain in left foot: Secondary | ICD-10-CM | POA: Diagnosis not present

## 2023-03-30 DIAGNOSIS — R269 Unspecified abnormalities of gait and mobility: Secondary | ICD-10-CM | POA: Diagnosis not present

## 2023-03-30 DIAGNOSIS — M79672 Pain in left foot: Secondary | ICD-10-CM | POA: Diagnosis not present

## 2023-04-01 DIAGNOSIS — R269 Unspecified abnormalities of gait and mobility: Secondary | ICD-10-CM | POA: Diagnosis not present

## 2023-04-01 DIAGNOSIS — M79672 Pain in left foot: Secondary | ICD-10-CM | POA: Diagnosis not present

## 2023-04-06 DIAGNOSIS — M7662 Achilles tendinitis, left leg: Secondary | ICD-10-CM | POA: Diagnosis not present

## 2023-04-06 DIAGNOSIS — M79672 Pain in left foot: Secondary | ICD-10-CM | POA: Diagnosis not present

## 2023-04-06 DIAGNOSIS — R269 Unspecified abnormalities of gait and mobility: Secondary | ICD-10-CM | POA: Diagnosis not present

## 2023-04-23 ENCOUNTER — Encounter: Payer: Self-pay | Admitting: Family Medicine

## 2023-04-23 DIAGNOSIS — E559 Vitamin D deficiency, unspecified: Secondary | ICD-10-CM

## 2023-04-23 DIAGNOSIS — E1169 Type 2 diabetes mellitus with other specified complication: Secondary | ICD-10-CM

## 2023-04-26 DIAGNOSIS — E119 Type 2 diabetes mellitus without complications: Secondary | ICD-10-CM | POA: Diagnosis not present

## 2023-04-26 LAB — HM DIABETES EYE EXAM

## 2023-05-06 ENCOUNTER — Encounter: Payer: Self-pay | Admitting: Family Medicine

## 2023-05-06 DIAGNOSIS — M7662 Achilles tendinitis, left leg: Secondary | ICD-10-CM | POA: Diagnosis not present

## 2023-05-21 ENCOUNTER — Telehealth: Payer: Self-pay | Admitting: Family Medicine

## 2023-05-24 ENCOUNTER — Ambulatory Visit: Payer: Medicare Other | Admitting: Family Medicine

## 2023-05-25 DIAGNOSIS — R269 Unspecified abnormalities of gait and mobility: Secondary | ICD-10-CM | POA: Diagnosis not present

## 2023-05-25 DIAGNOSIS — M79672 Pain in left foot: Secondary | ICD-10-CM | POA: Diagnosis not present

## 2023-06-01 DIAGNOSIS — R269 Unspecified abnormalities of gait and mobility: Secondary | ICD-10-CM | POA: Diagnosis not present

## 2023-06-01 DIAGNOSIS — M79672 Pain in left foot: Secondary | ICD-10-CM | POA: Diagnosis not present

## 2023-06-07 ENCOUNTER — Other Ambulatory Visit: Payer: Medicare Other

## 2023-06-07 DIAGNOSIS — E1169 Type 2 diabetes mellitus with other specified complication: Secondary | ICD-10-CM | POA: Diagnosis not present

## 2023-06-07 DIAGNOSIS — E785 Hyperlipidemia, unspecified: Secondary | ICD-10-CM

## 2023-06-07 DIAGNOSIS — E559 Vitamin D deficiency, unspecified: Secondary | ICD-10-CM | POA: Diagnosis not present

## 2023-06-07 LAB — BAYER DCA HB A1C WAIVED: HB A1C (BAYER DCA - WAIVED): 6 % — ABNORMAL HIGH (ref 4.8–5.6)

## 2023-06-08 DIAGNOSIS — R269 Unspecified abnormalities of gait and mobility: Secondary | ICD-10-CM | POA: Diagnosis not present

## 2023-06-08 DIAGNOSIS — M79672 Pain in left foot: Secondary | ICD-10-CM | POA: Diagnosis not present

## 2023-06-08 LAB — CMP14+EGFR
ALT: 25 IU/L (ref 0–32)
AST: 22 IU/L (ref 0–40)
Albumin: 4.1 g/dL (ref 3.8–4.8)
Alkaline Phosphatase: 97 IU/L (ref 44–121)
BUN/Creatinine Ratio: 16 (ref 12–28)
BUN: 17 mg/dL (ref 8–27)
Bilirubin Total: 0.5 mg/dL (ref 0.0–1.2)
CO2: 20 mmol/L (ref 20–29)
Calcium: 9.8 mg/dL (ref 8.7–10.3)
Chloride: 104 mmol/L (ref 96–106)
Creatinine, Ser: 1.07 mg/dL — ABNORMAL HIGH (ref 0.57–1.00)
Globulin, Total: 2.7 g/dL (ref 1.5–4.5)
Glucose: 119 mg/dL — ABNORMAL HIGH (ref 70–99)
Potassium: 4.4 mmol/L (ref 3.5–5.2)
Sodium: 139 mmol/L (ref 134–144)
Total Protein: 6.8 g/dL (ref 6.0–8.5)
eGFR: 55 mL/min/{1.73_m2} — ABNORMAL LOW (ref >59)

## 2023-06-08 LAB — LIPID PANEL
Chol/HDL Ratio: 7.4 ratio — ABNORMAL HIGH (ref 0.0–4.4)
Cholesterol, Total: 273 mg/dL — ABNORMAL HIGH (ref 100–199)
HDL: 37 mg/dL — ABNORMAL LOW (ref >39)
LDL Chol Calc (NIH): 191 mg/dL — ABNORMAL HIGH (ref 0–99)
Triglycerides: 231 mg/dL — ABNORMAL HIGH (ref 0–149)
VLDL Cholesterol Cal: 45 mg/dL — ABNORMAL HIGH (ref 5–40)

## 2023-06-08 LAB — VITAMIN D 25 HYDROXY (VIT D DEFICIENCY, FRACTURES): Vit D, 25-Hydroxy: 46.4 ng/mL (ref 30.0–100.0)

## 2023-06-14 ENCOUNTER — Encounter: Payer: Self-pay | Admitting: Family Medicine

## 2023-06-14 ENCOUNTER — Ambulatory Visit (INDEPENDENT_AMBULATORY_CARE_PROVIDER_SITE_OTHER): Payer: Medicare Other | Admitting: Family Medicine

## 2023-06-14 VITALS — BP 98/59 | HR 70 | Temp 98.3°F | Ht 63.0 in | Wt 189.4 lb

## 2023-06-14 DIAGNOSIS — Z5320 Procedure and treatment not carried out because of patient's decision for unspecified reasons: Secondary | ICD-10-CM | POA: Diagnosis not present

## 2023-06-14 DIAGNOSIS — E1122 Type 2 diabetes mellitus with diabetic chronic kidney disease: Secondary | ICD-10-CM | POA: Diagnosis not present

## 2023-06-14 DIAGNOSIS — E1169 Type 2 diabetes mellitus with other specified complication: Secondary | ICD-10-CM

## 2023-06-14 DIAGNOSIS — E785 Hyperlipidemia, unspecified: Secondary | ICD-10-CM | POA: Diagnosis not present

## 2023-06-14 DIAGNOSIS — Z2821 Immunization not carried out because of patient refusal: Secondary | ICD-10-CM | POA: Diagnosis not present

## 2023-06-14 DIAGNOSIS — E119 Type 2 diabetes mellitus without complications: Secondary | ICD-10-CM

## 2023-06-14 DIAGNOSIS — I129 Hypertensive chronic kidney disease with stage 1 through stage 4 chronic kidney disease, or unspecified chronic kidney disease: Secondary | ICD-10-CM | POA: Diagnosis not present

## 2023-06-14 DIAGNOSIS — N183 Chronic kidney disease, stage 3 unspecified: Secondary | ICD-10-CM

## 2023-06-14 NOTE — Progress Notes (Signed)
Subjective: CC:DM PCP: Raliegh Ip, DO WJX:BJYNW W Nop is a 72 y.o. female who is accompanied by her spouse.  She is presenting to clinic today for:  1. Type 2 Diabetes with hypertension, hyperlipidemia w/ CKD3a (due to R sided nephrectomy):  DM is diet controlled. She has really been watching her sweets.  She is exercising on a recumbent bike daily.    Diabetes Health Maintenance Due  Topic Date Due   OPHTHALMOLOGY EXAM  Never done   HEMOGLOBIN A1C  12/05/2023   FOOT EXAM  02/19/2024    Last A1c:  Lab Results  Component Value Date   HGBA1C 6.0 (H) 06/07/2023    ROS: Denies dizziness, LOC, polyuria, polydipsia, unintended weight loss/gain, foot ulcerations, numbness or tingling in extremities, shortness of breath or chest pain.   ROS: Per HPI  No Known Allergies Past Medical History:  Diagnosis Date   Arthritis    Cataract    GERD (gastroesophageal reflux disease)    Gout    Hyperlipidemia    Hypertension    Microscopic colitis    Renal cancer (HCC) 2010    Current Outpatient Medications:    allopurinol (ZYLOPRIM) 100 MG tablet, Take 1 tablet (100 mg total) by mouth daily., Disp: 100 tablet, Rfl: 3   aspirin EC 81 MG tablet, Take 81 mg by mouth 2 (two) times daily., Disp: , Rfl:    atenolol (TENORMIN) 25 MG tablet, Take 1 tablet (25 mg total) by mouth daily., Disp: 100 tablet, Rfl: 3   B Complex Vitamins (B-COMPLEX/B-12 PO), Take by mouth., Disp: , Rfl:    Blood Glucose Monitoring Suppl DEVI, Check BGs daily. E11.69. May substitute to any manufacturer covered by patient's insurance., Disp: 1 each, Rfl: 0   colchicine 0.6 MG tablet, TAKE 2 TABLETS AT THE ONSET OF ATTACK THEN 1 TAB EVERY 2 HOURS AS NEEDED UP TO 6 TABLETS THEN TAKE TWICE DAILY UNTIL SYMPTOMS CLEAR, Disp: 30 tablet, Rfl: PRN   furosemide (LASIX) 20 MG tablet, Take 1 tablet (20 mg total) by mouth daily., Disp: 100 tablet, Rfl: 3   gabapentin (NEURONTIN) 300 MG capsule, Take 1 capsule (300 mg  total) by mouth at bedtime as needed., Disp: 90 capsule, Rfl: 3   Glucose Blood (BLOOD GLUCOSE TEST STRIPS) STRP, CHeck BGs daily. E11.69. May substitute to any manufacturer covered by patient's insurance., Disp: 100 strip, Rfl: 3   KRILL OIL 1000 MG CAPS, Take by mouth., Disp: , Rfl:    L-Lysine 1000 MG TABS, Take by mouth., Disp: , Rfl:    Lancet Device MISC, Check BGs daily, E11.69. May substitute to any manufacturer covered by patient's insurance., Disp: 1 each, Rfl: 0   Lancets Misc. MISC, Check BGs daily E11.69. May substitute to any manufacturer covered by patient's insurance., Disp: 100 each, Rfl: 3   Misc Natural Products (OSTEO BI-FLEX ADV DOUBLE ST PO), Take 2 capsules by mouth daily. , Disp: , Rfl:    potassium chloride SA (KLOR-CON M) 20 MEQ tablet, Take 1 tablet (20 mEq total) by mouth daily as needed (lasix)., Disp: 100 tablet, Rfl: 3   UNABLE TO FIND, systane eye drops, Disp: , Rfl:    vitamin C (ASCORBIC ACID) 500 MG tablet, Take 500 mg by mouth daily., Disp: , Rfl:    VITAMIN D PO, Take by mouth., Disp: , Rfl:    zinc gluconate 50 MG tablet, Take 50 mg by mouth daily., Disp: , Rfl:  Social History   Socioeconomic History  Marital status: Married    Spouse name: Guerrant   Number of children: 2   Years of education: Not on file   Highest education level: Not on file  Occupational History   Occupation: Retired    Comment: Charity fundraiser  Tobacco Use   Smoking status: Never   Smokeless tobacco: Never  Vaping Use   Vaping status: Never Used  Substance and Sexual Activity   Alcohol use: No   Drug use: No   Sexual activity: Not on file  Other Topics Concern   Not on file  Social History Narrative   Married x 25 years.    2 children   3 step-children   9 grandchildren   Social Determinants of Health   Financial Resource Strain: Low Risk  (08/28/2022)   Overall Financial Resource Strain (CARDIA)    Difficulty of Paying Living Expenses: Not hard at all  Food Insecurity: No  Food Insecurity (08/28/2022)   Hunger Vital Sign    Worried About Running Out of Food in the Last Year: Never true    Ran Out of Food in the Last Year: Never true  Transportation Needs: No Transportation Needs (08/28/2022)   PRAPARE - Administrator, Civil Service (Medical): No    Lack of Transportation (Non-Medical): No  Physical Activity: Insufficiently Active (08/28/2022)   Exercise Vital Sign    Days of Exercise per Week: 3 days    Minutes of Exercise per Session: 30 min  Stress: No Stress Concern Present (08/28/2022)   [employer] of Occupational Health - Occupational Stress Questionnaire    Feeling of Stress : Not at all  Social Connections: Moderately Isolated (08/28/2022)   Social Connection and Isolation Panel [NHANES]    Frequency of Communication with Friends and Family: More than three times a week    Frequency of Social Gatherings with Friends and Family: More than three times a week    Attends Religious Services: Never    Database administrator or Organizations: No    Attends Banker Meetings: Never    Marital Status: Married  Catering manager Violence: Not At Risk (08/28/2022)   Humiliation, Afraid, Rape, and Kick questionnaire    Fear of Current or Ex-Partner: No    Emotionally Abused: No    Physically Abused: No    Sexually Abused: No   Family History  Problem Relation Age of Onset   Hyperlipidemia Mother    Stroke Mother    Diabetes Father    Heart disease Father    Hypertension Father    Stroke Father    Colon cancer Neg Hx    Esophageal cancer Neg Hx    Stomach cancer Neg Hx    Rectal cancer Neg Hx     Objective: Office vital signs reviewed. BP (!) 98/59   Pulse 70   Temp 98.3 F (36.8 C)   Ht 5\' 3"  (1.6 m)   Wt 189 lb 6.4 oz (85.9 kg)   SpO2 95%   BMI 33.55 kg/m   Physical Examination:  General: Awake, alert, well nourished, No acute distress HEENT: sclera white, MMM Cardio: regular rate and rhythm, S1S2  heard, no murmurs appreciated Pulm: clear to auscultation bilaterally, no wheezes, rhonchi or rales; normal work of breathing on room air GI: soft, non-tender, non-distended, bowel sounds present x4, no hepatomegaly, no splenomegaly, no masses Extremities: warm, well perfused, No edema, cyanosis or clubbing; +2 pulses bilaterally    Assessment/ Plan: 72 y.o. female  Diet-controlled diabetes mellitus (HCC) - Plan: Microalbumin / creatinine urine ratio  Hyperlipidemia associated with type 2 diabetes mellitus (HCC)  Refusal of statin medication by patient  Hypertension associated with stage 3 chronic kidney disease due to type 2 diabetes mellitus (HCC) - Plan: Microalbumin / creatinine urine ratio  No vaccination-pt refuse  Diet controlled DM with A1c 6.0 today.  Discussed Farxiga/ Jardiance for renal protection.  She will consider meds and let me know next visit.  Could also consider [place].  Urine micro ordered.  ROI for DM eye from [facility].  ASCVD >25%. Declines statin. Declines CT coronary artery calcium scoring. Has capacity.  Declines vaccines  Raliegh Ip, DO [facility] [phone]

## 2023-06-14 NOTE — Patient Instructions (Addendum)
Consider Jardiance or Marcelline Deist for kidneys We talked about heart risk today.   More information below on heart scans since your cholesterol is so high.  Coronary Calcium Scan A coronary calcium scan is an imaging test used to look for deposits of plaque in the inner lining of the blood vessels of the heart (coronary arteries). Plaque is made up of calcium, protein, and fatty substances. These deposits of plaque can partly clog and narrow the coronary arteries without producing any symptoms or warning signs. This puts a person at risk for a heart attack. A coronary calcium scan is performed using a computed tomography (CT) scanner machine without using a dye (contrast). This test is recommended for people who are at moderate risk for heart disease. The test can find plaque deposits before symptoms develop. Tell a health care provider about: Any allergies you have. All medicines you are taking, including vitamins, herbs, eye drops, creams, and over-the-counter medicines. Any problems you or family members have had with anesthetic medicines. Any bleeding problems you have. Any surgeries you have had. Any medical conditions you have. Whether you are pregnant or may be pregnant. What are the risks? Generally, this is a safe procedure. However, problems may occur, including: Harm to a pregnant woman and her unborn baby. This test involves the use of radiation. Radiation exposure can be dangerous to a pregnant woman and her unborn baby. If you are pregnant or think you may be pregnant, you should not have this procedure done. A slight increase in the risk of cancer. This is because of the radiation involved in the test. The amount of radiation from one test is similar to the amount of radiation you are naturally exposed to over one year. What happens before the procedure? Ask your health care provider for any specific instructions on how to prepare for this procedure. You may be asked to avoid products  that contain caffeine, tobacco, or nicotine for 4 hours before the procedure. What happens during the procedure?  You will undress and remove any jewelry from your neck or chest. You may need to remove hearing aides and dentures. Women may need to remove their bras. You will put on a hospital gown. Sticky electrodes will be placed on your chest. The electrodes will be connected to an electrocardiogram (ECG) machine to record a tracing of the electrical activity of your heart. You will lie down on your back on a curved bed that is attached to the CT scanner. You may be given medicine to slow down your heart rate so that clear pictures can be created. You will be moved into the CT scanner, and the CT scanner will take pictures of your heart. During this time, you will be asked to lie still and hold your breath for 10-20 seconds at a time while each picture of your heart is being taken. The procedure may vary among health care providers and hospitals. What can I expect after the procedure? You can return to your normal activities. It is up to you to get the results of your procedure. Ask your health care provider, or the department that is doing the procedure, when your results will be ready. Summary A coronary calcium scan is an imaging test used to look for deposits of plaque in the inner lining of the blood vessels of the heart. Plaque is made up of calcium, protein, and fatty substances. A coronary calcium scan is performed using a CT scanner machine without contrast. Generally, this is a  safe procedure. Tell your health care provider if you are pregnant or may be pregnant. Ask your health care provider for any specific instructions on how to prepare for this procedure. You can return to your normal activities after the scan is done. This information is not intended to replace advice given to you by your health care provider. Make sure you discuss any questions you have with your health care  provider. Document Revised: 08/10/2021 Document Reviewed: 08/10/2021 Elsevier Patient Education  2024 [employer].

## 2023-06-15 DIAGNOSIS — M79672 Pain in left foot: Secondary | ICD-10-CM | POA: Diagnosis not present

## 2023-06-15 DIAGNOSIS — R269 Unspecified abnormalities of gait and mobility: Secondary | ICD-10-CM | POA: Diagnosis not present

## 2023-06-22 DIAGNOSIS — M79672 Pain in left foot: Secondary | ICD-10-CM | POA: Diagnosis not present

## 2023-06-29 DIAGNOSIS — M79672 Pain in left foot: Secondary | ICD-10-CM | POA: Diagnosis not present

## 2023-06-29 DIAGNOSIS — R269 Unspecified abnormalities of gait and mobility: Secondary | ICD-10-CM | POA: Diagnosis not present

## 2023-07-06 DIAGNOSIS — M7662 Achilles tendinitis, left leg: Secondary | ICD-10-CM | POA: Diagnosis not present

## 2023-09-10 ENCOUNTER — Ambulatory Visit (INDEPENDENT_AMBULATORY_CARE_PROVIDER_SITE_OTHER): Payer: Medicare Other

## 2023-09-10 VITALS — Ht 63.0 in | Wt 189.0 lb

## 2023-09-10 DIAGNOSIS — Z0000 Encounter for general adult medical examination without abnormal findings: Secondary | ICD-10-CM | POA: Diagnosis not present

## 2023-09-10 NOTE — Progress Notes (Signed)
Subjective:   Kristie Lewis is a 72 y.o. female who presents for Medicare Annual (Subsequent) preventive examination.  Visit Complete: Virtual I connected with  Ivory Broad on 09/10/23 by a audio enabled telemedicine application and verified that I am speaking with the correct person using two identifiers.  Patient Location: Home  Provider Location: Home Office  I discussed the limitations of evaluation and management by telemedicine. The patient expressed understanding and agreed to proceed.  Vital Signs: Because this visit was a virtual/telehealth visit, some criteria may be missing or patient reported. Any vitals not documented were not able to be obtained and vitals that have been documented are patient reported.  Cardiac Risk Factors include: advanced age (>31men, >52 women);diabetes mellitus;dyslipidemia     Objective:    Today's Vitals   09/10/23 1430  Weight: 189 lb (85.7 kg)  Height: 5\' 3"  (1.6 m)   Body mass index is 33.48 kg/m.     09/10/2023    2:37 PM 08/28/2022   12:05 PM 07/23/2021    3:00 PM 03/16/2017   10:05 AM 06/05/2016    9:27 AM  Advanced Directives  Does Patient Have a Medical Advance Directive? No Yes Yes Yes Yes  Type of Special educational needs teacher of [place];Living will Healthcare Power of [place];Living will    Copy of Healthcare Power of Attorney in Chart?  No - copy requested No - copy requested    Would patient like information on creating a medical advance directive? Yes (MAU/Ambulatory/Procedural Areas - Information given)        Current Medications (verified) Outpatient Encounter Medications as of 09/10/2023  Medication Sig   allopurinol (ZYLOPRIM) 100 MG tablet Take 1 tablet (100 mg total) by mouth daily.   aspirin EC 81 MG tablet Take 81 mg by mouth 2 (two) times daily.   atenolol (TENORMIN) 25 MG tablet Take 1 tablet (25 mg total) by mouth daily.   B Complex Vitamins (B-COMPLEX/B-12 PO) Take by mouth.   Blood Glucose  Monitoring Suppl DEVI Check BGs daily. E11.69. May substitute to any manufacturer covered by patient's insurance.   colchicine 0.6 MG tablet TAKE 2 TABLETS AT THE ONSET OF ATTACK THEN 1 TAB EVERY 2 HOURS AS NEEDED UP TO 6 TABLETS THEN TAKE TWICE DAILY UNTIL SYMPTOMS CLEAR   furosemide (LASIX) 20 MG tablet Take 1 tablet (20 mg total) by mouth daily.   gabapentin (NEURONTIN) 300 MG capsule Take 1 capsule (300 mg total) by mouth at bedtime as needed.   Glucose Blood (BLOOD GLUCOSE TEST STRIPS) STRP CHeck BGs daily. E11.69. May substitute to any manufacturer covered by patient's insurance.   KRILL OIL 1000 MG CAPS Take by mouth.   L-Lysine 1000 MG TABS Take by mouth.   Lancet Device MISC Check BGs daily, E11.69. May substitute to any manufacturer covered by patient's insurance.   Lancets Misc. MISC Check BGs daily E11.69. May substitute to any manufacturer covered by patient's insurance.   Misc Natural Products (OSTEO BI-FLEX ADV DOUBLE ST PO) Take 2 capsules by mouth daily.    potassium chloride SA (KLOR-CON M) 20 MEQ tablet Take 1 tablet (20 mEq total) by mouth daily as needed (lasix).   UNABLE TO FIND systane eye drops   vitamin C (ASCORBIC ACID) 500 MG tablet Take 500 mg by mouth daily.   VITAMIN D PO Take by mouth.   zinc gluconate 50 MG tablet Take 50 mg by mouth daily.   No facility-administered encounter medications on file  as of 09/10/2023.    Allergies (verified) Patient has no known allergies.   History: Past Medical History:  Diagnosis Date   Arthritis    Cataract    GERD (gastroesophageal reflux disease)    Gout    Hyperlipidemia    Hypertension    Microscopic colitis    Renal cancer (HCC) 2010   Past Surgical History:  Procedure Laterality Date   BREAST EXCISIONAL BIOPSY Right 1971   BREAST SURGERY     right breast bx   CESAREAN SECTION     3 c-sections   COLONOSCOPY     HERNIA REPAIR     umbilical    NEPHRECTOMY Right    RIGHT KNEE ARTHROSCOPY     TUBAL  LIGATION     UPPER GASTROINTESTINAL ENDOSCOPY     Family History  Problem Relation Age of Onset   Hyperlipidemia Mother    Stroke Mother    Diabetes Father    Heart disease Father    Hypertension Father    Stroke Father    Colon cancer Neg Hx    Esophageal cancer Neg Hx    Stomach cancer Neg Hx    Rectal cancer Neg Hx    Social History   Socioeconomic History   Marital status: Married    Spouse name: Guerrant   Number of children: 2   Years of education: Not on file   Highest education level: Not on file  Occupational History   Occupation: Retired    Comment: Charity fundraiser  Tobacco Use   Smoking status: Never   Smokeless tobacco: Never  Vaping Use   Vaping status: Never Used  Substance and Sexual Activity   Alcohol use: No   Drug use: No   Sexual activity: Not on file  Other Topics Concern   Not on file  Social History Narrative   Married x 25 years.    2 children   3 step-children   9 grandchildren   Social Drivers of Corporate investment banker Strain: Low Risk  (09/10/2023)   Overall Financial Resource Strain (CARDIA)    Difficulty of Paying Living Expenses: Not hard at all  Food Insecurity: No Food Insecurity (09/10/2023)   Hunger Vital Sign    Worried About Running Out of Food in the Last Year: Never true    Ran Out of Food in the Last Year: Never true  Transportation Needs: No Transportation Needs (09/10/2023)   PRAPARE - Administrator, Civil Service (Medical): No    Lack of Transportation (Non-Medical): No  Physical Activity: Insufficiently Active (09/10/2023)   Exercise Vital Sign    Days of Exercise per Week: 3 days    Minutes of Exercise per Session: 30 min  Stress: No Stress Concern Present (09/10/2023)   [employer] of Occupational Health - Occupational Stress Questionnaire    Feeling of Stress : Not at all  Social Connections: Moderately Isolated (09/10/2023)   Social Connection and Isolation Panel [NHANES]    Frequency of  Communication with Friends and Family: More than three times a week    Frequency of Social Gatherings with Friends and Family: Three times a week    Attends Religious Services: Never    Active Member of Clubs or Organizations: No    Attends Banker Meetings: Never    Marital Status: Married    Tobacco Counseling Counseling given: Not Answered   Clinical Intake:  Pre-visit preparation completed: Yes  Pain : No/denies pain  Diabetes: Yes CBG done?: No Did pt. bring in CBG monitor from home?: No  How often do you need to have someone help you when you read instructions, pamphlets, or other written materials from your doctor or pharmacy?: 1 - Never  Interpreter Needed?: No  Information entered by :: Kandis Fantasia LPN   Activities of Daily Living    09/10/2023    2:36 PM  In your present state of health, do you have any difficulty performing the following activities:  Hearing? 0  Vision? 0  Difficulty concentrating or making decisions? 0  Walking or climbing stairs? 0  Dressing or bathing? 0  Doing errands, shopping? 0  Preparing Food and eating ? N  Using the Toilet? N  In the past six months, have you accidently leaked urine? N  Do you have problems with loss of bowel control? N  Managing your Medications? N  Managing your Finances? N  Housekeeping or managing your Housekeeping? N    Patient Care Team: Raliegh Ip, DO as PCP - General (Family Medicine) Jonelle Sidle, MD as PCP - Cardiology (Cardiology)  Indicate any recent Medical Services you may have received from other than [facility] providers in the past year (date may be approximate).     Assessment:   This is a routine wellness examination for Tyann.  Hearing/Vision screen Hearing Screening - Comments:: Denies hearing difficulties   Vision Screening - Comments:: Wears rx glasses - up to date with routine eye exams with Dr. Sherryll Burger     Goals Addressed             This  Visit's Progress    Remain active and independent        Depression Screen    09/10/2023    2:35 PM 06/14/2023    1:31 PM 08/28/2022   12:04 PM 09/16/2021    1:54 PM 07/23/2021    2:56 PM 10/29/2020    9:14 AM 07/05/2020    2:33 PM  PHQ 2/9 Scores  PHQ - 2 Score 0 0 0 0 0 0 0  PHQ- 9 Score  0     0    Fall Risk    09/10/2023    2:36 PM 06/14/2023    1:25 PM 02/19/2023    8:59 AM 08/28/2022   12:02 PM 09/16/2021    1:54 PM  Fall Risk   Falls in the past year? 0 0 0 0 0  Number falls in past yr: 0 0 0 0   Injury with Fall? 0 0 0 0   Risk for fall due to : No Fall Risks No Fall Risks No Fall Risks No Fall Risks   Follow up Falls prevention discussed;Education provided;Falls evaluation completed Education provided Education provided Falls prevention discussed     MEDICARE RISK AT HOME: Medicare Risk at Home Any stairs in or around the home?: No If so, are there any without handrails?: No Home free of loose throw rugs in walkways, pet beds, electrical cords, etc?: Yes Adequate lighting in your home to reduce risk of falls?: Yes Life alert?: No Use of a cane, walker or w/c?: No Grab bars in the bathroom?: Yes Shower chair or bench in shower?: No Elevated toilet seat or a handicapped toilet?: Yes  TIMED UP AND GO:  Was the test performed?  No    Cognitive Function:        09/10/2023    2:37 PM 08/28/2022   12:06 PM 07/23/2021  3:16 PM 07/23/2021    3:03 PM  6CIT Screen  What Year? 0 points 0 points 0 points 0 points  What month? 0 points 0 points 0 points 0 points  What time? 0 points 0 points 0 points 0 points  Count back from 20 0 points 0 points 0 points 0 points  Months in reverse 0 points 0 points 0 points 0 points  Repeat phrase 0 points 0 points 2 points 2 points  Total Score 0 points 0 points 2 points 2 points    Immunizations Immunization History  Administered Date(s) Administered   Td 04/11/1999   Td (Adult),5 Lf Tetanus Toxid, Preservative Free  04/11/1999    TDAP status: Due, Education has been provided regarding the importance of this vaccine. Advised may receive this vaccine at local pharmacy or Health Dept. Aware to provide a copy of the vaccination record if obtained from local pharmacy or Health Dept. Verbalized acceptance and understanding.  Flu Vaccine status: Declined, Education has been provided regarding the importance of this vaccine but patient still declined. Advised may receive this vaccine at local pharmacy or Health Dept. Aware to provide a copy of the vaccination record if obtained from local pharmacy or Health Dept. Verbalized acceptance and understanding.  Pneumococcal vaccine status: Declined,  Education has been provided regarding the importance of this vaccine but patient still declined. Advised may receive this vaccine at local pharmacy or Health Dept. Aware to provide a copy of the vaccination record if obtained from local pharmacy or Health Dept. Verbalized acceptance and understanding.   Covid-19 vaccine status: Declined, Education has been provided regarding the importance of this vaccine but patient still declined. Advised may receive this vaccine at local pharmacy or Health Dept.or vaccine clinic. Aware to provide a copy of the vaccination record if obtained from local pharmacy or Health Dept. Verbalized acceptance and understanding.  Qualifies for Shingles Vaccine? Yes   Zostavax completed No   Shingrix Completed?: No.    Education has been provided regarding the importance of this vaccine. Patient has been advised to call insurance company to determine out of pocket expense if they have not yet received this vaccine. Advised may also receive vaccine at local pharmacy or Health Dept. Verbalized acceptance and understanding.  Screening Tests Health Maintenance  Topic Date Due   Pneumonia Vaccine 51+ Years old (1 of 2 - PCV) Never done   COVID-19 Vaccine (1 - 2024-25 season) Never done   Zoster Vaccines-  Shingrix (1 of 2) 09/13/2023 (Originally 12/22/2000)   INFLUENZA VACCINE  12/13/2023 (Originally 04/15/2023)   Diabetic kidney evaluation - Urine ACR  08/13/2027 (Originally 08/13/2023)   HEMOGLOBIN A1C  12/05/2023   FOOT EXAM  02/19/2024   OPHTHALMOLOGY EXAM  04/25/2024   Diabetic kidney evaluation - eGFR measurement  06/06/2024   Medicare Annual Wellness (AWV)  09/09/2024   MAMMOGRAM  10/12/2024   DEXA SCAN  10/12/2024   Colonoscopy  06/05/2026   Hepatitis C Screening  Completed   HPV VACCINES  Aged Out   DTaP/Tdap/Td  Discontinued    Health Maintenance  Health Maintenance Due  Topic Date Due   Pneumonia Vaccine 89+ Years old (1 of 2 - PCV) Never done   COVID-19 Vaccine (1 - 2024-25 season) Never done    Colorectal cancer screening: Type of screening: Colonoscopy. Completed 06/05/16. Repeat every 10 years  Mammogram status: Completed 10/12/22. Repeat every year  Bone Density status: Completed 10/12/22. Results reflect: Bone density results: OSTEOPENIA. Repeat every 2 years.  Lung Cancer Screening: (Low Dose CT Chest recommended if Age 29-80 years, 20 pack-year currently smoking OR have quit w/in 15years.) does not qualify.   Lung Cancer Screening Referral: n/a  Additional Screening:  Hepatitis C Screening: does qualify; Completed 08/25/19  Vision Screening: Recommended annual ophthalmology exams for early detection of glaucoma and other disorders of the eye. Is the patient up to date with their annual eye exam?  Yes  Who is the provider or what is the name of the office in which the patient attends annual eye exams? Dr. Georges Mouse  If pt is not established with a provider, would they like to be referred to a provider to establish care? No .   Dental Screening: Recommended annual dental exams for proper oral hygiene  Diabetic Foot Exam: Diabetic Foot Exam: Completed 02/19/23  Community Resource Referral / Chronic Care Management: CRR required this visit?  No   CCM  required this visit?  No     Plan:     I have personally reviewed and noted the following in the patient's chart:   Medical and social history Use of alcohol, tobacco or illicit drugs  Current medications and supplements including opioid prescriptions. Patient is not currently taking opioid prescriptions. Functional ability and status Nutritional status Physical activity Advanced directives List of other physicians Hospitalizations, surgeries, and ER visits in previous 12 months Vitals Screenings to include cognitive, depression, and falls Referrals and appointments  In addition, I have reviewed and discussed with patient certain preventive protocols, quality metrics, and best practice recommendations. A written personalized care plan for preventive services as well as general preventive health recommendations were provided to patient.     Kandis Fantasia [place], [place]   [id number]/06/[id number]   After Visit Summary: (MyChart) Due to this being a telephonic visit, the after visit summary with patients personalized plan was offered to patient via MyChart   Nurse Notes: No concerns at this time

## 2023-09-10 NOTE — Patient Instructions (Signed)
Ms. Geer , Thank you for taking time to come for your Medicare Wellness Visit. I appreciate your ongoing commitment to your health goals. Please review the following plan we discussed and let me know if I can assist you in the future.   Referrals/Orders/Follow-Ups/Clinician Recommendations: Aim for 30 minutes of exercise or brisk walking, 6-8 glasses of water, and 5 servings of fruits and vegetables each day.  This is a list of the screening recommended for you and due dates:  Health Maintenance  Topic Date Due   Pneumonia Vaccine (1 of 2 - PCV) Never done   COVID-19 Vaccine (1 - 2024-25 season) Never done   Zoster (Shingles) Vaccine (1 of 2) 09/13/2023*   Flu Shot  12/13/2023*   Yearly kidney health urinalysis for diabetes  08/13/2027*   Hemoglobin A1C  12/05/2023   Complete foot exam   02/19/2024   Eye exam for diabetics  04/25/2024   Yearly kidney function blood test for diabetes  06/06/2024   Medicare Annual Wellness Visit  09/09/2024   Mammogram  10/12/2024   DEXA scan (bone density measurement)  10/12/2024   Colon Cancer Screening  06/05/2026   Hepatitis C Screening  Completed   HPV Vaccine  Aged Out   DTaP/Tdap/Td vaccine  Discontinued  *Topic was postponed. The date shown is not the original due date.    Advanced directives: (ACP Link)Information on Advanced Care Planning can be found at [facility] [place] Advance Health Care Directives Advance Health Care Directives ([email or web address]   Next Medicare Annual Wellness Visit scheduled for next year: Yes

## 2023-10-01 DIAGNOSIS — D1801 Hemangioma of skin and subcutaneous tissue: Secondary | ICD-10-CM | POA: Diagnosis not present

## 2023-10-01 DIAGNOSIS — Z85828 Personal history of other malignant neoplasm of skin: Secondary | ICD-10-CM | POA: Diagnosis not present

## 2023-10-01 DIAGNOSIS — L570 Actinic keratosis: Secondary | ICD-10-CM | POA: Diagnosis not present

## 2023-10-01 DIAGNOSIS — L821 Other seborrheic keratosis: Secondary | ICD-10-CM | POA: Diagnosis not present

## 2023-10-01 DIAGNOSIS — L814 Other melanin hyperpigmentation: Secondary | ICD-10-CM | POA: Diagnosis not present

## 2023-10-06 ENCOUNTER — Other Ambulatory Visit: Payer: Self-pay | Admitting: Family Medicine

## 2023-10-06 DIAGNOSIS — Z1231 Encounter for screening mammogram for malignant neoplasm of breast: Secondary | ICD-10-CM

## 2023-10-19 ENCOUNTER — Ambulatory Visit
Admission: RE | Admit: 2023-10-19 | Discharge: 2023-10-19 | Disposition: A | Discharge status: Home / Self Care | Payer: Medicare Other | Source: Ambulatory Visit | Attending: Family Medicine | Admitting: Family Medicine

## 2023-10-19 DIAGNOSIS — Z1231 Encounter for screening mammogram for malignant neoplasm of breast: Secondary | ICD-10-CM | POA: Diagnosis not present

## 2023-10-22 ENCOUNTER — Encounter: Payer: Self-pay | Admitting: Family Medicine

## 2023-12-13 ENCOUNTER — Other Ambulatory Visit: Payer: Self-pay | Admitting: Family Medicine

## 2023-12-13 DIAGNOSIS — E1169 Type 2 diabetes mellitus with other specified complication: Secondary | ICD-10-CM

## 2023-12-13 MED ORDER — LANCETS MISC. MISC: 3 refills | Status: DC

## 2024-02-11 ENCOUNTER — Telehealth: Payer: Self-pay | Admitting: Family Medicine

## 2024-02-11 ENCOUNTER — Encounter: Payer: Self-pay | Admitting: Family Medicine

## 2024-02-11 DIAGNOSIS — E559 Vitamin D deficiency, unspecified: Secondary | ICD-10-CM

## 2024-02-11 DIAGNOSIS — N1831 Chronic kidney disease, stage 3a: Secondary | ICD-10-CM

## 2024-02-11 DIAGNOSIS — E1169 Type 2 diabetes mellitus with other specified complication: Secondary | ICD-10-CM

## 2024-02-11 DIAGNOSIS — E1122 Type 2 diabetes mellitus with diabetic chronic kidney disease: Secondary | ICD-10-CM

## 2024-02-11 DIAGNOSIS — E039 Hypothyroidism, unspecified: Secondary | ICD-10-CM

## 2024-02-11 DIAGNOSIS — Z0000 Encounter for general adult medical examination without abnormal findings: Secondary | ICD-10-CM

## 2024-02-11 NOTE — Telephone Encounter (Signed)
 Future labs ordered.

## 2024-02-11 NOTE — Telephone Encounter (Signed)
 Copied from CRM [id number]. Topic: Clinical - Request for Lab/Test Order >> Feb 11, 2024  8:32 AM Juluis Ok wrote: Reason for CRM: Patient requesting to have labs completed before appointment for physical on June 9. Lab appointment scheduled for 06/05.

## 2024-02-17 ENCOUNTER — Other Ambulatory Visit

## 2024-02-17 DIAGNOSIS — N1831 Chronic kidney disease, stage 3a: Secondary | ICD-10-CM | POA: Diagnosis not present

## 2024-02-17 DIAGNOSIS — E1169 Type 2 diabetes mellitus with other specified complication: Secondary | ICD-10-CM

## 2024-02-17 DIAGNOSIS — E039 Hypothyroidism, unspecified: Secondary | ICD-10-CM | POA: Diagnosis not present

## 2024-02-17 DIAGNOSIS — Z0000 Encounter for general adult medical examination without abnormal findings: Secondary | ICD-10-CM

## 2024-02-17 DIAGNOSIS — E785 Hyperlipidemia, unspecified: Secondary | ICD-10-CM | POA: Diagnosis not present

## 2024-02-17 DIAGNOSIS — N183 Chronic kidney disease, stage 3 unspecified: Secondary | ICD-10-CM

## 2024-02-17 LAB — BAYER DCA HB A1C WAIVED: HB A1C (BAYER DCA - WAIVED): 5.8 % — ABNORMAL HIGH (ref 4.8–5.6)

## 2024-02-18 ENCOUNTER — Encounter: Payer: Self-pay | Admitting: Family Medicine

## 2024-02-18 LAB — MICROALBUMIN / CREATININE URINE RATIO
Creatinine, Urine: 72.2 mg/dL
Microalb/Creat Ratio: <4 mg/g{creat} (ref 0–29)
Microalbumin, Urine: <3 ug/mL

## 2024-02-21 ENCOUNTER — Encounter: Payer: Self-pay | Admitting: Family Medicine

## 2024-02-21 ENCOUNTER — Ambulatory Visit: Payer: Self-pay | Admitting: Family Medicine

## 2024-02-21 ENCOUNTER — Ambulatory Visit: Payer: Medicare Other | Admitting: Family Medicine

## 2024-02-21 VITALS — BP 106/60 | HR 55 | Temp 98.8°F | Ht 63.0 in | Wt 190.8 lb

## 2024-02-21 DIAGNOSIS — M1A39X0 Chronic gout due to renal impairment, multiple sites, without tophus (tophi): Secondary | ICD-10-CM | POA: Diagnosis not present

## 2024-02-21 DIAGNOSIS — E1169 Type 2 diabetes mellitus with other specified complication: Secondary | ICD-10-CM | POA: Diagnosis not present

## 2024-02-21 DIAGNOSIS — E559 Vitamin D deficiency, unspecified: Secondary | ICD-10-CM | POA: Diagnosis not present

## 2024-02-21 DIAGNOSIS — E039 Hypothyroidism, unspecified: Secondary | ICD-10-CM | POA: Diagnosis not present

## 2024-02-21 DIAGNOSIS — E785 Hyperlipidemia, unspecified: Secondary | ICD-10-CM | POA: Diagnosis not present

## 2024-02-21 DIAGNOSIS — Z0000 Encounter for general adult medical examination without abnormal findings: Secondary | ICD-10-CM

## 2024-02-21 DIAGNOSIS — N183 Chronic kidney disease, stage 3 unspecified: Secondary | ICD-10-CM

## 2024-02-21 DIAGNOSIS — E1122 Type 2 diabetes mellitus with diabetic chronic kidney disease: Secondary | ICD-10-CM

## 2024-02-21 DIAGNOSIS — I129 Hypertensive chronic kidney disease with stage 1 through stage 4 chronic kidney disease, or unspecified chronic kidney disease: Secondary | ICD-10-CM | POA: Diagnosis not present

## 2024-02-21 DIAGNOSIS — Z2821 Immunization not carried out because of patient refusal: Secondary | ICD-10-CM

## 2024-02-21 DIAGNOSIS — Z5320 Procedure and treatment not carried out because of patient's decision for unspecified reasons: Secondary | ICD-10-CM | POA: Diagnosis not present

## 2024-02-21 DIAGNOSIS — Z0001 Encounter for general adult medical examination with abnormal findings: Secondary | ICD-10-CM

## 2024-02-21 MED ORDER — FUROSEMIDE 20 MG PO TABS
20.0000 mg | ORAL_TABLET | Freq: Every day | ORAL | 3 refills | Status: AC
Start: 2024-02-21 — End: ?

## 2024-02-21 MED ORDER — LANCET DEVICE MISC: 0 refills | Status: DC

## 2024-02-21 MED ORDER — COLCHICINE 0.6 MG PO TABS: ORAL_TABLET | ORAL | 99 refills | Status: AC

## 2024-02-21 MED ORDER — ALLOPURINOL 100 MG PO TABS: 100.0000 mg | ORAL_TABLET | Freq: Every day | ORAL | 3 refills | Status: AC

## 2024-02-21 MED ORDER — ATENOLOL 25 MG PO TABS: 25.0000 mg | ORAL_TABLET | Freq: Every day | ORAL | 3 refills | Status: AC

## 2024-02-21 NOTE — Progress Notes (Signed)
 Kristie Lewis is a 73 y.o. female who is accompanied today's visit by her husband.  She presents to office today for annual physical exam examination.    Concerns today include: 1. Type 2 Diabetes with hypertension, hyperlipidemia w/ CKD3a:  She reports compliance with low-carb diet but is not exercising regularly.  She tries to stay active by gardening 2-3 times per week.  She does have a bike but does not ride it regularly.  Last eye exam: UTD Last foot exam: needs Last A1c:  Lab Results  Component Value Date   HGBA1C 5.8 (H) 02/17/2024   Nephropathy screen indicated?: UTD Last flu, zoster and/or pneumovax:  Immunization History  Administered Date(s) Administered   Td 04/11/1999   Td (Adult),5 Lf Tetanus Toxid, Preservative Free 04/11/1999    ROS: No chest pain, shortness of breath, sensory changes  2.  Gout She reports that she had a recent mild start to gout flare.  She is compliant with allopurinol  but had to use her colchicine  x 1.  Wanted make sure she gets a refill on that.  Does not recall why it started nor what she ate prior to onset   Occupation: Retired, Marital status: Married, Substance use: None Health Maintenance Due  Topic Date Due   Pneumonia Vaccine 23+ Years old (1 of 2 - PCV) Never done   Zoster Vaccines- Shingrix (1 of 2) Never done   DTaP/Tdap/Td (3 - Tdap) 04/10/2009   COVID-19 Vaccine (1 - 2024-25 season) Never done   FOOT EXAM  02/19/2024   Refills needed today: All  Immunization History  Administered Date(s) Administered   Td 04/11/1999   Td (Adult),5 Lf Tetanus Toxid, Preservative Free 04/11/1999   Past Medical History:  Diagnosis Date   Arthritis    Cataract    GERD (gastroesophageal reflux disease)    Gout    Hyperlipidemia    Hypertension    Microscopic colitis    Renal cancer (HCC) 2010   Social History   Socioeconomic History   Marital status: Married    Spouse name: Guerrant   Number of children: 2   Years of  education: Not on file   Highest education level: Not on file  Occupational History   Occupation: Retired    Comment: Charity fundraiser  Tobacco Use   Smoking status: Never   Smokeless tobacco: Never  Vaping Use   Vaping status: Never Used  Substance and Sexual Activity   Alcohol use: No   Drug use: No   Sexual activity: Not on file  Other Topics Concern   Not on file  Social History Narrative   Married x 25 years.    2 children   3 step-children   9 grandchildren   Social Drivers of Corporate investment banker Strain: Low Risk  (09/10/2023)   Overall Financial Resource Strain (CARDIA)    Difficulty of Paying Living Expenses: Not hard at all  Food Insecurity: No Food Insecurity (09/10/2023)   Hunger Vital Sign    Worried About Running Out of Food in the Last Year: Never true    Ran Out of Food in the Last Year: Never true  Transportation Needs: No Transportation Needs (09/10/2023)   PRAPARE - Administrator, Civil Service (Medical): No    Lack of Transportation (Non-Medical): No  Physical Activity: Insufficiently Active (09/10/2023)   Exercise Vital Sign    Days of Exercise per Week: 3 days    Minutes of Exercise per Session: 30  min  Stress: No Stress Concern Present (09/10/2023)   [employer] of Occupational Health - Occupational Stress Questionnaire    Feeling of Stress : Not at all  Social Connections: Moderately Isolated (09/10/2023)   Social Connection and Isolation Panel [NHANES]    Frequency of Communication with Friends and Family: More than three times a week    Frequency of Social Gatherings with Friends and Family: Three times a week    Attends Religious Services: Never    Active Member of Clubs or Organizations: No    Attends Banker Meetings: Never    Marital Status: Married  Catering manager Violence: Not At Risk (09/10/2023)   Humiliation, Afraid, Rape, and Kick questionnaire    Fear of Current or Ex-Partner: No    Emotionally Abused:  No    Physically Abused: No    Sexually Abused: No   Past Surgical History:  Procedure Laterality Date   BREAST EXCISIONAL BIOPSY Right 1971   BREAST SURGERY     right breast bx   CESAREAN SECTION     3 c-sections   COLONOSCOPY     HERNIA REPAIR     umbilical    NEPHRECTOMY Right    RIGHT KNEE ARTHROSCOPY     TUBAL LIGATION     UPPER GASTROINTESTINAL ENDOSCOPY     Family History  Problem Relation Age of Onset   Hyperlipidemia Mother    Stroke Mother    Diabetes Father    Heart disease Father    Hypertension Father    Stroke Father    Colon cancer Neg Hx    Esophageal cancer Neg Hx    Stomach cancer Neg Hx    Rectal cancer Neg Hx     Current Outpatient Medications:    allopurinol  (ZYLOPRIM ) 100 MG tablet, Take 1 tablet (100 mg total) by mouth daily., Disp: 100 tablet, Rfl: 3   aspirin EC 81 MG tablet, Take 81 mg by mouth 2 (two) times daily., Disp: , Rfl:    atenolol  (TENORMIN ) 25 MG tablet, Take 1 tablet (25 mg total) by mouth daily., Disp: 100 tablet, Rfl: 3   B Complex Vitamins (B-COMPLEX/B-12 PO), Take by mouth., Disp: , Rfl:    Blood Glucose Monitoring Suppl DEVI, Check BGs daily. E11.69. May substitute to any manufacturer covered by patient's insurance., Disp: 1 each, Rfl: 0   colchicine  0.6 MG tablet, TAKE 2 TABLETS AT THE ONSET OF ATTACK THEN 1 TAB EVERY 2 HOURS AS NEEDED UP TO 6 TABLETS THEN TAKE TWICE DAILY UNTIL SYMPTOMS CLEAR, Disp: 30 tablet, Rfl: PRN   furosemide  (LASIX ) 20 MG tablet, Take 1 tablet (20 mg total) by mouth daily., Disp: 100 tablet, Rfl: 3   gabapentin  (NEURONTIN ) 300 MG capsule, Take 1 capsule (300 mg total) by mouth at bedtime as needed., Disp: 90 capsule, Rfl: 3   glucose blood (ONETOUCH VERIO) test strip, Check BGs daily, E11.69, Disp: 100 each, Rfl: 3   KRILL OIL 1000 MG CAPS, Take by mouth., Disp: , Rfl:    L-Lysine 1000 MG TABS, Take by mouth., Disp: , Rfl:    Lancet Device MISC, Check BGs daily, E11.69. May substitute to any manufacturer  covered by patient's insurance., Disp: 1 each, Rfl: 0   Lancets Misc. MISC, Check BGs daily E11.69. May substitute to any manufacturer covered by patient's insurance., Disp: 100 each, Rfl: 3   Misc Natural Products (OSTEO BI-FLEX ADV DOUBLE ST PO), Take 2 capsules by mouth daily. , Disp: , Rfl:  potassium chloride  SA (KLOR-CON  M) 20 MEQ tablet, Take 1 tablet (20 mEq total) by mouth daily as needed (lasix )., Disp: 100 tablet, Rfl: 3   UNABLE TO FIND, systane eye drops, Disp: , Rfl:    vitamin C (ASCORBIC ACID) 500 MG tablet, Take 500 mg by mouth daily., Disp: , Rfl:    VITAMIN D  PO, Take by mouth., Disp: , Rfl:    zinc gluconate 50 MG tablet, Take 50 mg by mouth daily., Disp: , Rfl:   No Known Allergies   ROS: Review of Systems A comprehensive review of systems was negative except for: Musculoskeletal: positive for bilateral heel pain    Physical exam BP 106/60   Pulse (!) 55   Temp 98.8 F (37.1 C)   Ht 5\' 3"  (1.6 m)   Wt 190 lb 12.8 oz (86.5 kg)   SpO2 99%   BMI 33.80 kg/m  General appearance: alert, cooperative, appears stated age, and moderately obese Head: Normocephalic, without obvious abnormality, atraumatic Eyes: negative findings: lids and lashes normal, conjunctivae and sclerae normal, corneas clear, and pupils equal, round, reactive to light and accomodation Ears: normal TM's and external ear canals both ears Nose: Nares normal. Septum midline. Mucosa normal. No drainage or sinus tenderness. Throat: Oropharynx without lesions, erythema.  She has a symmetric rise of palate where the right palate does not rise Neck: no adenopathy, no carotid bruit, supple, symmetrical, trachea midline, and thyroid  not enlarged, symmetric, no tenderness/mass/nodules Back: symmetric, no curvature. ROM normal. No CVA tenderness. Lungs: clear to auscultation bilaterally Heart: Bradycardic rate and regular rhythm, S1, S2 normal, no murmur, click, rub or gallop Abdomen: Obese, soft.  No  hepatosplenomegaly.  She has generalized tenderness to palpation in the left upper quadrant and right upper quadrants.  No rebound or guarding. Extremities: extremities normal, atraumatic, no cyanosis or edema and varicose veins noted Pulses: 2+ and symmetric Skin: Postinflammatory hyperpigmentation along the right lower extremity.  She has has varicose veins as above Lymph nodes: Cervical, supraclavicular, and axillary nodes normal. Neurologic: Grossly normal except for asymmetric rise of palate as above     02/21/2024    8:13 AM 09/10/2023    2:35 PM 06/14/2023    1:31 PM  Depression screen PHQ 2/9  Decreased Interest 0 0 0  Down, Depressed, Hopeless 0 0 0  PHQ - 2 Score 0 0 0  Altered sleeping 0  0  Tired, decreased energy 1  0  Change in appetite 0  0  Feeling bad or failure about yourself  0  0  Trouble concentrating 0  0  Moving slowly or fidgety/restless 0  0  Suicidal thoughts 0  0  PHQ-9 Score 1  0  Difficult doing work/chores Somewhat difficult  Not difficult at all      02/21/2024    8:13 AM 06/14/2023    1:31 PM 02/19/2023    8:59 AM 09/16/2021    1:54 PM  GAD 7 : Generalized Anxiety Score  Nervous, Anxious, on Edge 0 0 0 0  Control/stop worrying 0 0 0 0  Worry too much - different things 0 0 0 0  Trouble relaxing 0 0 0 0  Restless 0 0 0 0  Easily annoyed or irritable 0 0 0 0  Afraid - awful might happen 0 0 0 0  Total GAD 7 Score 0 0 0 0  Anxiety Difficulty Not difficult at all Not difficult at all Not difficult at all Not difficult at all  Recent Results (from the past 2160 hours)  Bayer DCA Hb A1c Waived     Status: Abnormal   Collection Time: 02/17/24  8:44 AM  Result Value Ref Range   HB A1C (BAYER DCA - WAIVED) 5.8 (H) 4.8 - 5.6 %    Comment:          Prediabetes: 5.7 - 6.4          Diabetes: >6.4          Glycemic control for adults with diabetes: <7.0   TSH     Status: None (Preliminary result)   Collection Time: 02/17/24  8:46 AM  Result Value Ref  Range   TSH WILL FOLLOW   Lipid panel     Status: None (Preliminary result)   Collection Time: 02/17/24  8:46 AM  Result Value Ref Range   Cholesterol, Total WILL FOLLOW    Triglycerides WILL FOLLOW    HDL WILL FOLLOW    VLDL Cholesterol Cal WILL FOLLOW    LDL Chol Calc (NIH) WILL FOLLOW    LDL CALC COMMENT: WILL FOLLOW    Chol/HDL Ratio WILL FOLLOW   VITAMIN D  25 Hydroxy (Vit-D Deficiency, Fractures)     Status: None (Preliminary result)   Collection Time: 02/17/24  8:46 AM  Result Value Ref Range   Vit D, 25-Hydroxy WILL FOLLOW   T4, Free     Status: None (Preliminary result)   Collection Time: 02/17/24  8:46 AM  Result Value Ref Range   Free T4 WILL FOLLOW   CMP14+EGFR     Status: None (Preliminary result)   Collection Time: 02/17/24  8:46 AM  Result Value Ref Range   Glucose WILL FOLLOW    BUN WILL FOLLOW    Creatinine, Ser WILL FOLLOW    eGFR WILL FOLLOW    BUN/Creatinine Ratio WILL FOLLOW    Sodium WILL FOLLOW    Potassium WILL FOLLOW    Chloride WILL FOLLOW    CO2 WILL FOLLOW    Calcium  WILL FOLLOW    Total Protein WILL FOLLOW    Albumin WILL FOLLOW    Globulin, Total WILL FOLLOW    Bilirubin Total WILL FOLLOW    Alkaline Phosphatase WILL FOLLOW    AST WILL FOLLOW    ALT WILL FOLLOW   CBC with Differential/Platelet     Status: Abnormal   Collection Time: 02/17/24  8:46 AM  Result Value Ref Range   WBC 4.9 3.4 - 10.8 x10E3/uL   RBC 4.21 3.77 - 5.28 x10E6/uL   Hemoglobin 13.2 11.1 - 15.9 g/dL   Hematocrit 16.1 09.6 - 46.6 %   MCV 98 (H) 79 - 97 fL   MCH 31.4 26.6 - 33.0 pg   MCHC 32.0 31.5 - 35.7 g/dL   RDW 04.5 40.9 - 81.1 %   Platelets 210 150 - 450 x10E3/uL   Neutrophils 52 Not Estab. %   Lymphs 35 Not Estab. %   Monocytes 8 Not Estab. %   Eos 4 Not Estab. %   Basos 1 Not Estab. %   Neutrophils Absolute 2.5 1.4 - 7.0 x10E3/uL   Lymphocytes Absolute 1.7 0.7 - 3.1 x10E3/uL   Monocytes Absolute 0.4 0.1 - 0.9 x10E3/uL   EOS (ABSOLUTE) 0.2 0.0 - 0.4  x10E3/uL   Basophils Absolute 0.1 0.0 - 0.2 x10E3/uL   Immature Granulocytes 0 Not Estab. %   Immature Grans (Abs) 0.0 0.0 - 0.1 x10E3/uL  Microalbumin / creatinine urine ratio  Status: None   Collection Time: 02/17/24 10:18 AM  Result Value Ref Range   Creatinine, Urine 72.2 Not Estab. mg/dL   Microalbumin, Urine <1.6 Not Estab. ug/mL   Microalb/Creat Ratio <4 0 - 29 mg/g creat    Comment:                        Normal:                0 -  29                        Moderately increased: 30 - 300                        Severely increased:       >300      Assessment/ Plan: Abdul Abe here for annual physical exam.   Annual physical exam  No vaccination-pt refuse  Type 2 diabetes mellitus with other specified complication, without long-term current use of insulin (HCC) - Plan: Lancet Device MISC  Hypertension associated with stage 3 chronic kidney disease due to type 2 diabetes mellitus (HCC) - Plan: atenolol  (TENORMIN ) 25 MG tablet, furosemide  (LASIX ) 20 MG tablet, colchicine  0.6 MG tablet  Hyperlipidemia associated with type 2 diabetes mellitus (HCC)  Refusal of statin medication by patient  Chronic gout due to renal impairment of multiple sites without tophus - Plan: allopurinol  (ZYLOPRIM ) 100 MG tablet, colchicine  0.6 MG tablet  Vitamin D  deficiency  Acquired hypothyroidism  She declines vaccinations.  For some reason over half of her lab results have not resulted.  Her A1c is controlled.  Continue diet modification to reduce blood sugar and advised to increase physical activity  Medications have been renewed.  Blood pressure well-controlled.  Cholesterol pending.  She declines statin regardless  Allopurinol  and colchicine  renewed for her  Thyroid  and vitamin D  levels are still pending  Counseled on healthy lifestyle choices, including diet (rich in fruits, vegetables and lean meats and low in salt and simple carbohydrates) and exercise (at least 30 minutes  of moderate physical activity daily).  Patient to follow up 48m for DM  Melisse Caetano M. Bonnell Butcher, DO

## 2024-02-21 NOTE — Patient Instructions (Signed)
 Low-Purine Eating Plan A low-purine eating plan involves making food choices to limit your purine intake. Purine is a kind of uric acid. Too much uric acid in your blood can cause certain conditions, such as gout and kidney stones. Eating a low-purine diet may help control these conditions. What are tips for following this plan? Shopping Avoid buying products that contain high-fructose corn syrup. Check for this on food labels. It is commonly found in many processed foods and soft drinks. Be sure to check for it in baked goods such as cookies, canned fruits, and cereals and cereal bars. Avoid buying veal, chicken breast with skin, lamb, and organ meats such as liver. These types of meats tend to have the highest purine content. Choose dairy products. These may lower uric acid levels. Avoid certain types of fish. Not all fish and seafood have high purine content. Examples with high purine content include anchovies, trout, tuna, sardines, and salmon. Avoid buying beverages that contain alcohol, particularly beer and hard liquor. Alcohol can affect the way your body gets rid of uric acid. Meal planning  Learn which foods do or do not affect you. If you find out that a food tends to cause your gout symptoms to flare up, avoid eating that food. You can enjoy foods that do not cause problems. If you have any questions about a food item, talk with your dietitian or health care provider. Reduce the overall amount of meat in your diet. When you do eat meat, choose ones with lower purine content. Include plenty of fruits and vegetables. Although some vegetables may have a high purine content--such as asparagus, mushrooms, spinach, or cauliflower--it has been shown that these do not contribute to uric acid blood levels as much. Consume at least 1 dairy serving a day. This has been shown to decrease uric acid levels. General information If you drink alcohol: Limit how much you have to: 0-1 drink a day for  women who are not pregnant. 0-2 drinks a day for men. Know how much alcohol is in a drink. In the [place] one drink equals one 12 oz bottle of beer (355 mL), one 5 oz glass of wine (148 mL), or one 1 oz glass of hard liquor (44 mL). Drink plenty of water . Try to drink enough to keep your urine pale yellow. Fluids can help remove uric acid from your body. Work with your health care provider and dietitian to develop a plan to achieve or maintain a healthy weight. Losing weight may help reduce uric acid in your blood. What foods are recommended? The following are some types of foods that are good choices when limiting purine intake: Fresh or frozen fruits and vegetables. Whole grains, breads, cereals, and pasta. Rice. Beans, peas, legumes. Nuts and seeds. Dairy products. Fats and oils. The items listed above may not be a complete list. Talk with a dietitian about what dietary choices are best for you. What foods are not recommended? Limit your intake of foods high in purines, including: Beer and other alcohol. Meat-based gravy or sauce. Canned or fresh fish, such as: Anchovies, sardines, herring, salmon, and tuna. Mussels and scallops. Codfish, trout, and haddock. Bacon, veal, chicken breast with skin, and lamb. Organ meats, such as: Liver or kidney. Tripe. Sweetbreads (thymus gland or pancreas). Wild Education officer, environmental. Yeast or yeast extract supplements. Drinks sweetened with high-fructose corn syrup, such as soda. Processed foods made with high-fructose corn syrup. The items listed above may not be a complete list of foods  and beverages you should limit. Contact a dietitian for more information. Summary Eating a low-purine diet may help control conditions caused by too much uric acid in the body, such as gout or kidney stones. Choose low-purine foods, limit alcohol, and limit high-fructose corn syrup. You will learn over time which foods do or do not affect you. If you find out that a  food tends to cause your gout symptoms to flare up, avoid eating that food. This information is not intended to replace advice given to you by your health care provider. Make sure you discuss any questions you have with your health care provider. Document Revised: 08/14/2021 Document Reviewed: 08/14/2021 Elsevier Patient Education  2025 [employer].

## 2024-02-22 LAB — CBC WITH DIFFERENTIAL/PLATELET
Basophils Absolute: 0.1 10*3/uL (ref 0.0–0.2)
Basos: 1 %
EOS (ABSOLUTE): 0.2 10*3/uL (ref 0.0–0.4)
Eos: 4 %
Hematocrit: 41.2 % (ref 34.0–46.6)
Hemoglobin: 13.2 g/dL (ref 11.1–15.9)
Immature Grans (Abs): 0 10*3/uL (ref 0.0–0.1)
Immature Granulocytes: 0 %
Lymphocytes Absolute: 1.7 10*3/uL (ref 0.7–3.1)
Lymphs: 35 %
MCH: 31.4 pg (ref 26.6–33.0)
MCHC: 32 g/dL (ref 31.5–35.7)
MCV: 98 fL — ABNORMAL HIGH (ref 79–97)
Monocytes Absolute: 0.4 10*3/uL (ref 0.1–0.9)
Monocytes: 8 %
Neutrophils Absolute: 2.5 10*3/uL (ref 1.4–7.0)
Neutrophils: 52 %
Platelets: 210 10*3/uL (ref 150–450)
RBC: 4.21 x10E6/uL (ref 3.77–5.28)
RDW: 13.4 % (ref 11.7–15.4)
WBC: 4.9 10*3/uL (ref 3.4–10.8)

## 2024-02-22 LAB — CMP14+EGFR
ALT: 23 IU/L (ref 0–32)
AST: 22 IU/L (ref 0–40)
Albumin: 4.6 g/dL (ref 3.8–4.8)
Alkaline Phosphatase: 101 IU/L (ref 44–121)
BUN/Creatinine Ratio: 14 (ref 12–28)
BUN: 17 mg/dL (ref 8–27)
Bilirubin Total: 0.6 mg/dL (ref 0.0–1.2)
CO2: 19 mmol/L — ABNORMAL LOW (ref 20–29)
Calcium: 9.8 mg/dL (ref 8.7–10.3)
Chloride: 103 mmol/L (ref 96–106)
Creatinine, Ser: 1.19 mg/dL — ABNORMAL HIGH (ref 0.57–1.00)
Globulin, Total: 2.6 g/dL (ref 1.5–4.5)
Glucose: 135 mg/dL — ABNORMAL HIGH (ref 70–99)
Potassium: 4.3 mmol/L (ref 3.5–5.2)
Sodium: 139 mmol/L (ref 134–144)
Total Protein: 7.2 g/dL (ref 6.0–8.5)
eGFR: 48 mL/min/{1.73_m2} — ABNORMAL LOW (ref >59)

## 2024-02-22 LAB — LIPID PANEL
Chol/HDL Ratio: 8.6 ratio — ABNORMAL HIGH (ref 0.0–4.4)
Cholesterol, Total: 311 mg/dL — ABNORMAL HIGH (ref 100–199)
HDL: 36 mg/dL — ABNORMAL LOW (ref >39)
LDL Chol Calc (NIH): 224 mg/dL — ABNORMAL HIGH (ref 0–99)
Triglycerides: 249 mg/dL — ABNORMAL HIGH (ref 0–149)
VLDL Cholesterol Cal: 51 mg/dL — ABNORMAL HIGH (ref 5–40)

## 2024-02-22 LAB — TSH: TSH: 4.76 u[IU]/mL — ABNORMAL HIGH (ref 0.450–4.500)

## 2024-02-22 LAB — VITAMIN D 25 HYDROXY (VIT D DEFICIENCY, FRACTURES): Vit D, 25-Hydroxy: 38.1 ng/mL (ref 30.0–100.0)

## 2024-02-22 LAB — T4, FREE: Free T4: 0.95 ng/dL (ref 0.82–1.77)

## 2024-03-20 ENCOUNTER — Encounter: Payer: Self-pay | Admitting: Family Medicine

## 2024-03-20 DIAGNOSIS — E1169 Type 2 diabetes mellitus with other specified complication: Secondary | ICD-10-CM

## 2024-03-20 MED ORDER — BLOOD GLUCOSE TEST VI STRP: ORAL_STRIP | 3 refills | Status: AC

## 2024-03-20 MED ORDER — BLOOD GLUCOSE MONITORING SUPPL DEVI: 0 refills | Status: DC

## 2024-03-20 MED ORDER — LANCETS MISC. MISC
4 refills | Status: AC
Start: 2024-03-20 — End: ?

## 2024-03-20 MED ORDER — LANCET DEVICE MISC
0 refills | Status: AC
Start: 2024-03-20 — End: ?

## 2024-03-30 ENCOUNTER — Emergency Department (HOSPITAL_COMMUNITY)
Admission: EM | Admit: 2024-03-30 | Discharge: 2024-03-30 | Disposition: A | Discharge status: Home / Self Care | Attending: Emergency Medicine | Admitting: Emergency Medicine

## 2024-03-30 ENCOUNTER — Encounter (HOSPITAL_COMMUNITY): Payer: Self-pay | Admitting: Emergency Medicine

## 2024-03-30 ENCOUNTER — Other Ambulatory Visit: Payer: Self-pay

## 2024-03-30 DIAGNOSIS — Z7982 Long term (current) use of aspirin: Secondary | ICD-10-CM | POA: Insufficient documentation

## 2024-03-30 DIAGNOSIS — E119 Type 2 diabetes mellitus without complications: Secondary | ICD-10-CM | POA: Diagnosis not present

## 2024-03-30 DIAGNOSIS — M542 Cervicalgia: Secondary | ICD-10-CM | POA: Diagnosis not present

## 2024-03-30 MED ORDER — HYDROCODONE-ACETAMINOPHEN 5-325 MG PO TABS
2.0000 | ORAL_TABLET | Freq: Once | ORAL | Status: DC
  Filled 2024-03-30: qty 2

## 2024-03-30 MED ORDER — PREDNISONE 10 MG PO TABS
10.0000 mg | ORAL_TABLET | Freq: Once | ORAL | Status: AC
  Administered 2024-03-30: 10 mg via ORAL
  Filled 2024-03-30: qty 1

## 2024-03-30 MED ORDER — HYDROCODONE-ACETAMINOPHEN 5-325 MG PO TABS: 1.0000 | ORAL_TABLET | Freq: Four times a day (QID) | ORAL | 0 refills | Status: DC | PRN

## 2024-03-30 MED ORDER — PREDNISONE 10 MG PO TABS: 10.0000 mg | ORAL_TABLET | Freq: Two times a day (BID) | ORAL | 0 refills | Status: DC

## 2024-03-30 NOTE — Discharge Instructions (Signed)
 Begin taking prednisone  as prescribed.  Begin taking hydrocodone  as prescribed as needed for pain.  Follow-up with primary doctor if symptoms are not improving in the next week to discuss advanced imaging studies or physical therapy.

## 2024-03-30 NOTE — ED Triage Notes (Signed)
 Pt with c/o L sided neck/shoulder pain for last couple of months that has progressively gotten worse.

## 2024-03-30 NOTE — ED Provider Notes (Signed)
 [place] EMERGENCY DEPARTMENT AT [facility] Provider Note   CSN: [id number] Arrival date & time: 03/30/24  [id number]     Patient presents with: Neck Pain   Kristie Lewis is a 73 y.o. female.   Patient is a 73 year old female with past medical history of hyperlipidemia, GERD, gout, diet-controlled diabetes, solitary kidney.  Patient presenting today for evaluation of neck pain.  She reports having this pain for the past several months that is progressively gotten worse.  She describes discomfort to the left side of her neck/trapezius area that is now coming into the side of her head.  She denies any weakness or numbness of the arm.  No headache.  There are no alleviating factors.       Prior to Admission medications   Medication Sig Start Date End Date Taking? Authorizing Provider  allopurinol  (ZYLOPRIM ) 100 MG tablet Take 1 tablet (100 mg total) by mouth daily. 02/21/24   Jolinda Norene HERO, DO  aspirin EC 81 MG tablet Take 81 mg by mouth 2 (two) times daily.    [provider]  atenolol  (TENORMIN ) 25 MG tablet Take 1 tablet (25 mg total) by mouth daily. 02/21/24   Jolinda Norene HERO, DO  B Complex Vitamins (B-COMPLEX/B-12 PO) Take by mouth.    [provider]  Blood Glucose Monitoring Suppl DEVI Check BGs daily. Wants accucheck E11.69. May substitute to any manufacturer covered by patient's insurance. 03/20/24   Gottschalk, Norene M, DO  colchicine  0.6 MG tablet TAKE 2 TABLETS AT THE ONSET OF ATTACK THEN 1 TAB EVERY 2 HOURS AS NEEDED UP TO 6 TABLETS THEN TAKE TWICE DAILY UNTIL SYMPTOMS CLEAR 02/21/24   Jolinda Norene M, DO  furosemide  (LASIX ) 20 MG tablet Take 1 tablet (20 mg total) by mouth daily. 02/21/24   Jolinda Norene HERO, DO  gabapentin  (NEURONTIN ) 300 MG capsule Take 1 capsule (300 mg total) by mouth at bedtime as needed. 02/19/23   Jolinda Norene M, DO  Glucose Blood (BLOOD GLUCOSE TEST STRIPS) STRP Check BGs daily. Wants accucheck May substitute to any  manufacturer covered by patient's insurance. 03/20/24   Gottschalk, Ashly M, DO  KRILL OIL 1000 MG CAPS Take by mouth.    [provider]  L-Lysine 1000 MG TABS Take by mouth.    [provider]  Lancet Device MISC Check BGs daily. Wants accucheck May substitute to any manufacturer covered by patient's insurance. 03/20/24   Jolinda Norene HERO, DO  Lancets Misc. MISC Check BGs daily. Wants accucheck May substitute to any manufacturer covered by patient's insurance. 03/20/24   Jolinda Norene HERO, DO  Misc Natural Products (OSTEO BI-FLEX ADV DOUBLE ST PO) Take 2 capsules by mouth daily.     [provider]  potassium chloride  SA (KLOR-CON  M) 20 MEQ tablet Take 1 tablet (20 mEq total) by mouth daily as needed (lasix ). 02/19/23   Gottschalk, Norene M, DO  UNABLE TO FIND systane eye drops    [provider]  vitamin C (ASCORBIC ACID) 500 MG tablet Take 500 mg by mouth daily.    [provider]  VITAMIN D  PO Take by mouth.    [provider]  zinc gluconate 50 MG tablet Take 50 mg by mouth daily.    [provider]    Allergies: Patient has no known allergies.    Review of Systems  All other systems reviewed and are negative.   Updated Vital Signs BP 131/76   Pulse (!) 54  Temp 97.9 F (36.6 C) (Oral)   Resp 18   Ht 5' 3 (1.6 m)   Wt 86.2 kg   SpO2 96%   BMI 33.66 kg/m   Physical Exam Vitals and nursing note reviewed.  Constitutional:      Appearance: Normal appearance.  Eyes:     Extraocular Movements: Extraocular movements intact.     Pupils: Pupils are equal, round, and reactive to light.  Neck:     Comments: Neck is grossly normal in appearance.  There is some tenderness over the left trapezius and into the left side of the neck.  Pain is worse with turning her head to the right.  There is no scalp tenderness or tenderness over the temporal artery. Pulmonary:     Effort: Pulmonary effort is normal.  Skin:    General:  Skin is warm and dry.  Neurological:     General: No focal deficit present.     Mental Status: She is alert and oriented to person, place, and time.     Cranial Nerves: No cranial nerve deficit.     Motor: No weakness.     (all labs ordered are listed, but only abnormal results are displayed) Labs Reviewed - No data to display  EKG: None  Radiology: No results found.   Procedures   Medications Ordered in the ED  predniSONE  (DELTASONE ) tablet 10 mg (has no administration in time range)  HYDROcodone -acetaminophen  (NORCO/VICODIN) 5-325 MG per tablet 2 tablet (has no administration in time range)                                    Medical Decision Making Risk Prescription drug management.   Patient presenting with neck pain I suspect is related to a pinched nerve or inflamed the muscle.  She is neurologically intact and I see no indications for emergent studies such as MRI.  My plan is to treat with an anti-inflammatory.  Since she has a solitary kidney, I feel as though NSAIDs are not appropriate.  She is a diet-controlled diabetic, but will try low-dose of prednisone  and see if this helps.  I will also prescribe medication she can take for pain in the meantime.  She is to follow-up with her primary doctor if not improving in the next week to discuss physical therapy versus MRI.     Final diagnoses:  None    ED Discharge Orders     None          Geroldine Berg, MD 03/30/24 [phone]

## 2024-04-04 DIAGNOSIS — S46812A Strain of other muscles, fascia and tendons at shoulder and upper arm level, left arm, initial encounter: Secondary | ICD-10-CM | POA: Diagnosis not present

## 2024-04-04 DIAGNOSIS — M62830 Muscle spasm of back: Secondary | ICD-10-CM | POA: Diagnosis not present

## 2024-04-07 ENCOUNTER — Ambulatory Visit: Admitting: Family Medicine

## 2024-04-07 ENCOUNTER — Encounter: Payer: Self-pay | Admitting: Family Medicine

## 2024-04-07 VITALS — BP 103/66 | HR 77 | Ht 63.0 in | Wt 191.0 lb

## 2024-04-07 DIAGNOSIS — S161XXD Strain of muscle, fascia and tendon at neck level, subsequent encounter: Secondary | ICD-10-CM

## 2024-04-07 MED ORDER — PREDNISONE 20 MG PO TABS: ORAL_TABLET | ORAL | 0 refills | Status: DC

## 2024-04-07 NOTE — Progress Notes (Signed)
 BP 103/66   Pulse 77   Ht 5' 3 (1.6 m)   Wt 191 lb (86.6 kg)   SpO2 97%   BMI 33.83 kg/m    Subjective:   Patient ID: Kristie Lewis, female    DOB: 09/04/1951, 73 y.o.   MRN: [id number]  HPI: Kristie Lewis is a 73 y.o. female presenting on 04/07/2024 for Neck Pain and Headache   HPI Left-sided neck pain Patient is coming in today for left-sided neck pain that has been bothering her over the past 3 weeks.  She says 2 weeks ago she had it started radiating up the left side of her scalp onto the left side of her forehead but mostly the worst of it is on the left side of her neck.  She was seen in the emergency department on 03/30/2024 and again in the urgent care on 04/04/2024.  She was given some prednisone  and hydrocodone  initially and it helped some but then it came back.  She was then given muscle relaxer from the urgent care.  She does feel like the pain radiates up into her scalp but then also radiates some down into her back as well.  She denies any numbness or weakness that she knows of.  Relevant past medical, surgical, family and social history reviewed and updated as indicated. Interim medical history since our last visit reviewed. Allergies and medications reviewed and updated.  Review of Systems  Constitutional:  Negative for chills and fever.  Eyes:  Negative for visual disturbance.  Respiratory:  Negative for chest tightness and shortness of breath.   Cardiovascular:  Negative for chest pain and leg swelling.  Musculoskeletal:  Positive for arthralgias and neck pain. Negative for back pain, gait problem and neck stiffness.  Skin:  Negative for color change and rash.  Neurological:  Negative for dizziness, light-headedness and headaches.  Psychiatric/Behavioral:  Negative for agitation and behavioral problems.   All other systems reviewed and are negative.   Per HPI unless specifically indicated above   Allergies as of 04/07/2024   No Known Allergies       Medication List        Accurate as of April 07, 2024  2:12 PM. If you have any questions, ask your nurse or doctor.          STOP taking these medications    B-COMPLEX/B-12 PO Stopped by: Fonda LABOR Batya Citron   HYDROcodone -acetaminophen  5-325 MG tablet Commonly known as: NORCO/VICODIN Stopped by: Fonda LABOR Avraham Benish       TAKE these medications    allopurinol  100 MG tablet Commonly known as: ZYLOPRIM  Take 1 tablet (100 mg total) by mouth daily.   ascorbic acid 500 MG tablet Commonly known as: VITAMIN C Take 500 mg by mouth daily.   aspirin EC 81 MG tablet Take 81 mg by mouth 2 (two) times daily.   atenolol  25 MG tablet Commonly known as: TENORMIN  Take 1 tablet (25 mg total) by mouth daily.   Blood Glucose Monitoring Suppl Devi Check BGs daily. Wants accucheck E11.69. May substitute to any manufacturer covered by patient's insurance.   BLOOD GLUCOSE TEST STRIPS Strp Check BGs daily. Wants accucheck May substitute to any manufacturer covered by patient's insurance.   colchicine  0.6 MG tablet TAKE 2 TABLETS AT THE ONSET OF ATTACK THEN 1 TAB EVERY 2 HOURS AS NEEDED UP TO 6 TABLETS THEN TAKE TWICE DAILY UNTIL SYMPTOMS CLEAR   furosemide  20 MG tablet Commonly known as: LASIX  Take 1  tablet (20 mg total) by mouth daily.   gabapentin  300 MG capsule Commonly known as: NEURONTIN  Take 1 capsule (300 mg total) by mouth at bedtime as needed.   Krill Oil 1000 MG Caps Take by mouth.   L-Lysine 1000 MG Tabs Take by mouth.   Lancet Device Misc Check BGs daily. Wants accucheck May substitute to any manufacturer covered by patient's insurance.   Lancets Misc. Misc Check BGs daily. Wants accucheck May substitute to any manufacturer covered by patient's insurance.   OSTEO BI-FLEX ADV DOUBLE ST PO Take 2 capsules by mouth daily.   potassium chloride  SA 20 MEQ tablet Commonly known as: KLOR-CON  M Take 1 tablet (20 mEq total) by mouth daily as needed (lasix ).    predniSONE  20 MG tablet Commonly known as: DELTASONE  Take 3 tabs daily for 1 week, then 2 tabs daily for week 2, then 1 tab daily for week 3. What changed:  medication strength how much to take how to take this when to take this additional instructions Changed by: Fonda A Zakyra [place]   UNABLE TO FIND systane eye drops   Vitamin B 12 100 MCG Lozg Take 1 Dose by mouth daily.   VITAMIN D  PO Take by mouth.   zinc gluconate 50 MG tablet Take 50 mg by mouth daily.         Objective:   BP 103/66   Pulse 77   Ht 5' 3 (1.6 m)   Wt 191 lb (86.6 kg)   SpO2 97%   BMI 33.83 kg/m   Wt Readings from Last 3 Encounters:  04/07/24 191 lb (86.6 kg)  03/30/24 190 lb (86.2 kg)  02/21/24 190 lb 12.8 oz (86.5 kg)    Physical Exam Vitals and nursing note reviewed.  Constitutional:      General: She is not in acute distress.    Appearance: She is well-developed. She is not diaphoretic.  Eyes:     Conjunctiva/sclera: Conjunctivae normal.  Cardiovascular:     Rate and Rhythm: Normal rate and regular rhythm.     Heart sounds: Normal heart sounds. No murmur heard. Pulmonary:     Effort: Pulmonary effort is normal. No respiratory distress.     Breath sounds: Normal breath sounds. No wheezing.  Musculoskeletal:        General: Normal range of motion.     Cervical back: Tenderness present. No swelling, deformity, erythema, signs of trauma, rigidity, spasms or bony tenderness. Pain with movement present.       Back:  Skin:    General: Skin is warm and dry.     Findings: No rash.  Neurological:     Mental Status: She is alert and oriented to person, place, and time.     Coordination: Coordination normal.  Psychiatric:        Behavior: Behavior normal.       Assessment & Plan:   Problem List Items Addressed This Visit   None Visit Diagnoses       Neck strain, subsequent encounter    -  Primary   Relevant Medications   predniSONE  (DELTASONE ) 20 MG tablet   Other  Relevant Orders   CT CERVICAL SPINE WO CONTRAST       Will give a slightly longer course of prednisone  and order a CT neck, we do not have x-ray here today so we cannot start there. Follow up plan: Return if symptoms worsen or fail to improve.  Counseling provided for all of the vaccine components Orders  Placed This Encounter  Procedures   CT CERVICAL SPINE WO CONTRAST    Fonda Levins, MD [facility] 04/07/2024, 2:12 PM

## 2024-04-29 ENCOUNTER — Ambulatory Visit (HOSPITAL_COMMUNITY)
Admission: RE | Admit: 2024-04-29 | Discharge: 2024-04-29 | Disposition: A | Discharge status: Home / Self Care | Source: Ambulatory Visit | Attending: Family Medicine | Admitting: Family Medicine

## 2024-04-29 DIAGNOSIS — M4802 Spinal stenosis, cervical region: Secondary | ICD-10-CM | POA: Diagnosis not present

## 2024-04-29 DIAGNOSIS — S161XXA Strain of muscle, fascia and tendon at neck level, initial encounter: Secondary | ICD-10-CM | POA: Diagnosis not present

## 2024-04-29 DIAGNOSIS — M4722 Other spondylosis with radiculopathy, cervical region: Secondary | ICD-10-CM | POA: Diagnosis not present

## 2024-04-29 DIAGNOSIS — S161XXD Strain of muscle, fascia and tendon at neck level, subsequent encounter: Secondary | ICD-10-CM | POA: Diagnosis not present

## 2024-04-29 DIAGNOSIS — M4312 Spondylolisthesis, cervical region: Secondary | ICD-10-CM | POA: Diagnosis not present

## 2024-05-01 ENCOUNTER — Other Ambulatory Visit: Payer: Self-pay | Admitting: *Deleted

## 2024-05-01 DIAGNOSIS — E119 Type 2 diabetes mellitus without complications: Secondary | ICD-10-CM | POA: Diagnosis not present

## 2024-05-01 MED ORDER — ACCU-CHEK GUIDE ME W/DEVICE KIT: PACK | 0 refills | Status: AC

## 2024-05-02 ENCOUNTER — Encounter: Payer: Self-pay | Admitting: Family Medicine

## 2024-05-05 ENCOUNTER — Telehealth: Payer: Self-pay | Admitting: Family Medicine

## 2024-05-05 ENCOUNTER — Ambulatory Visit: Payer: Self-pay

## 2024-05-05 NOTE — Telephone Encounter (Signed)
  FYI Only or Action Required?: Action required by provider: request for appointment and update on patient condition.  Patient was last seen in primary care on 04/07/2024 by Dettinger, Fonda LABOR, MD.  Called Nurse Triage reporting Headache.  Symptoms began today.  Interventions attempted: Prescription medications: steroid dose pak.  Symptoms are: gradually worsening.  Triage Disposition: See Physician Within 24 Hours  Patient/caregiver understands and will follow disposition?: Yes Copied from CRM #[id number]. Topic: Clinical - Medical Advice >> May 05, 2024  8:33 AM Diannia H wrote: Reason for CRM: Patient called to make an appointment with provider but nothing until Sep. Patient said her headaches are getting worse. She's already been seen, got a CAT scan, received medicine but noting is working. She would like to come in today. Could you please assist? Patients callback number is [phone] Reason for Disposition  [1] MODERATE headache (e.g., interferes with normal activities) AND [2] present > 24 hours AND [3] unexplained  (Exceptions: Pain medicines not tried, typical migraine, or headache part of viral illness.)  Answer Assessment - Initial Assessment Questions Patient's symptoms relieved by steroid dose pak, completed, symptoms now returning. Has agreed to use prescribed vicodin for pain until she can be seen on Monday Will go to UC/ED if symptoms worsen    1. LOCATION: Where does it hurt?      Base of skull on left side and extends to front of forehead 2. ONSET: When did the headache start? (e.g., minutes, hours, days)      Began July, 2025, patient went to ED on 7/17, given prednisone , 10 mg, but did not help pain.  Patient saw Dr. Maryanne on 7/25, given prednisone  taper which has helped.  As soon as the prednisone  ended, pain returned  4. SEVERITY: How bad is the pain? and What does it keep you from doing?  (e.g., Scale 1-10; mild, moderate, or severe)     8/10,  now only in neck 5. RECURRENT SYMPTOM: Have you ever had headaches before? If Yes, ask: When was the last time? and What happened that time?      denies 6. CAUSE: What do you think is causing the headache?     unsure 7. MIGRAINE: Have you been diagnosed with migraine headaches? If Yes, ask: Is this headache similar?      N/a 8. HEAD INJURY: Has there been any recent injury to your head?      denies 9. OTHER SYMPTOMS: Do you have any other symptoms? (e.g., fever, stiff neck, eye pain, sore throat, cold symptoms)     denies 10. PREGNANCY: Is there any chance you are pregnant? When was your last menstrual period?       N/A  Protocols used: Headache-A-AH

## 2024-05-05 NOTE — Telephone Encounter (Signed)
 Copied from CRM [id number]. Topic: Clinical - Medical Advice >> May 05, 2024  8:33 AM Diannia H wrote: Reason for CRM: Patient called to make an appointment with provider but nothing until Sep. Patient said her headaches are getting worse. She's already been seen, got a CAT scan, received medicine but noting is working. She would like to come in today. Could you please assist? Patients callback number is [phone]

## 2024-05-05 NOTE — Telephone Encounter (Signed)
Duplicate message . Will close this one.  

## 2024-05-05 NOTE — Telephone Encounter (Signed)
 Appt made.

## 2024-05-08 ENCOUNTER — Encounter: Payer: Self-pay | Admitting: Family Medicine

## 2024-05-08 ENCOUNTER — Ambulatory Visit (INDEPENDENT_AMBULATORY_CARE_PROVIDER_SITE_OTHER): Admitting: Family Medicine

## 2024-05-08 VITALS — BP 125/78 | HR 88 | Ht 63.0 in | Wt 191.0 lb

## 2024-05-08 DIAGNOSIS — M5412 Radiculopathy, cervical region: Secondary | ICD-10-CM | POA: Diagnosis not present

## 2024-05-08 MED ORDER — DICLOFENAC SODIUM 50 MG PO TBEC: 50.0000 mg | DELAYED_RELEASE_TABLET | Freq: Two times a day (BID) | ORAL | 1 refills | Status: DC

## 2024-05-08 NOTE — Progress Notes (Signed)
 BP 125/78   Pulse 88   Ht 5' 3 (1.6 m)   Wt 191 lb (86.6 kg)   SpO2 91%   BMI 33.83 kg/m    Subjective:   Patient ID: Clarita LELON Mako, female    DOB: Oct 11, 1950, 73 y.o.   MRN: [id number]  HPI: AHMIRA BOISSELLE is a 73 y.o. female presenting on 05/08/2024 for Headache (Persistent. Prednisone  helped while taking but HA returned. Pain starts at base of skull. Pt has not received )   Discussed the use of AI scribe software for clinical note transcription with the patient, who gave verbal consent to proceed.  History of Present Illness   VEDIKA DUMLAO is a 73 year old female who presents with neck pain.  She experiences neck pain originating at the base of her neck, primarily on the left side, extending to her shoulder. The pain is similar to previous episodes and has not moved to the front of her neck. Prednisone  previously improved her symptoms, but the pain returned shortly after tapering off the medication.  She reports that a CT scan of her neck was performed and that she was told there was moderate C6-C7 stenosis and a possible disc protrusion at the same level. She was informed that there is also borderline stenosis at C5-C6 with a slight anterior shift at this level. She was told that bone spurs or a disc pressing on a nerve may be present.  She has a history of knee surgeries and has received knee injections in the past, which provided temporary relief. She also has a bone spur on her heel and has received injections for her Achilles tendon.  She is currently taking baclofen, a muscle relaxer.          Relevant past medical, surgical, family and social history reviewed and updated as indicated. Interim medical history since our last visit reviewed. Allergies and medications reviewed and updated.  Review of Systems  Constitutional:  Negative for chills and fever.  HENT:  Negative for congestion, ear discharge and ear pain.   Eyes:  Negative for visual disturbance.   Respiratory:  Negative for chest tightness and shortness of breath.   Cardiovascular:  Negative for chest pain and leg swelling.  Genitourinary:  Negative for difficulty urinating and dysuria.  Musculoskeletal:  Positive for arthralgias, myalgias and neck pain. Negative for back pain and gait problem.  Skin:  Negative for rash.  Neurological:  Positive for headaches. Negative for light-headedness.  Psychiatric/Behavioral:  Negative for agitation and behavioral problems.   All other systems reviewed and are negative.   Per HPI unless specifically indicated above   Allergies as of 05/08/2024   No Known Allergies      Medication List        Accurate as of May 08, 2024  2:59 PM. If you have any questions, ask your nurse or doctor.          STOP taking these medications    predniSONE  20 MG tablet Commonly known as: DELTASONE  Stopped by: Fonda LABOR Jalaina Salyers       TAKE these medications    Accu-Chek Guide Me w/Device Kit Check BGs daily Dx E11.69   allopurinol  100 MG tablet Commonly known as: ZYLOPRIM  Take 1 tablet (100 mg total) by mouth daily.   ascorbic acid 500 MG tablet Commonly known as: VITAMIN C Take 500 mg by mouth daily.   aspirin EC 81 MG tablet Take 81 mg by mouth 2 (two) times daily.  atenolol  25 MG tablet Commonly known as: TENORMIN  Take 1 tablet (25 mg total) by mouth daily.   BLOOD GLUCOSE TEST STRIPS Strp Check BGs daily. Wants accucheck May substitute to any manufacturer covered by patient's insurance.   colchicine  0.6 MG tablet TAKE 2 TABLETS AT THE ONSET OF ATTACK THEN 1 TAB EVERY 2 HOURS AS NEEDED UP TO 6 TABLETS THEN TAKE TWICE DAILY UNTIL SYMPTOMS CLEAR   diclofenac  50 MG EC tablet Commonly known as: VOLTAREN  Take 1 tablet (50 mg total) by mouth 2 (two) times daily. Started by: Fonda LABOR Dustan Hyams   furosemide  20 MG tablet Commonly known as: LASIX  Take 1 tablet (20 mg total) by mouth daily.   gabapentin  300 MG  capsule Commonly known as: NEURONTIN  Take 1 capsule (300 mg total) by mouth at bedtime as needed.   Krill Oil 1000 MG Caps Take by mouth.   L-Lysine 1000 MG Tabs Take by mouth.   Lancet Device Misc Check BGs daily. Wants accucheck May substitute to any manufacturer covered by patient's insurance.   Lancets Misc. Misc Check BGs daily. Wants accucheck May substitute to any manufacturer covered by patient's insurance.   OSTEO BI-FLEX ADV DOUBLE ST PO Take 2 capsules by mouth daily.   potassium chloride  SA 20 MEQ tablet Commonly known as: KLOR-CON  M Take 1 tablet (20 mEq total) by mouth daily as needed (lasix ).   UNABLE TO FIND systane eye drops   Vitamin B 12 100 MCG Lozg Take 1 Dose by mouth daily.   VITAMIN D  PO Take by mouth.   zinc gluconate 50 MG tablet Take 50 mg by mouth daily.         Objective:   BP 125/78   Pulse 88   Ht 5' 3 (1.6 m)   Wt 191 lb (86.6 kg)   SpO2 91%   BMI 33.83 kg/m   Wt Readings from Last 3 Encounters:  05/08/24 191 lb (86.6 kg)  04/07/24 191 lb (86.6 kg)  03/30/24 190 lb (86.2 kg)    Physical Exam Vitals and nursing note reviewed.  Constitutional:      Appearance: She is well-developed.  Musculoskeletal:     Cervical back: Normal range of motion and neck supple. No rigidity.  Lymphadenopathy:     Cervical: No cervical adenopathy.  Neurological:     Mental Status: She is alert and oriented to person, place, and time.                 Assessment & Plan:   Problem List Items Addressed This Visit   None Visit Diagnoses       Cervical radiculopathy at C6    -  Primary   Relevant Medications   diclofenac  (VOLTAREN ) 50 MG EC tablet   Other Relevant Orders   Ambulatory referral to Orthopedic Surgery         Cervical radiculopathy due to C6-C7 stenosis and disc osteophyte complex Chronic cervical radiculopathy with symptoms recurring post-prednisone  taper. CT shows moderate C6-C7 stenosis, possible disc  herniation, and borderline stenosis at C5-C6. Discussed spinal injections as a non-surgical option, highlighting benefits and limitations. Emphasized avoiding surgery. Discussed diclofenac  use considering renal implications. - Refer to spinal specialist at [facility] for evaluation and possible spinal injections. - Prescribe low-dose diclofenac  (Voltaren ) with instructions to take with food and maintain hydration. - Monitor kidney function due to single kidney.          Follow up plan: Return if symptoms worsen or fail to improve.  Counseling provided for all of the vaccine components Orders Placed This Encounter  Procedures   Ambulatory referral to Orthopedic Surgery    Fonda Levins, MD [facility] 05/08/2024, 2:59 PM

## 2024-05-09 NOTE — Telephone Encounter (Signed)
 Looks like this was reviewed with the patient in the office per Dr. Williemae note

## 2024-05-17 ENCOUNTER — Encounter: Payer: Self-pay | Admitting: Family Medicine

## 2024-05-24 DIAGNOSIS — M47812 Spondylosis without myelopathy or radiculopathy, cervical region: Secondary | ICD-10-CM | POA: Diagnosis not present

## 2024-05-24 DIAGNOSIS — M5481 Occipital neuralgia: Secondary | ICD-10-CM | POA: Diagnosis not present

## 2024-06-06 DIAGNOSIS — S0501XA Injury of conjunctiva and corneal abrasion without foreign body, right eye, initial encounter: Secondary | ICD-10-CM | POA: Diagnosis not present

## 2024-06-07 DIAGNOSIS — S0501XA Injury of conjunctiva and corneal abrasion without foreign body, right eye, initial encounter: Secondary | ICD-10-CM | POA: Diagnosis not present

## 2024-06-07 LAB — OPHTHALMOLOGY REPORT-SCANNED

## 2024-06-26 ENCOUNTER — Other Ambulatory Visit: Payer: Self-pay | Admitting: Family Medicine

## 2024-06-26 DIAGNOSIS — N183 Chronic kidney disease, stage 3 unspecified: Secondary | ICD-10-CM

## 2024-06-29 ENCOUNTER — Telehealth: Payer: Self-pay

## 2024-06-29 NOTE — Telephone Encounter (Signed)
 FYI

## 2024-06-29 NOTE — Telephone Encounter (Unsigned)
 Copied from CRM [id number]. Topic: Referral - Status >> Jun 29, 2024  8:10 AM [name] wrote: Reason for CRM: Pt called back to report that she was already seen September 10th Dr. Laqueta for her neck. Message for PCP, Dr. Maryanne, and referrals.

## 2024-07-17 ENCOUNTER — Encounter: Payer: Self-pay | Admitting: Family Medicine

## 2024-07-17 DIAGNOSIS — N1831 Chronic kidney disease, stage 3a: Secondary | ICD-10-CM

## 2024-07-17 DIAGNOSIS — E1169 Type 2 diabetes mellitus with other specified complication: Secondary | ICD-10-CM

## 2024-07-17 DIAGNOSIS — E039 Hypothyroidism, unspecified: Secondary | ICD-10-CM

## 2024-07-17 DIAGNOSIS — E1122 Type 2 diabetes mellitus with diabetic chronic kidney disease: Secondary | ICD-10-CM

## 2024-08-14 ENCOUNTER — Encounter: Payer: Self-pay | Admitting: Family Medicine

## 2024-08-17 ENCOUNTER — Other Ambulatory Visit

## 2024-08-17 DIAGNOSIS — N183 Chronic kidney disease, stage 3 unspecified: Secondary | ICD-10-CM

## 2024-08-17 DIAGNOSIS — N1831 Chronic kidney disease, stage 3a: Secondary | ICD-10-CM

## 2024-08-17 DIAGNOSIS — E039 Hypothyroidism, unspecified: Secondary | ICD-10-CM

## 2024-08-17 DIAGNOSIS — E1169 Type 2 diabetes mellitus with other specified complication: Secondary | ICD-10-CM

## 2024-08-17 LAB — BAYER DCA HB A1C WAIVED: HB A1C (BAYER DCA - WAIVED): 6.1 % — ABNORMAL HIGH (ref 4.8–5.6)

## 2024-08-18 LAB — CBC WITH DIFFERENTIAL/PLATELET
Basophils Absolute: 0.1 x10E3/uL (ref 0.0–0.2)
Basos: 1 %
EOS (ABSOLUTE): 0.2 x10E3/uL (ref 0.0–0.4)
Eos: 5 %
Hematocrit: 38.4 % (ref 34.0–46.6)
Hemoglobin: 12.1 g/dL (ref 11.1–15.9)
Immature Grans (Abs): 0 x10E3/uL (ref 0.0–0.1)
Immature Granulocytes: 0 %
Lymphocytes Absolute: 1.4 x10E3/uL (ref 0.7–3.1)
Lymphs: 30 %
MCH: 29.3 pg (ref 26.6–33.0)
MCHC: 31.5 g/dL (ref 31.5–35.7)
MCV: 93 fL (ref 79–97)
Monocytes Absolute: 0.4 x10E3/uL (ref 0.1–0.9)
Monocytes: 8 %
Neutrophils Absolute: 2.6 x10E3/uL (ref 1.4–7.0)
Neutrophils: 56 %
Platelets: 225 x10E3/uL (ref 150–450)
RBC: 4.13 x10E6/uL (ref 3.77–5.28)
RDW: 13.6 % (ref 11.7–15.4)
WBC: 4.6 x10E3/uL (ref 3.4–10.8)

## 2024-08-18 LAB — COMPREHENSIVE METABOLIC PANEL WITH GFR
ALT: 31 IU/L (ref 0–32)
AST: 25 IU/L (ref 0–40)
Albumin: 4.2 g/dL (ref 3.8–4.8)
Alkaline Phosphatase: 100 IU/L (ref 49–135)
BUN/Creatinine Ratio: 19 (ref 12–28)
BUN: 17 mg/dL (ref 8–27)
Bilirubin Total: 0.4 mg/dL (ref 0.0–1.2)
CO2: 20 mmol/L (ref 20–29)
Calcium: 9.9 mg/dL (ref 8.7–10.3)
Chloride: 104 mmol/L (ref 96–106)
Creatinine, Ser: 0.91 mg/dL (ref 0.57–1.00)
Globulin, Total: 2.4 g/dL (ref 1.5–4.5)
Glucose: 141 mg/dL — ABNORMAL HIGH (ref 70–99)
Potassium: 4.3 mmol/L (ref 3.5–5.2)
Sodium: 141 mmol/L (ref 134–144)
Total Protein: 6.6 g/dL (ref 6.0–8.5)
eGFR: 67 mL/min/1.73 (ref >59)

## 2024-08-18 LAB — TSH+FREE T4
Free T4: 1.03 ng/dL (ref 0.82–1.77)
TSH: 3.48 u[IU]/mL (ref 0.450–4.500)

## 2024-08-18 LAB — LIPID PANEL
Chol/HDL Ratio: 6.6 ratio — ABNORMAL HIGH (ref 0.0–4.4)
Cholesterol, Total: 272 mg/dL — ABNORMAL HIGH (ref 100–199)
HDL: 41 mg/dL (ref >39)
LDL Chol Calc (NIH): 186 mg/dL — ABNORMAL HIGH (ref 0–99)
Triglycerides: 232 mg/dL — ABNORMAL HIGH (ref 0–149)
VLDL Cholesterol Cal: 45 mg/dL — ABNORMAL HIGH (ref 5–40)

## 2024-08-21 ENCOUNTER — Ambulatory Visit

## 2024-08-21 ENCOUNTER — Ambulatory Visit: Admitting: Family Medicine

## 2024-08-21 ENCOUNTER — Encounter: Payer: Self-pay | Admitting: Family Medicine

## 2024-08-21 VITALS — BP 102/60 | HR 69 | Temp 97.6°F | Ht 63.0 in | Wt 196.1 lb

## 2024-08-21 DIAGNOSIS — N1831 Chronic kidney disease, stage 3a: Secondary | ICD-10-CM

## 2024-08-21 DIAGNOSIS — G8929 Other chronic pain: Secondary | ICD-10-CM

## 2024-08-21 DIAGNOSIS — E1122 Type 2 diabetes mellitus with diabetic chronic kidney disease: Secondary | ICD-10-CM

## 2024-08-21 DIAGNOSIS — E559 Vitamin D deficiency, unspecified: Secondary | ICD-10-CM

## 2024-08-21 DIAGNOSIS — E1169 Type 2 diabetes mellitus with other specified complication: Secondary | ICD-10-CM

## 2024-08-21 DIAGNOSIS — E1159 Type 2 diabetes mellitus with other circulatory complications: Secondary | ICD-10-CM

## 2024-08-21 DIAGNOSIS — E039 Hypothyroidism, unspecified: Secondary | ICD-10-CM

## 2024-08-21 NOTE — Progress Notes (Signed)
 Subjective: CC:43mth f/u PCP: Jolinda Norene HERO, DO YEP:Gjwzu W Ghazarian is a 73 y.o. female presenting to clinic today for:  Patient reports that she has had a lot going on since our last visit.  She is accompanied today by her spouse who also provides some of the history.  She notes that she had some type of nerve irritation that occurred at the base of her scalp and went up to the top of her head on the left side.  She was put on quite a bit of corticosteroids and even an oral NSAID.  Sadly, none of these therapies it really helped with the symptoms and she ultimately saw her orthopedist at [facility] but by that time she was already feeling better.  She was discovered to have walking pneumonia shortly after treatment and she wonders if perhaps the Voltaren  contributed.  She has since discontinued that medication.  She again declines any pneumococcal or other vaccinations.  She admits to difficulty losing the weight that she gained while on the high-dose corticosteroids but admits that she has not modified her diet back down to a carb restricted diet.  She today reports some left-sided shoulder pain that is worse with trying to raise up her left upper extremity.  No preceding injury.  She also has some right posterior heel pain that is worse with certain positions but present pretty much always.  She denies any sensory changes, weakness etc.  She has a known left sided heel spur and was told by her specialist that she may need surgical intervention but she has held off on that for now because the recovery time is well over a year.  Regarding her diabetes, she had her eye exam done at [facility] in September.  She is compliant with antihypertensives.   ROS: Per HPI  No Known Allergies Past Medical History:  Diagnosis Date   Arthritis    Cataract    GERD (gastroesophageal reflux disease)    Gout    Hyperlipidemia    Hypertension    Microscopic colitis    Renal cancer (HCC) 2010     Current Outpatient Medications:    allopurinol  (ZYLOPRIM ) 100 MG tablet, Take 1 tablet (100 mg total) by mouth daily., Disp: 100 tablet, Rfl: 3   aspirin EC 81 MG tablet, Take 81 mg by mouth 2 (two) times daily., Disp: , Rfl:    atenolol  (TENORMIN ) 25 MG tablet, Take 1 tablet (25 mg total) by mouth daily., Disp: 100 tablet, Rfl: 3   Blood Glucose Monitoring Suppl (ACCU-CHEK GUIDE ME) w/Device KIT, Check BGs daily Dx E11.69, Disp: 1 kit, Rfl: 0   colchicine  0.6 MG tablet, TAKE 2 TABLETS AT THE ONSET OF ATTACK THEN 1 TAB EVERY 2 HOURS AS NEEDED UP TO 6 TABLETS THEN TAKE TWICE DAILY UNTIL SYMPTOMS CLEAR, Disp: 30 tablet, Rfl: PRN   Cyanocobalamin (VITAMIN B 12) 100 MCG LOZG, Take 1 Dose by mouth daily., Disp: , Rfl:    diclofenac  (VOLTAREN ) 50 MG EC tablet, Take 1 tablet (50 mg total) by mouth 2 (two) times daily., Disp: 60 tablet, Rfl: 1   furosemide  (LASIX ) 20 MG tablet, Take 1 tablet (20 mg total) by mouth daily., Disp: 100 tablet, Rfl: 3   gabapentin  (NEURONTIN ) 300 MG capsule, Take 1 capsule (300 mg total) by mouth at bedtime as needed., Disp: 90 capsule, Rfl: 3   Glucose Blood (BLOOD GLUCOSE TEST STRIPS) STRP, Check BGs daily. Wants accucheck May substitute to any manufacturer covered by patient's  insurance., Disp: 100 strip, Rfl: 3   KRILL OIL 1000 MG CAPS, Take by mouth., Disp: , Rfl:    L-Lysine 1000 MG TABS, Take by mouth., Disp: , Rfl:    Lancet Device MISC, Check BGs daily. Wants accucheck May substitute to any manufacturer covered by patient's insurance., Disp: 1 each, Rfl: 0   Lancets Misc. MISC, Check BGs daily. Wants accucheck May substitute to any manufacturer covered by patient's insurance., Disp: 100 each, Rfl: 4   Misc Natural Products (OSTEO BI-FLEX ADV DOUBLE ST PO), Take 2 capsules by mouth daily. , Disp: , Rfl:    potassium chloride  SA (KLOR-CON  M) 20 MEQ tablet, TAKE 1 TABLET BY MOUTH DAILY AS NEEDED, Disp: 90 tablet, Rfl: 0   UNABLE TO FIND, systane eye drops, Disp: ,  Rfl:    vitamin C (ASCORBIC ACID) 500 MG tablet, Take 500 mg by mouth daily., Disp: , Rfl:    VITAMIN D  PO, Take by mouth., Disp: , Rfl:    zinc gluconate 50 MG tablet, Take 50 mg by mouth daily., Disp: , Rfl:  Social History   Socioeconomic History   Marital status: Married    Spouse name: Guerrant   Number of children: 2   Years of education: Not on file   Highest education level: Not on file  Occupational History   Occupation: Retired    Comment: [employer]  Tobacco Use   Smoking status: Never   Smokeless tobacco: Never  Vaping Use   Vaping status: Never Used  Substance and Sexual Activity   Alcohol use: No   Drug use: No   Sexual activity: Not on file  Other Topics Concern   Not on file  Social History Narrative   Married x 25 years.    2 children   3 step-children   9 grandchildren   Social Drivers of Corporate Investment Banker Strain: Low Risk  (09/10/2023)   Overall Financial Resource Strain (CARDIA)    Difficulty of Paying Living Expenses: Not hard at all  Food Insecurity: No Food Insecurity (09/10/2023)   Hunger Vital Sign    Worried About Running Out of Food in the Last Year: Never true    Ran Out of Food in the Last Year: Never true  Transportation Needs: No Transportation Needs (09/10/2023)   PRAPARE - Administrator, Civil Service (Medical): No    Lack of Transportation (Non-Medical): No  Physical Activity: Insufficiently Active (09/10/2023)   Exercise Vital Sign    Days of Exercise per Week: 3 days    Minutes of Exercise per Session: 30 min  Stress: No Stress Concern Present (09/10/2023)   [employer] of Occupational Health - Occupational Stress Questionnaire    Feeling of Stress : Not at all  Social Connections: Moderately Isolated (09/10/2023)   Social Connection and Isolation Panel    Frequency of Communication with Friends and Family: More than three times a week    Frequency of Social Gatherings with Friends and Family: Three times a  week    Attends Religious Services: Never    Active Member of Clubs or Organizations: No    Attends Banker Meetings: Never    Marital Status: Married  Catering Manager Violence: Not At Risk (09/10/2023)   Humiliation, Afraid, Rape, and Kick questionnaire    Fear of Current or Ex-Partner: No    Emotionally Abused: No    Physically Abused: No    Sexually Abused: No   Family History  Problem Relation Age of Onset   Hyperlipidemia Mother    Stroke Mother    Diabetes Father    Heart disease Father    Hypertension Father    Stroke Father    Colon cancer Neg Hx    Esophageal cancer Neg Hx    Stomach cancer Neg Hx    Rectal cancer Neg Hx     Objective: Office vital signs reviewed. BP 102/60   Pulse 69   Temp 97.6 F (36.4 C)   Ht 5' 3 (1.6 m)   Wt 196 lb 2 oz (89 kg)   SpO2 97%   BMI 34.74 kg/m   Physical Examination:  General: Awake, alert, obese female, No acute distress HEENT: Sclera white.  Moist mucous membranes Cardio: regular rate and rhythm, S1S2 heard, no murmurs appreciated Pulm: clear to auscultation bilaterally, no wheezes, rhonchi or rales; normal work of breathing on room air MSK: Ambulating independently with fairly normal gait.  She has tenderness palpation along the insertion site of the right Achilles heel.  Pain is worse with plantarflexion. She has positive empty can sign on the left and positive Hawkins sign.  Tenderness palpation over the [facility] joint and anterior deltoid.  No palpable abnormalities in this area  No results found.   Assessment/ Plan: 73 y.o. female   Chronic left shoulder pain - Plan: DG Shoulder Left  Chronic heel pain, right - Plan: DG Foot Complete Right  Type 2 diabetes mellitus with other specified complication, without long-term current use of insulin (HCC) - Plan: Microalbumin/Creatinine Ratio, Urine, CMP14+EGFR, Bayer DCA Hb A1c Waived  Hypertension associated with stage 3 chronic kidney disease due to type 2  diabetes mellitus (HCC) - Plan: CMP14+EGFR, CBC with Differential/Platelet, Vitamin D , 25-hydroxy  Hyperlipidemia associated with type 2 diabetes mellitus (HCC) - Plan: CMP14+EGFR, Lipid panel  Acquired hypothyroidism - Plan: CMP14+EGFR, TSH + free T4  Vitamin D  deficiency - Plan: CMP14+EGFR, Vitamin D , 25-hydroxy   I suspect a component of AC joint arthritis and possible rotator tendinitis.  She certainly appears to have a level of frozen shoulder as a result of guarding this shoulder.  I highly recommended that she follow-up with her specialist at [facility] and I will attempt to send this note as well as x-ray results once they are available to them.  I personally reviewed her x-ray of the left shoulder and I do appreciate AC joint narrowing in that area as well as some irregularities along the humeral head laterally.  Uncertain as to what this represents  Fairly large heel spur noted along the posterior calcaneus of the right heel on x-ray.  She also has a small 1 on the plantar surface of the heel.  Sugar is controlled though has risen slightly since previous check up.  I suspect that there is some impact from the prednisone  that she was on.  We discussed the need to reduce carbohydrates and increase physical exercise as tolerated.  ROI for eye exam placed.  I have placed future orders in anticipation of her annual physical which will be in 6 months.  She may follow-up with me prior to that time if needed  Also despite recent walking pneumonia she declines pneumococcal vaccinations   Donaven Criswell CHRISTELLA Fielding, DO [facility] [phone]

## 2024-08-22 ENCOUNTER — Ambulatory Visit: Payer: Self-pay | Admitting: Family Medicine

## 2024-08-22 ENCOUNTER — Telehealth: Payer: Self-pay | Admitting: Family Medicine

## 2024-08-22 NOTE — Telephone Encounter (Signed)
 I'm glad to oblige that diagnosis being separated totally from her diabetes diagnosis.  Does not change management or recommendations

## 2024-08-22 NOTE — Telephone Encounter (Signed)
 Pt notified of removal. LS

## 2024-08-22 NOTE — Telephone Encounter (Unsigned)
 Copied from CRM [id number]. Topic: Clinical - Medical Advice >> Aug 22, 2024  9:33 AM Nathanel BROCKS wrote: Reason for CRM: Pt is not agreeing with a statement in yesterdays after visitit summary. The summary says, hypertention tention asssociated with stage 3 chronic kidney disease Pt states that is not correct, she has had kidney moved due to cancer.  Pt wants this removed from her medical chart. Please give pt a call back and advise. [phone].     ----------------------------------------------------------------------- From previous Reason for Contact - Billing Inquiry: Reason for CRM:

## 2024-08-23 ENCOUNTER — Ambulatory Visit: Payer: Self-pay | Admitting: Family Medicine

## 2024-08-25 ENCOUNTER — Ambulatory Visit: Payer: Self-pay | Admitting: Family Medicine

## 2024-09-19 DIAGNOSIS — N183 Chronic kidney disease, stage 3 unspecified: Secondary | ICD-10-CM

## 2024-09-26 ENCOUNTER — Ambulatory Visit

## 2024-09-26 VITALS — Ht 63.0 in | Wt 196.0 lb

## 2024-09-26 DIAGNOSIS — Z0000 Encounter for general adult medical examination without abnormal findings: Secondary | ICD-10-CM | POA: Diagnosis not present

## 2024-09-26 DIAGNOSIS — Z1231 Encounter for screening mammogram for malignant neoplasm of breast: Secondary | ICD-10-CM

## 2024-09-26 NOTE — Patient Instructions (Signed)
 Ms. Bolser,  Thank you for taking the time for your Medicare Wellness Visit. I appreciate your continued commitment to your health goals. Please review the care plan we discussed, and feel free to reach out if I can assist you further.  Please note that Annual Wellness Visits do not include a physical exam. Some assessments may be limited, especially if the visit was conducted virtually. If needed, we may recommend an in-person follow-up with your provider.  Ongoing Care Seeing your primary care provider every 3 to 6 months helps us  monitor your health and provide consistent, personalized care.   Referrals If a referral was made during today's visit and you haven't received any updates within two weeks, please contact the referred provider directly to check on the status.  You have an order for:  []   2D Mammogram  [x]   3D Mammogram  []   Bone Density     Please call for appointment:   [facility] [address] [place], [place] [address] [phone]  Make sure to wear two-piece clothing.  No lotions, powders, or deodorants the day of the appointment. Make sure to bring picture ID and insurance card.  Bring list of medications you are currently taking including any supplements.   Recommended Screenings:  Health Maintenance  Topic Date Due   COVID-19 Vaccine (1 - 2025-26 season) Never done   Osteoporosis screening with Bone Density Scan  10/12/2024   Zoster (Shingles) Vaccine (1 of 2) 11/19/2024*   Flu Shot  12/12/2024*   DTaP/Tdap/Td vaccine (3 - Tdap) 02/20/2025*   Pneumococcal Vaccine for age over 81 (1 of 2 - PCV) 02/20/2025*   Hemoglobin A1C  02/15/2025   Yearly kidney health urinalysis for diabetes  02/16/2025   Complete foot exam   02/20/2025   Eye exam for diabetics  06/07/2025   Yearly kidney function blood test for diabetes  08/17/2025   Medicare Annual Wellness Visit  09/26/2025   Breast Cancer Screening  10/18/2025   Colon Cancer  Screening  06/05/2026   Hepatitis C Screening  Completed   Meningitis B Vaccine  Aged Out  *Topic was postponed. The date shown is not the original due date.       09/26/2024   10:48 AM  Advanced Directives  Does Patient Have a Medical Advance Directive? No  Would patient like information on creating a medical advance directive? Yes (MAU/Ambulatory/Procedural Areas - Information given)   Information on Advanced Care Planning can be found at [place]  Secretary of [facility] Advance Health Care Directives Advance Health Care Directives ([email or web address]   Vision: Annual vision screenings are recommended for early detection of glaucoma, cataracts, and diabetic retinopathy. These exams can also reveal signs of chronic conditions such as diabetes and high blood pressure.  Dental: Annual dental screenings help detect early signs of oral cancer, gum disease, and other conditions linked to overall health, including heart disease and diabetes.  Please see the attached documents for additional preventive care recommendations.

## 2024-09-26 NOTE — Progress Notes (Signed)
 "  Chief Complaint  Patient presents with   Medicare Wellness     Subjective:   Kristie Lewis is a 74 y.o. female who presents for a Medicare Annual Wellness Visit.  Visit info / Clinical Intake: Medicare Wellness Visit Type:: Subsequent Annual Wellness Visit Persons participating in visit and providing information:: patient Medicare Wellness Visit Mode:: Telephone If telephone:: video declined Since this visit was completed virtually, some vitals may be partially provided or unavailable. Missing vitals are due to the limitations of the virtual format.: Documented vitals are patient reported If Telephone or Video please confirm:: I connected with patient using audio/video enable telemedicine. I verified patient identity with two identifiers, discussed telehealth limitations, and patient agreed to proceed. Patient Location:: home Provider Location:: office Interpreter Needed?: No Pre-visit prep was completed: yes AWV questionnaire completed by patient prior to visit?: no Living arrangements:: lives with spouse/significant other Patient's Overall Health Status Rating: good Typical amount of pain: some Does pain affect daily life?: no Are you currently prescribed opioids?: no  Dietary Habits and Nutritional Risks How many meals a day?: 3 Eats fruit and vegetables daily?: yes Most meals are obtained by: preparing own meals In the last 2 weeks, have you had any of the following?: none Diabetic:: (!) yes Any non-healing wounds?: no How often do you check your BS?: 1 Would you like to be referred to a Nutritionist or for Diabetic Management? : no  Functional Status Activities of Daily Living (to include ambulation/medication): Independent Ambulation: Independent Medication Administration: Independent Home Management (perform basic housework or laundry): Independent Manage your own finances?: yes Primary transportation is: driving Concerns about vision?: no *vision screening is  required for WTM* Concerns about hearing?: no  Fall Screening Falls in the past year?: 0 Number of falls in past year: 0 Was there an injury with Fall?: 0 Fall Risk Category Calculator: 0 Patient Fall Risk Level: Low Fall Risk  Fall Risk Patient at Risk for Falls Due to: No Fall Risks Fall risk Follow up: Falls evaluation completed; Education provided; Falls prevention discussed  Home and Transportation Safety: All rugs have non-skid backing?: yes All stairs or steps have railings?: yes Grab bars in the bathtub or shower?: yes Have non-skid surface in bathtub or shower?: yes Good home lighting?: yes Regular seat belt use?: yes Hospital stays in the last year:: no  Cognitive Assessment Difficulty concentrating, remembering, or making decisions? : no Will 6CIT or Mini Cog be Completed: no 6CIT or Mini Cog Declined: patient alert, oriented, able to answer questions appropriately and recall recent events  Advance Directives (For Healthcare) Does Patient Have a Medical Advance Directive?: No Would patient like information on creating a medical advance directive?: Yes (MAU/Ambulatory/Procedural Areas - Information given)  Reviewed/Updated  Reviewed/Updated: Reviewed All (Medical, Surgical, Family, Medications, Allergies, Care Teams, Patient Goals)    Allergies (verified) Voltaren  [diclofenac  sodium]   Current Medications (verified) Outpatient Encounter Medications as of 09/26/2024  Medication Sig   allopurinol  (ZYLOPRIM ) 100 MG tablet Take 1 tablet (100 mg total) by mouth daily.   aspirin EC 81 MG tablet Take 81 mg by mouth 2 (two) times daily.   atenolol  (TENORMIN ) 25 MG tablet Take 1 tablet (25 mg total) by mouth daily.   Blood Glucose Monitoring Suppl (ACCU-CHEK GUIDE ME) w/Device KIT Check BGs daily Dx E11.69   colchicine  0.6 MG tablet TAKE 2 TABLETS AT THE ONSET OF ATTACK THEN 1 TAB EVERY 2 HOURS AS NEEDED UP TO 6 TABLETS THEN TAKE TWICE DAILY  UNTIL SYMPTOMS CLEAR    Cyanocobalamin (VITAMIN B 12) 100 MCG LOZG Take 1 Dose by mouth daily.   furosemide  (LASIX ) 20 MG tablet Take 1 tablet (20 mg total) by mouth daily.   gabapentin  (NEURONTIN ) 300 MG capsule Take 1 capsule (300 mg total) by mouth at bedtime as needed.   Glucose Blood (BLOOD GLUCOSE TEST STRIPS) STRP Check BGs daily. Wants accucheck May substitute to any manufacturer covered by patient's insurance.   KRILL OIL 1000 MG CAPS Take by mouth.   L-Lysine 1000 MG TABS Take by mouth.   Lancet Device MISC Check BGs daily. Wants accucheck May substitute to any manufacturer covered by patient's insurance.   Lancets Misc. MISC Check BGs daily. Wants accucheck May substitute to any manufacturer covered by patient's insurance.   Misc Natural Products (OSTEO BI-FLEX ADV DOUBLE ST PO) Take 2 capsules by mouth daily.    potassium chloride  SA (KLOR-CON  M) 20 MEQ tablet TAKE 1 TABLET BY MOUTH DAILY AS NEEDED   UNABLE TO FIND systane eye drops   vitamin C (ASCORBIC ACID) 500 MG tablet Take 500 mg by mouth daily.   VITAMIN D  PO Take by mouth.   zinc gluconate 50 MG tablet Take 50 mg by mouth daily.   No facility-administered encounter medications on file as of 09/26/2024.    History: Past Medical History:  Diagnosis Date   Arthritis    Cataract    GERD (gastroesophageal reflux disease)    Gout    Heel spur, unspecified laterality    Hyperlipidemia    Hypertension    Microscopic colitis    Renal cancer (HCC) 2010   Past Surgical History:  Procedure Laterality Date   APPENDECTOMY     BREAST EXCISIONAL BIOPSY Right 1971   BREAST SURGERY     right breast bx   CESAREAN SECTION     3 c-sections   COLONOSCOPY     COSMETIC SURGERY  Nov,2023   eye lift   EYE SURGERY  2019,2020   HERNIA REPAIR     umbilical    JOINT REPLACEMENT  Jan 2023,Dec 2023   R) and L) Knees   NEPHRECTOMY Right    RIGHT KNEE ARTHROSCOPY     TUBAL LIGATION     UPPER GASTROINTESTINAL ENDOSCOPY     Family History  Problem  Relation Age of Onset   Hyperlipidemia Mother    Stroke Mother    Diabetes Father    Heart disease Father    Hypertension Father    Stroke Father    Colon cancer Neg Hx    Esophageal cancer Neg Hx    Stomach cancer Neg Hx    Rectal cancer Neg Hx    Social History   Occupational History   Occupation: Retired    Comment: [employer]  Tobacco Use   Smoking status: Never   Smokeless tobacco: Never  Vaping Use   Vaping status: Never Used  Substance and Sexual Activity   Alcohol use: No   Drug use: No   Sexual activity: Not on file   Tobacco Counseling Counseling given: Not Answered  SDOH Screenings   Food Insecurity: No Food Insecurity (09/26/2024)  Housing: Low Risk (09/26/2024)  Transportation Needs: No Transportation Needs (09/26/2024)  Utilities: Not At Risk (09/26/2024)  Alcohol Screen: Low Risk (09/10/2023)  Depression (PHQ2-9): Low Risk (09/26/2024)  Financial Resource Strain: Low Risk (09/10/2023)  Physical Activity: Insufficiently Active (09/26/2024)  Social Connections: Moderately Isolated (09/26/2024)  Stress: No Stress Concern Present (09/26/2024)  Tobacco  Use: Low Risk (09/26/2024)  Health Literacy: Adequate Health Literacy (09/26/2024)   See flowsheets for full screening details  Depression Screen PHQ 2 & 9 Depression Scale- Over the past 2 weeks, how often have you been bothered by any of the following problems? Little interest or pleasure in doing things: 0 Feeling down, depressed, or hopeless (PHQ Adolescent also includes...irritable): 0 PHQ-2 Total Score: 0 Trouble falling or staying asleep, or sleeping too much: 0 Feeling tired or having little energy: 0 Poor appetite or overeating (PHQ Adolescent also includes...weight loss): 0 Feeling bad about yourself - or that you are a failure or have let yourself or your family down: 0 Trouble concentrating on things, such as reading the newspaper or watching television (PHQ Adolescent also includes...like school work):  0 Moving or speaking so slowly that other people could have noticed. Or the opposite - being so fidgety or restless that you have been moving around a lot more than usual: 0 Thoughts that you would be better off dead, or of hurting yourself in some way: 0 PHQ-9 Total Score: 0 If you checked off any problems, how difficult have these problems made it for you to do your work, take care of things at home, or get along with other people?: Not difficult at all     Goals Addressed             This Visit's Progress    Remain active and independent   On track            Objective:    Today's Vitals   09/26/24 1047  Weight: 196 lb (88.9 kg)  Height: 5' 3 (1.6 m)   Body mass index is 34.72 kg/m.  Hearing/Vision screen Hearing Screening - Comments:: Patient is able to hear conversational tones without difficulty. No issues reported.   Vision Screening - Comments:: Wears rx glasses - up to date with routine eye exams with Dr. Maree @ [facility]  Immunizations and Health Maintenance Health Maintenance  Topic Date Due   COVID-19 Vaccine (1 - 2025-26 season) Never done   Bone Density Scan  10/12/2024   Zoster Vaccines- Shingrix (1 of 2) 11/19/2024 (Originally 12/22/2000)   Influenza Vaccine  12/12/2024 (Originally 04/14/2024)   DTaP/Tdap/Td (3 - Tdap) 02/20/2025 (Originally 04/10/2009)   Pneumococcal Vaccine: 50+ Years (1 of 2 - PCV) 02/20/2025 (Originally 12/22/1969)   HEMOGLOBIN A1C  02/15/2025   Diabetic kidney evaluation - Urine ACR  02/16/2025   FOOT EXAM  02/20/2025   OPHTHALMOLOGY EXAM  06/07/2025   Diabetic kidney evaluation - eGFR measurement  08/17/2025   Medicare Annual Wellness (AWV)  09/26/2025   Mammogram  10/18/2025   Colonoscopy  06/05/2026   Hepatitis C Screening  Completed   Meningococcal B Vaccine  Aged Out        Assessment/Plan:  This is a routine wellness examination for Kristie Lewis.  Patient Care Team: Jolinda Norene HERO, DO as PCP - General  (Family Medicine) Lynnell Nottingham, MD as Consulting Physician (Dermatology) Maree Lonni Inks, MD as Consulting Physician (Ophthalmology) Laqueta Ozell BIRCH, MD as Consulting Physician (Anesthesiology)  I have personally reviewed and noted the following in the patients chart:   Medical and social history Use of alcohol, tobacco or illicit drugs  Current medications and supplements including opioid prescriptions. Functional ability and status Nutritional status Physical activity Advanced directives List of other physicians Hospitalizations, surgeries, and ER visits in previous 12 months Vitals Screenings to include cognitive, depression, and falls Referrals and  appointments  No orders of the defined types were placed in this encounter.  In addition, I have reviewed and discussed with patient certain preventive protocols, quality metrics, and best practice recommendations. A written personalized care plan for preventive services as well as general preventive health recommendations were provided to patient.   Kristie Charmaine Browner, LPN   [id number]   Return in 1 year (on 09/26/2025).  After Visit Summary: (MyChart) Due to this being a telephonic visit, the after visit summary with patients personalized plan was offered to patient via MyChart   Nurse Notes: Patient advised to keep follow-up appointment with PCP (02/19/25 @ 9:00) HM Addressed: Mammogram ordered Diabetic eye exam results requested   "

## 2025-02-14 ENCOUNTER — Other Ambulatory Visit

## 2025-02-19 ENCOUNTER — Ambulatory Visit: Admitting: Family Medicine
